# Patient Record
Sex: Female | Born: 1950
Health system: Southern US, Community
[De-identification: ages and names within clinical notes are randomized; demographics above are authoritative.]

## PROBLEM LIST (undated history)

## (undated) DIAGNOSIS — I10 Essential (primary) hypertension: Secondary | ICD-10-CM

## (undated) DIAGNOSIS — I639 Cerebral infarction, unspecified: Secondary | ICD-10-CM

## (undated) DIAGNOSIS — T7840XA Allergy, unspecified, initial encounter: Secondary | ICD-10-CM

## (undated) DIAGNOSIS — K219 Gastro-esophageal reflux disease without esophagitis: Secondary | ICD-10-CM

## (undated) DIAGNOSIS — B029 Zoster without complications: Secondary | ICD-10-CM

## (undated) DIAGNOSIS — R002 Palpitations: Secondary | ICD-10-CM

## (undated) DIAGNOSIS — IMO0002 Reserved for concepts with insufficient information to code with codable children: Secondary | ICD-10-CM

## (undated) DIAGNOSIS — G459 Transient cerebral ischemic attack, unspecified: Secondary | ICD-10-CM

## (undated) DIAGNOSIS — E063 Autoimmune thyroiditis: Secondary | ICD-10-CM

## (undated) DIAGNOSIS — E785 Hyperlipidemia, unspecified: Secondary | ICD-10-CM

## (undated) HISTORY — DX: Cerebral infarction, unspecified: I63.9

## (undated) HISTORY — DX: Essential (primary) hypertension: I10

## (undated) HISTORY — DX: Allergy, unspecified, initial encounter: T78.40XA

## (undated) HISTORY — DX: Autoimmune thyroiditis: E06.3

## (undated) HISTORY — DX: Transient cerebral ischemic attack, unspecified: G45.9

## (undated) HISTORY — DX: Hyperlipidemia, unspecified: E78.5

## (undated) HISTORY — DX: Gastro-esophageal reflux disease without esophagitis: K21.9

## (undated) HISTORY — PX: TUBAL LIGATION: SHX77

## (undated) HISTORY — DX: Zoster without complications: B02.9

## (undated) HISTORY — DX: Reserved for concepts with insufficient information to code with codable children: IMO0002

## (undated) HISTORY — DX: Palpitations: R00.2

## (undated) HISTORY — PX: CHOLECYSTECTOMY: SHX55

---

## 1991-12-19 ENCOUNTER — Encounter: Payer: Self-pay | Admitting: Cardiology

## 1992-01-01 ENCOUNTER — Encounter: Payer: Self-pay | Admitting: Cardiology

## 1997-05-06 ENCOUNTER — Other Ambulatory Visit: Admission: RE | Admit: 1997-05-06 | Discharge: 1997-05-06 | Payer: Self-pay | Admitting: Family Medicine

## 1997-05-07 ENCOUNTER — Other Ambulatory Visit: Admission: RE | Admit: 1997-05-07 | Discharge: 1997-05-07 | Payer: Self-pay | Admitting: Family Medicine

## 1998-02-05 ENCOUNTER — Ambulatory Visit (HOSPITAL_COMMUNITY): Admission: RE | Admit: 1998-02-05 | Discharge: 1998-02-05 | Payer: Self-pay | Admitting: Gastroenterology

## 1998-05-13 ENCOUNTER — Other Ambulatory Visit: Admission: RE | Admit: 1998-05-13 | Discharge: 1998-05-13 | Payer: Self-pay | Admitting: Family Medicine

## 1999-06-04 ENCOUNTER — Other Ambulatory Visit: Admission: RE | Admit: 1999-06-04 | Discharge: 1999-06-04 | Payer: Self-pay | Admitting: Family Medicine

## 1999-06-10 ENCOUNTER — Encounter: Payer: Self-pay | Admitting: Family Medicine

## 1999-06-10 ENCOUNTER — Encounter: Admission: RE | Admit: 1999-06-10 | Discharge: 1999-06-10 | Payer: Self-pay | Admitting: Family Medicine

## 1999-06-15 ENCOUNTER — Encounter: Payer: Self-pay | Admitting: Family Medicine

## 1999-06-15 ENCOUNTER — Encounter: Admission: RE | Admit: 1999-06-15 | Discharge: 1999-06-15 | Payer: Self-pay | Admitting: Family Medicine

## 2000-07-01 ENCOUNTER — Other Ambulatory Visit: Admission: RE | Admit: 2000-07-01 | Discharge: 2000-07-01 | Payer: Self-pay | Admitting: Family Medicine

## 2001-06-16 ENCOUNTER — Encounter: Payer: Self-pay | Admitting: Family Medicine

## 2001-06-16 ENCOUNTER — Encounter: Admission: RE | Admit: 2001-06-16 | Discharge: 2001-06-16 | Payer: Self-pay | Admitting: Family Medicine

## 2001-08-28 ENCOUNTER — Other Ambulatory Visit: Admission: RE | Admit: 2001-08-28 | Discharge: 2001-08-28 | Payer: Self-pay | Admitting: Family Medicine

## 2002-08-24 ENCOUNTER — Other Ambulatory Visit: Admission: RE | Admit: 2002-08-24 | Discharge: 2002-08-24 | Payer: Self-pay | Admitting: Family Medicine

## 2002-08-28 ENCOUNTER — Encounter: Payer: Self-pay | Admitting: Family Medicine

## 2002-08-28 ENCOUNTER — Encounter: Admission: RE | Admit: 2002-08-28 | Discharge: 2002-08-28 | Payer: Self-pay | Admitting: Family Medicine

## 2003-08-28 ENCOUNTER — Other Ambulatory Visit: Admission: RE | Admit: 2003-08-28 | Discharge: 2003-08-28 | Payer: Self-pay | Admitting: Family Medicine

## 2003-09-05 ENCOUNTER — Encounter: Admission: RE | Admit: 2003-09-05 | Discharge: 2003-09-05 | Payer: Self-pay | Admitting: Family Medicine

## 2003-09-13 ENCOUNTER — Ambulatory Visit (HOSPITAL_COMMUNITY): Admission: RE | Admit: 2003-09-13 | Discharge: 2003-09-13 | Payer: Self-pay | Admitting: Family Medicine

## 2003-09-13 ENCOUNTER — Encounter (INDEPENDENT_AMBULATORY_CARE_PROVIDER_SITE_OTHER): Payer: Self-pay | Admitting: Cardiology

## 2004-09-11 ENCOUNTER — Other Ambulatory Visit: Admission: RE | Admit: 2004-09-11 | Discharge: 2004-09-11 | Payer: Self-pay | Admitting: Family Medicine

## 2004-09-12 ENCOUNTER — Encounter: Admission: RE | Admit: 2004-09-12 | Discharge: 2004-09-12 | Payer: Self-pay | Admitting: Family Medicine

## 2005-02-18 ENCOUNTER — Encounter: Admission: RE | Admit: 2005-02-18 | Discharge: 2005-02-18 | Payer: Self-pay | Admitting: Family Medicine

## 2005-10-08 ENCOUNTER — Other Ambulatory Visit: Admission: RE | Admit: 2005-10-08 | Discharge: 2005-10-08 | Payer: Self-pay | Admitting: Family Medicine

## 2006-05-20 ENCOUNTER — Encounter: Admission: RE | Admit: 2006-05-20 | Discharge: 2006-05-20 | Payer: Self-pay | Admitting: Family Medicine

## 2006-11-11 ENCOUNTER — Other Ambulatory Visit: Admission: RE | Admit: 2006-11-11 | Discharge: 2006-11-11 | Payer: Self-pay | Admitting: Family Medicine

## 2007-06-02 ENCOUNTER — Encounter: Admission: RE | Admit: 2007-06-02 | Discharge: 2007-06-02 | Payer: Self-pay | Admitting: Family Medicine

## 2007-11-17 ENCOUNTER — Other Ambulatory Visit: Admission: RE | Admit: 2007-11-17 | Discharge: 2007-11-17 | Payer: Self-pay | Admitting: Family Medicine

## 2008-06-10 ENCOUNTER — Encounter: Admission: RE | Admit: 2008-06-10 | Discharge: 2008-06-10 | Payer: Self-pay | Admitting: Gastroenterology

## 2008-07-22 ENCOUNTER — Encounter: Admission: RE | Admit: 2008-07-22 | Discharge: 2008-07-22 | Payer: Self-pay | Admitting: General Surgery

## 2008-07-24 ENCOUNTER — Ambulatory Visit (HOSPITAL_BASED_OUTPATIENT_CLINIC_OR_DEPARTMENT_OTHER): Admission: RE | Admit: 2008-07-24 | Discharge: 2008-07-25 | Payer: Self-pay | Admitting: General Surgery

## 2008-07-24 ENCOUNTER — Encounter (INDEPENDENT_AMBULATORY_CARE_PROVIDER_SITE_OTHER): Payer: Self-pay | Admitting: General Surgery

## 2009-05-20 ENCOUNTER — Encounter (INDEPENDENT_AMBULATORY_CARE_PROVIDER_SITE_OTHER): Payer: Self-pay | Admitting: *Deleted

## 2009-09-17 ENCOUNTER — Ambulatory Visit: Payer: Self-pay | Admitting: Cardiology

## 2009-09-17 DIAGNOSIS — R0602 Shortness of breath: Secondary | ICD-10-CM | POA: Insufficient documentation

## 2009-09-17 DIAGNOSIS — E669 Obesity, unspecified: Secondary | ICD-10-CM

## 2009-09-17 DIAGNOSIS — I4949 Other premature depolarization: Secondary | ICD-10-CM

## 2009-10-16 ENCOUNTER — Encounter: Admission: RE | Admit: 2009-10-16 | Discharge: 2009-10-16 | Payer: Self-pay | Admitting: Family Medicine

## 2009-10-17 ENCOUNTER — Ambulatory Visit: Payer: Self-pay | Admitting: Cardiology

## 2009-10-17 ENCOUNTER — Ambulatory Visit: Payer: Self-pay

## 2009-11-03 ENCOUNTER — Ambulatory Visit: Payer: Self-pay | Admitting: Internal Medicine

## 2009-11-03 ENCOUNTER — Ambulatory Visit (HOSPITAL_COMMUNITY): Admission: RE | Admit: 2009-11-03 | Discharge: 2009-11-03 | Payer: Self-pay | Admitting: Cardiology

## 2009-11-03 ENCOUNTER — Encounter: Payer: Self-pay | Admitting: Cardiology

## 2009-11-03 ENCOUNTER — Ambulatory Visit: Payer: Self-pay

## 2009-11-06 ENCOUNTER — Ambulatory Visit: Payer: Self-pay | Admitting: Cardiology

## 2010-03-10 NOTE — Assessment & Plan Note (Signed)
Summary: Katie Allen   Visit Type:  Initial Consult Primary Provider:  Belva Agee, NP  CC:  Fatigue.  History of Present Illness: The patient presents for evaluation of fatigue. She also is concerned because her twin brother was recently diagnosed with a dilated cardiomyopathy after a syncopal episode. This was in another state. Her cardiac history has included PVCs documented on monitors in the past. She's had some mild bowing of the mitral valve without regurgitation in the past. However, she is otherwise had no significant cardiac abnormalities. She doesn't exercise routinely. However, doing activities such as vacuuming she has noticed that she is more fatigued over the past few months. She might get some leg tiredness as well. She does get some mild dyspnea with moderate activity but does not report resting shortness of breath, PND or orthopnea. She does not have palpitations, presyncope or syncope. She does not have chest pressure, neck or arm discomfort.  Current Medications (verified): 1)  Lisinopril-Hydrochlorothiazide 20-12.5 Mg Tabs (Lisinopril-Hydrochlorothiazide) .Marland Kitchen.. 1 By Mouth Daily 2)  Lipitor 10 Mg Tabs (Atorvastatin Calcium) .Marland Kitchen.. 1 By Mouth Daily 3)  Synthroid 50 Mcg Tabs (Levothyroxine Sodium) .Marland Kitchen.. 1 By Mouth Daily Alternating 1/2 By Mouth Daily 4)  Prilosec 20 Mg Cpdr (Omeprazole) .Marland Kitchen.. 1 By Mouth Daily 5)  Aspirin 81 Mg  Tabs (Aspirin) .Marland Kitchen.. 1 By Mouth Daily 6)  Caltrate 600+d 600-400 Mg-Unit Tabs (Calcium Carbonate-Vitamin D) .Marland Kitchen.. 1 Po Daily 7)  Vitamin C 500 Mg  Tabs (Ascorbic Acid) .Marland Kitchen.. 1 By Mouth Daily 8)  Vitamin B Complex-C   Caps (B Complex-C) .Marland Kitchen.. 1 By Mouth Daily 9)  Flax   Oil (Flaxseed (Linseed)) .Marland Kitchen.. 1 By Mouth Daily 10)  Vitamin D 400 Unit Tabs (Cholecalciferol) .... 2 By Mouth Daily  Allergies (verified): 1)  ! Penicillin  Past History:  Past Medical History: HTN x years Hyperlipidemia x years Hashimotos Thyroiditis Shingles  Past Surgical  History: Cholecystectomy  Family History: Father MI age 52, CABG in 106s Mother Colon CA Brother with cardiomyopathy age 31 Son with vasovagal syncope  Social History: Married , 48 year old son Never smoked Social EtOH  Review of Systems       Positive for joint pains, Jae Dire and dizziness with orthostatic symptoms, reflux. Otherwise as stated in the history of present illness and negative for all other systems.  Vital Signs:  Patient profile:   60 year old female Height:      71 inches Weight:      224 pounds BMI:     31.35 Pulse rate:   93 / minute Resp:     16 per minute BP sitting:   148 / 90  (right arm)  Vitals Entered By: Marrion Coy, CNA (September 17, 2009 3:02 PM)  Physical Exam  General:  Well developed, well nourished, in no acute distress. Head:  normocephalic and atraumatic Eyes:  PERRLA/EOM intact; conjunctiva and lids normal. Mouth:  Teeth, gums and palate normal. Oral mucosa normal. Neck:  Neck supple, no JVD. No masses, thyromegaly or abnormal cervical nodes. Chest Wall:  no deformities or breast masses noted Lungs:  Clear bilaterally to auscultation and percussion. Abdomen:  Bowel sounds positive; abdomen soft and non-tender without masses, organomegaly, or hernias noted. No hepatosplenomegaly. Msk:  Back normal, normal gait. Muscle strength and tone normal. Extremities:  No clubbing or cyanosis. Neurologic:  Alert and oriented x 3. Skin:  Intact without lesions or rashes. Cervical Nodes:  no significant adenopathy Axillary Nodes:  no significant adenopathy  Inguinal Nodes:  no significant adenopathy Psych:  Normal affect.   Detailed Cardiovascular Exam  Neck    Carotids: Carotids full and equal bilaterally without bruits.      Neck Veins: Normal, no JVD.    Heart    Inspection: no deformities or lifts noted.      Palpation: normal PMI with no thrills palpable.      Auscultation: regular rate and rhythm, S1, S2 without murmurs, rubs, gallops, or  clicks.    Vascular    Abdominal Aorta: no palpable masses, pulsations, or audible bruits.      Femoral Pulses: normal femoral pulses bilaterally.      Pedal Pulses: normal pedal pulses bilaterally.      Radial Pulses: normal radial pulses bilaterally.      Peripheral Circulation: no clubbing, cyanosis, or edema noted with normal capillary refill.     EKG  Procedure date:  09/17/2009  Findings:      sinus rhythm, rate 93, axis within normal limits, intervals within normal limits, sinus arrhythmia, no acute ST-T wave changes, premature ventricular contractions  Impression & Recommendations:  Problem # 1:  DYSPNEA (ICD-786.05) The patient has significant risk factors with a family history of coronary disease. Still I think the pretest probability of obstructive coronary disease is only low at best.  Exercise treadmill testing is indicated. This will allow me to risk stratify, rule out obstructive coronary disease and most importantly provide an exercise prescription. Orders: EKG w/ Interpretation (93000) Treadmill (Treadmill)  Problem # 2:  PREMATURE VENTRICULAR CONTRACTIONS (ICD-427.69) These are not particularly symptomatic. No change in therapy is indicated.  Problem # 3:  OBESITY, UNSPECIFIED (ICD-278.00) We discussed weight loss and I will give more specific suggestions at the time of the treadmill.  Problem # 4:  Family History of Cardiomyopathy I see no physical evidence of cardiomyopathy in this patient. She's had previous echocardiograms though a few years ago. I do not think further testing other than above is indicated.  Patient Instructions: 1)  Your physician recommends that you schedule a follow-up appointment at the time of the treadmill 2)  Your physician recommends that you continue on your current medications as directed. Please refer to the Current Medication list given to you today. 3)  Your physician has requested that you have an exercise tolerance test.  For  further information please visit [email or web address]  Please also follow instruction sheet, as given.

## 2010-03-10 NOTE — Letter (Signed)
Summary: [facility]  [facility]   Imported By: Roderic Ovens 09/26/2009 12:44:51  _____________________________________________________________________  External Attachment:    Type:   Image     Comment:   External Document

## 2010-03-10 NOTE — Letter (Signed)
Summary: [facility]   [facility]   Imported By: Roderic Ovens 09/26/2009 12:43:23  _____________________________________________________________________  External Attachment:    Type:   Image     Comment:   External Document

## 2010-03-10 NOTE — Assessment & Plan Note (Signed)
Summary: [facility]   Visit Type:  Follow-up Primary Provider:  Belva Agee, NP  CC:  Abnormal ETT.  History of Present Illness: The patient returns for followup of an abnormal stress test. She had a family history of sudden cardiac death in a brother who now has a defibrillator. I put her on a treadmill and other was no evidence of ischemia I was surprised to see how dyspneic she was in stage I and how rapidly she reached her target heart rate. She also had an accelerated blood pressure response. She did have an echocardiogram couple of days ago which demonstrates normal left and right ventricular function with no valvular abnormalities or significant findings. She does not have any chest pressure consistent with angina, neck or arm discomfort. She doesn't have any resting shortness of breath and denies any PND or orthopnea. She admittedly is sedentary and doesn't exercise at all. She is overweight and feels that she is deconditioned.  Current Medications (verified): 1)  Lisinopril-Hydrochlorothiazide 20-12.5 Mg Tabs (Lisinopril-Hydrochlorothiazide) .Marland Kitchen.. 1 By Mouth Daily 2)  Lipitor 10 Mg Tabs (Atorvastatin Calcium) .Marland Kitchen.. 1 By Mouth Daily 3)  Synthroid 50 Mcg Tabs (Levothyroxine Sodium) .Marland Kitchen.. 1 By Mouth Daily Alternating 1/2 By Mouth Daily 4)  Prilosec 20 Mg Cpdr (Omeprazole) .Marland Kitchen.. 1 By Mouth Daily 5)  Aspirin 81 Mg  Tabs (Aspirin) .Marland Kitchen.. 1 By Mouth Daily 6)  Caltrate 600+d 600-400 Mg-Unit Tabs (Calcium Carbonate-Vitamin D) .Marland Kitchen.. 1 Po Daily 7)  Vitamin C 500 Mg  Tabs (Ascorbic Acid) .Marland Kitchen.. 1 By Mouth Daily 8)  Vitamin B Complex-C   Caps (B Complex-C) .Marland Kitchen.. 1 By Mouth Daily 9)  Flax   Oil (Flaxseed (Linseed)) .Marland Kitchen.. 1 By Mouth Daily 10)  Vitamin D 400 Unit Tabs (Cholecalciferol) .... 2 By Mouth Daily 11)  Prempro 0.3-1.5 Mg Tabs (Conj Estrog-Medroxyprogest Ace) .Marland Kitchen.. 1 By Mouth Daily 12)  Multivitamins   Tabs (Multiple Vitamin) .Marland Kitchen.. 1 By Mouth Daily  Allergies (verified): 1)  !  Penicillin  Past History:  Past Medical History: Reviewed history from 09/17/2009 and no changes required. HTN x years Hyperlipidemia x years Hashimotos Thyroiditis Shingles  Past Surgical History: Reviewed history from 09/17/2009 and no changes required. Cholecystectomy  Review of Systems       As stated in the HPI and negative for all other systems.   Vital Signs:  Patient profile:   60 year old female Height:      71 inches Weight:      228 pounds BMI:     31.91 Pulse rate:   84 / minute Resp:     16 per minute BP sitting:   131 / 92  (right arm)  Vitals Entered By: Marrion Coy, CNA (November 06, 2009 11:46 AM)  Physical Exam  General:  Well developed, well nourished, in no acute distress. Head:  normocephalic and atraumatic Neck:  Neck supple, no JVD. No masses, thyromegaly or abnormal cervical nodes. Chest Wall:  no deformities Lungs:  Clear bilaterally to auscultation and percussion. Abdomen:  Bowel sounds positive; abdomen soft and non-tender without masses, organomegaly, or hernias noted. No hepatosplenomegaly. Msk:  Back normal, normal gait. Muscle strength and tone normal. Extremities:  No clubbing or cyanosis. Neurologic:  Alert and oriented x 3. Skin:  Intact without lesions or rashes. Cervical Nodes:  no significant adenopathy Inguinal Nodes:  no significant adenopathy Psych:  Normal affect.   Detailed Cardiovascular Exam  Neck    Carotids: Carotids full and equal bilaterally without bruits.  Neck Veins: Normal, no JVD.    Heart    Inspection: no deformities or lifts noted.      Palpation: normal PMI with no thrills palpable.      Auscultation: regular rate and rhythm, S1, S2 without murmurs, rubs, gallops, or clicks.    Vascular    Abdominal Aorta: no palpable masses, pulsations, or audible bruits.      Femoral Pulses: normal femoral pulses bilaterally.      Pedal Pulses: normal pedal pulses bilaterally.      Radial Pulses: normal  radial pulses bilaterally.      Peripheral Circulation: no clubbing, cyanosis, or edema noted with normal capillary refill.     Impression & Recommendations:  Problem # 1:  DYSPNEA (ICD-786.05) The patient's echo was normal. She has no significant symptoms. At this point I don't think further cardiovascular testing is suggested but I would like to reevaluate her if she has any future symptoms. Otherwise if there are no symptoms I will repeat a exercise tolerance test in one year. In the meantime we have discussed specific exercise regimen and plans to try to improve her conditioning. If she gets worse rather than better during this time she would need to come back and see me.  Problem # 2:  PREMATURE VENTRICULAR CONTRACTIONS (ICD-427.69)  Her updated medication list for this problem includes:    Lisinopril-hydrochlorothiazide 20-12.5 Mg Tabs (Lisinopril-hydrochlorothiazide) .Marland Kitchen... 1 by mouth daily    Aspirin 81 Mg Tabs (Aspirin) .Marland Kitchen... 1 by mouth daily  Problem # 3:  OBESITY, UNSPECIFIED (ICD-278.00) We discussed at length diet and exercise.  Patient Instructions: 1)  Your physician recommends that you schedule a follow-up appointment in 1 year for a treadmill 2)  Your physician recommends that you continue on your current medications as directed. Please refer to the Current Medication list given to you today.

## 2010-03-10 NOTE — Assessment & Plan Note (Signed)
Summary: per check f/u echo/saf   Current Medications (verified): 1)  Lisinopril-Hydrochlorothiazide 20-12.5 Mg Tabs (Lisinopril-Hydrochlorothiazide) .Marland Kitchen.. 1 By Mouth Daily 2)  Lipitor 10 Mg Tabs (Atorvastatin Calcium) .Marland Kitchen.. 1 By Mouth Daily 3)  Synthroid 50 Mcg Tabs (Levothyroxine Sodium) .Marland Kitchen.. 1 By Mouth Daily Alternating 1/2 By Mouth Daily 4)  Prilosec 20 Mg Cpdr (Omeprazole) .Marland Kitchen.. 1 By Mouth Daily 5)  Aspirin 81 Mg  Tabs (Aspirin) .Marland Kitchen.. 1 By Mouth Daily 6)  Caltrate 600+d 600-400 Mg-Unit Tabs (Calcium Carbonate-Vitamin D) .Marland Kitchen.. 1 Po Daily 7)  Vitamin C 500 Mg  Tabs (Ascorbic Acid) .Marland Kitchen.. 1 By Mouth Daily 8)  Vitamin B Complex-C   Caps (B Complex-C) .Marland Kitchen.. 1 By Mouth Daily 9)  Flax   Oil (Flaxseed (Linseed)) .Marland Kitchen.. 1 By Mouth Daily 10)  Vitamin D 400 Unit Tabs (Cholecalciferol) .... 2 By Mouth Daily 11)  Prempro 0.3-1.5 Mg Tabs (Conj Estrog-Medroxyprogest Ace) .Marland Kitchen.. 1 By Mouth Daily 12)  Multivitamins   Tabs (Multiple Vitamin) .Marland Kitchen.. 1 By Mouth Daily  Allergies (verified): 1)  ! Penicillin  Past History:  Past Medical History: Last updated: 09/17/2009 HTN x years Hyperlipidemia x years Hashimotos Thyroiditis Shingles  Vital Signs:  Patient profile:   60 year old female Height:      71 inches Weight:      228 pounds BMI:     31.91 Pulse rate:   84 / minute Resp:     16 per minute BP sitting:   131 / 92  (right arm)  Vitals Entered By: Marrion Coy, CNA (November 06, 2009 10:50 AM)

## 2010-03-10 NOTE — Letter (Signed)
Summary: Appointment - Missed  [facility]     [place], [place]    Phone:   Fax:      May 20, 2009 MRN: [id number]   VALORIA TAMBURRI [address] [place]  [address]   Dear Ms. Sanjurjo,  Our records indicate you missed your appointment on 05-14-2009   with  Dr. Antoine Poche  It is very important that we reach you to reschedule this appointment. We look forward to participating in your health care needs. Please contact us at the number listed above at your earliest convenience to reschedule this appointment.     Sincerely,      Lorne Skeens  [facility] Scheduling Team

## 2010-05-06 ENCOUNTER — Encounter: Payer: Self-pay | Admitting: Nurse Practitioner

## 2010-05-06 DIAGNOSIS — E063 Autoimmune thyroiditis: Secondary | ICD-10-CM | POA: Insufficient documentation

## 2010-05-06 DIAGNOSIS — R232 Flushing: Secondary | ICD-10-CM

## 2010-05-06 DIAGNOSIS — I10 Essential (primary) hypertension: Secondary | ICD-10-CM | POA: Insufficient documentation

## 2010-05-06 DIAGNOSIS — E039 Hypothyroidism, unspecified: Secondary | ICD-10-CM | POA: Insufficient documentation

## 2010-05-06 DIAGNOSIS — E785 Hyperlipidemia, unspecified: Secondary | ICD-10-CM | POA: Insufficient documentation

## 2010-05-06 DIAGNOSIS — E1169 Type 2 diabetes mellitus with other specified complication: Secondary | ICD-10-CM | POA: Insufficient documentation

## 2010-05-06 DIAGNOSIS — H8110 Benign paroxysmal vertigo, unspecified ear: Secondary | ICD-10-CM

## 2010-05-18 LAB — COMPREHENSIVE METABOLIC PANEL
ALT: 26 U/L (ref 0–35)
AST: 22 U/L (ref 0–37)
Albumin: 4 g/dL (ref 3.5–5.2)
CO2: 27 mEq/L (ref 19–32)
Calcium: 9.5 mg/dL (ref 8.4–10.5)
Creatinine, Ser: 0.68 mg/dL (ref 0.4–1.2)
GFR calc Af Amer: >60 mL/min (ref >60)
GFR calc non Af Amer: >60 mL/min (ref >60)
Sodium: 140 mEq/L (ref 135–145)
Total Protein: 6.6 g/dL (ref 6.0–8.3)

## 2010-05-18 LAB — CBC
MCHC: 33.8 g/dL (ref 30.0–36.0)
Platelets: 250 10*3/uL (ref 150–400)
RBC: 4.62 MIL/uL (ref 3.87–5.11)

## 2010-05-18 LAB — DIFFERENTIAL
Eosinophils Absolute: 0.2 10*3/uL (ref 0.0–0.7)
Eosinophils Relative: 2 % (ref 0–5)
Lymphocytes Relative: 40 % (ref 12–46)
Lymphs Abs: 3 10*3/uL (ref 0.7–4.0)
Monocytes Absolute: 0.4 10*3/uL (ref 0.1–1.0)
Monocytes Relative: 5 % (ref 3–12)

## 2010-06-23 NOTE — Op Note (Signed)
NAME:  Katie Allen             ACCOUNT NO.:  [id number]   MEDICAL RECORD NO.:  [id number]          PATIENT TYPE:  AMB   LOCATION:  [facility]                         FACILITY:  [facility]   PHYSICIAN:  Anselm Pancoast. Weatherly, M.D.DATE OF BIRTH:  23-Jul-1950   DATE OF PROCEDURE:  07/24/2008  DATE OF DISCHARGE:                               OPERATIVE REPORT   PREOPERATIVE DIAGNOSIS:  Chronic cholecystitis with stones.   POSTOPERATIVE DIAGNOSIS:  Chronic cholecystitis with stones.   OPERATION:  Laparoscopic cholecystectomy with cholangiogram.   ANESTHESIA:  General anesthesia.   HISTORY:  Katie Allen is a patient about 60 years old female  referred to me by the courtesy of Dr. Matthias Hughs, who had seen her referred  by Vernon Prey, her regular physician after episodes of epigastric pain.  She has had episodes of pain, previously had an ultrasound that did not  show gallstones and then recently with recurrent episodes, she had a  repeat ultrasound that showed multiple small stones within her  gallbladder.  She had a small bile duct on the ultrasound but when I  talked with her and I saw her about 3 or 4 weeks ago, she was having  episodes of pain and to me sounded as if she possibly could be passing  little stones.  However, her niece was getting married which she was  involved with that she definitely wanted to wait before until after the  wedding for the cholecystectomy.  She has been on a low-fat diet and  comes in today, repeat liver function studies are unremarkable.  Chest x-  ray and EKG was unremarkable.  She was given 400 mg of Cipro  intravenously and taken back to the operative suite.  The patient time-  out was performed after induction of general anesthesia.  They did have  to use the glide slide for placement in the endotracheal tube and Dr.  __________ placed this.  The abdomen was prepped with Betadine surgical  scrub and solution and draped in a sterile manner.  A small incision  was  made below the umbilicus.  The fascia was identified.  Small opening  made.  The underlying peritoneum was opened with a hemostat.  Pursestring suture of 0 Vicryl was placed and the Hasson cannula  introduced.  The gallbladder was distended but it did not have adhesions  around it.  The upper 10 mL trocar was placed in the subxiphoid area  after anesthetizing the fascia and the two lateral 5 mm trocars were  placed.  The gallbladder was retracted upward and outward.  There were  no adhesions and the peritoneum was encompassed I could see the little  cystic artery that was separated from the cystic duct, triply clipped  proximally, singly distally and divided it and then I encompassed the  cystic duct and put a clip the junction of the cystic duct and  gallbladder.  A small opening was made proximally, a Cook catheter was  introduced and upon opening the little cystic duct, there were 6 or 7  little about 1 mm stones expressed out of the cystic duct.  The  cholangiogram was inserted.  X-ray was obtained.  The common bile duct  is mildly dilated now compared to when she had the ultrasound but the  flow of the bile dye does go into the duodenum and I think that if there  is any little stones in the cystic duct, they should pass.  The catheter  was removed.  You could see the left and right radicals of the common  biliary system and I triply clipped the cystic duct, divided it.  Then  identified the posterior branch of the cystic artery.  This was clipped  and divided distal to the clips and then the gallbladder freed from its  bed.  Good hemostasis was obtained and we placed the gallbladder within  an EndoCatch bag.  The gallbladder and the bag was then removed from the  umbilical port.  I had thoroughly irrigated, aspirated and checked the  clips and they were all in good position, no bleeding.  I put an  additional figure-of-eight of 0 Vicryl in the fascia at the umbilicus  and then  anesthetized the fascia at the umbilicus.  All the ports had  been anesthetized prior to port placement.  Dr. Donell Beers, who was supposed  to assist, showed up really about the time the gallbladder was freed and  did not scrubbed in as things were going nicely.  We did put a single  figure-of-eight single fascia stitch in the subxiphoid port for  hemostasis and a little bit more Marcaine with adrenalin and then the  subcutaneous wounds were closed with 4-0 Vicryl.  Benzoin and Steri-  Strips on the skin. I think the patient is planning to spend the night.  All total about I think 20 mL of the Marcaine with adrenaline was used.  I gave her husband a sample of the little teeny stones that were in the  gallbladder like the ones in the cystic duct in case that she would have  an episode of pain.  In the next week or two I think that they will pass  but she may not be completely pain free for the first week or so.      Anselm Pancoast. Zachery Dakins, M.D.  Electronically Signed     WJW/MEDQ  D:  07/24/2008  T:  07/25/2008  Job:  [id number]   cc:   Ernestina Penna, M.D.  Fax: [phone]   Bernette Redbird, M.D.  Fax: [phone]

## 2010-09-15 ENCOUNTER — Encounter: Payer: Self-pay | Admitting: Cardiology

## 2010-10-14 ENCOUNTER — Other Ambulatory Visit: Payer: Self-pay | Admitting: Family Medicine

## 2010-10-14 DIAGNOSIS — Z1231 Encounter for screening mammogram for malignant neoplasm of breast: Secondary | ICD-10-CM

## 2010-10-28 ENCOUNTER — Ambulatory Visit
Admission: RE | Admit: 2010-10-28 | Discharge: 2010-10-28 | Disposition: A | Discharge status: Home / Self Care | Payer: BC Managed Care – PPO | Source: Ambulatory Visit | Attending: Family Medicine | Admitting: Family Medicine

## 2010-10-28 DIAGNOSIS — Z1231 Encounter for screening mammogram for malignant neoplasm of breast: Secondary | ICD-10-CM

## 2010-12-08 ENCOUNTER — Other Ambulatory Visit: Payer: Self-pay | Admitting: *Deleted

## 2010-12-08 ENCOUNTER — Telehealth: Payer: Self-pay | Admitting: *Deleted

## 2010-12-08 DIAGNOSIS — I493 Ventricular premature depolarization: Secondary | ICD-10-CM

## 2010-12-08 NOTE — Telephone Encounter (Signed)
Per Dr Antoine Poche pt needs a treadmill test.  Order placed and pt will be called with an appointment

## 2011-09-06 ENCOUNTER — Emergency Department (HOSPITAL_COMMUNITY): Payer: BC Managed Care – PPO

## 2011-09-06 ENCOUNTER — Inpatient Hospital Stay (HOSPITAL_COMMUNITY)
Admission: EM | Admit: 2011-09-06 | Discharge: 2011-09-07 | DRG: 832 | Disposition: A | Discharge status: Home / Self Care | Payer: BC Managed Care – PPO | Attending: Internal Medicine | Admitting: Internal Medicine

## 2011-09-06 ENCOUNTER — Encounter (HOSPITAL_COMMUNITY): Payer: Self-pay

## 2011-09-06 DIAGNOSIS — G459 Transient cerebral ischemic attack, unspecified: Principal | ICD-10-CM | POA: Diagnosis present

## 2011-09-06 DIAGNOSIS — Z880 Allergy status to penicillin: Secondary | ICD-10-CM

## 2011-09-06 DIAGNOSIS — Z9089 Acquired absence of other organs: Secondary | ICD-10-CM

## 2011-09-06 DIAGNOSIS — E039 Hypothyroidism, unspecified: Secondary | ICD-10-CM | POA: Diagnosis present

## 2011-09-06 DIAGNOSIS — I10 Essential (primary) hypertension: Secondary | ICD-10-CM | POA: Diagnosis present

## 2011-09-06 DIAGNOSIS — E785 Hyperlipidemia, unspecified: Secondary | ICD-10-CM | POA: Diagnosis present

## 2011-09-06 LAB — POCT I-STAT, CHEM 8
Calcium, Ion: 1.22 mmol/L (ref 1.13–1.30)
Glucose, Bld: 94 mg/dL (ref 70–99)
HCT: 41 % (ref 36.0–46.0)
Hemoglobin: 13.9 g/dL (ref 12.0–15.0)
Sodium: 142 mEq/L (ref 135–145)

## 2011-09-06 LAB — COMPREHENSIVE METABOLIC PANEL
CO2: 25 mEq/L (ref 19–32)
Calcium: 9.6 mg/dL (ref 8.4–10.5)
Creatinine, Ser: 0.63 mg/dL (ref 0.50–1.10)
GFR calc Af Amer: >90 mL/min (ref >90)
GFR calc non Af Amer: >90 mL/min (ref >90)
Glucose, Bld: 95 mg/dL (ref 70–99)

## 2011-09-06 LAB — CBC
HCT: 41.4 % (ref 36.0–46.0)
MCV: 87.3 fL (ref 78.0–100.0)
RBC: 4.74 MIL/uL (ref 3.87–5.11)
RDW: 13.6 % (ref 11.5–15.5)
WBC: 8.2 10*3/uL (ref 4.0–10.5)

## 2011-09-06 LAB — DIFFERENTIAL
Eosinophils Relative: 2 % (ref 0–5)
Lymphocytes Relative: 47 % — ABNORMAL HIGH (ref 12–46)
Lymphs Abs: 3.8 10*3/uL (ref 0.7–4.0)
Monocytes Absolute: 0.6 10*3/uL (ref 0.1–1.0)

## 2011-09-06 LAB — CK TOTAL AND CKMB (NOT AT ARMC)
CK, MB: 1.7 ng/mL (ref 0.3–4.0)
Relative Index: INVALID (ref 0.0–2.5)

## 2011-09-06 MED ORDER — LISINOPRIL 20 MG PO TABS
20.0000 mg | ORAL_TABLET | Freq: Every day | ORAL | Status: DC
  Administered 2011-09-07: 20 mg via ORAL
  Filled 2011-09-06 (×4): qty 1

## 2011-09-06 MED ORDER — LEVOTHYROXINE SODIUM 25 MCG PO TABS
25.0000 ug | ORAL_TABLET | ORAL | Status: DC
  Filled 2011-09-06: qty 1

## 2011-09-06 MED ORDER — ESTROGENS CONJUGATED 0.3 MG PO TABS
0.3000 mg | ORAL_TABLET | Freq: Every day | ORAL | Status: DC
  Administered 2011-09-07: 0.3 mg via ORAL
  Filled 2011-09-06: qty 1

## 2011-09-06 MED ORDER — B COMPLEX-C PO TABS
1.0000 | ORAL_TABLET | Freq: Every day | ORAL | Status: DC
  Administered 2011-09-06 – 2011-09-07 (×2): 1 via ORAL
  Filled 2011-09-06 (×3): qty 1

## 2011-09-06 MED ORDER — CONJ ESTROG-MEDROXYPROGEST ACE 0.3-1.5 MG PO TABS: 1.0000 | ORAL_TABLET | Freq: Every day | ORAL | Status: DC

## 2011-09-06 MED ORDER — LEVOTHYROXINE SODIUM 50 MCG PO TABS
50.0000 ug | ORAL_TABLET | ORAL | Status: DC
  Administered 2011-09-07: 50 ug via ORAL
  Filled 2011-09-06: qty 1

## 2011-09-06 MED ORDER — ONE-DAILY MULTI VITAMINS PO TABS: 1.0000 | ORAL_TABLET | Freq: Every day | ORAL | Status: DC

## 2011-09-06 MED ORDER — VITAMIN D3 25 MCG (1000 UNIT) PO TABS
2000.0000 [IU] | ORAL_TABLET | Freq: Every day | ORAL | Status: DC
  Administered 2011-09-06 – 2011-09-07 (×2): 2000 [IU] via ORAL
  Filled 2011-09-06 (×4): qty 2

## 2011-09-06 MED ORDER — CLOPIDOGREL BISULFATE 75 MG PO TABS
75.0000 mg | ORAL_TABLET | Freq: Every day | ORAL | Status: DC
  Administered 2011-09-06 – 2011-09-07 (×2): 75 mg via ORAL
  Filled 2011-09-06 (×3): qty 1

## 2011-09-06 MED ORDER — ENOXAPARIN SODIUM 40 MG/0.4ML ~~LOC~~ SOLN
40.0000 mg | SUBCUTANEOUS | Status: DC
  Administered 2011-09-06: 40 mg via SUBCUTANEOUS
  Filled 2011-09-06 (×3): qty 0.4

## 2011-09-06 MED ORDER — HYDROCHLOROTHIAZIDE 12.5 MG PO CAPS
12.5000 mg | ORAL_CAPSULE | Freq: Every day | ORAL | Status: DC
  Administered 2011-09-07: 12.5 mg via ORAL
  Filled 2011-09-06 (×3): qty 1

## 2011-09-06 MED ORDER — MEDROXYPROGESTERONE ACETATE 2.5 MG PO TABS
1.2500 mg | ORAL_TABLET | Freq: Every day | ORAL | Status: DC
  Administered 2011-09-07: 09:00:00 via ORAL
  Filled 2011-09-06: qty 0.5

## 2011-09-06 MED ORDER — SENNOSIDES-DOCUSATE SODIUM 8.6-50 MG PO TABS: 1.0000 | ORAL_TABLET | Freq: Every evening | ORAL | Status: DC | PRN

## 2011-09-06 MED ORDER — VITAMIN C 500 MG PO TABS
500.0000 mg | ORAL_TABLET | Freq: Every day | ORAL | Status: DC
  Administered 2011-09-07: 500 mg via ORAL
  Filled 2011-09-06 (×2): qty 1

## 2011-09-06 MED ORDER — LEVOTHYROXINE SODIUM 25 MCG PO TABS: 25.0000 ug | ORAL_TABLET | Freq: Every day | ORAL | Status: DC

## 2011-09-06 MED ORDER — LISINOPRIL-HYDROCHLOROTHIAZIDE 20-12.5 MG PO TABS: 1.0000 | ORAL_TABLET | Freq: Every day | ORAL | Status: DC

## 2011-09-06 MED ORDER — ADULT MULTIVITAMIN W/MINERALS CH
1.0000 | ORAL_TABLET | Freq: Every day | ORAL | Status: DC
  Administered 2011-09-07: 1 via ORAL
  Filled 2011-09-06: qty 1

## 2011-09-06 MED ORDER — SIMVASTATIN 10 MG PO TABS
10.0000 mg | ORAL_TABLET | Freq: Every day | ORAL | Status: DC
  Filled 2011-09-06: qty 1

## 2011-09-06 MED ORDER — B COMPLEX PO TABS: 1.0000 | ORAL_TABLET | Freq: Every day | ORAL | Status: DC

## 2011-09-06 MED ORDER — CHOLECALCIFEROL 50 MCG (2000 UT) PO CAPS: 2000.0000 [IU] | ORAL_CAPSULE | Freq: Every day | ORAL | Status: DC

## 2011-09-06 NOTE — Consult Note (Signed)
Referring Physician: Dr. Anitra Lauth    Chief Complaint: Right facial droop and left-sided numbness.  HPI: Katie Allen is an 61 y.o. female with a history of hypertension and hyperlipidemia who was brought to the emergency room and code stroke status with a history of weakness involving right lower face. Patient was last known normal at about 7:30 this morning. She lived in [place] about 10 AM and so drooping of her right lower face, which has since resolved. She had no speech changes nor any right upper extremity symptoms. Sensory changes involving her left side have been present for at least one month and were unchanged today. Still previous history of stroke nor TIA. She's been on aspirin 81 mg per day. CT scan of her head showed no acute intracranial abnormality. NIH stroke scale was 1 for numbness on the left side.  LSN: 7:30 AM today tPA Given: No: Rapid resolution of deficit MRankin: 0  Past Medical History  Diagnosis Date  . Hypertension     x years  . Hyperlipidemia     x years  . Hashimoto's thyroiditis   . Shingles     Family History  Problem Relation Age of Onset  . Colon cancer Mother   . Heart attack Father 79    CABG in his 84s  . Cardiomyopathy Brother 63     Medications:  Prior to Admission:  Aspirin 81 mg per day Vitamin C 500 mg per day Lipitor 10 mg per day B. complex vitamins one tablet per day Calcium with vitamin D one per day Vitamin D3 2000 units per day Prempro one tablet per day Omega-3 fatty acid 1000 mg per day Flaxseed-linseed oil one per day Synthroid 50 mcg per day Lisinopril-hydrochlorothiazide 20-12.5 one per day Multivitamin 1 per day Prilosec 20 mg per day   Physical Examination: Blood pressure 140/75, pulse 86, temperature 97.7 F (36.5 C), temperature source Oral, resp. rate 20, SpO2 98.00%.  Neurologic Examination: Mental Status: Alert, oriented, thought content appropriate.  Speech fluent without evidence of aphasia. Able to  follow commands without difficulty. Cranial Nerves: II-Visual fields were normal. III/IV/VI-Pupils were equal and reacted. Extraocular movements were full and conjugate.    V/VII-no facial numbness and no facial weakness. VIII-normal. X-normal speech and symmetrical palatal movement. XII-midline tongue extension Motor: 5/5 bilaterally with normal tone and bulk Sensory: Reduced perception of tactile sensation over entire left side. Deep Tendon Reflexes: 2+ and symmetric. Plantars: Mute bilaterally Cerebellar: Normal finger-to-nose and heel-to-shin testing bilaterally. Carotid auscultation: Normal   Ct Head Wo Contrast  09/06/2011  *RADIOLOGY REPORT*  Clinical Data: Code stroke, right facial droop  CT HEAD WITHOUT CONTRAST  Technique:  Contiguous axial images were obtained from the base of the skull through the vertex without contrast.  Comparison: MRI brain dated 09/12/2004  Findings: No evidence of parenchymal hemorrhage or extra-axial fluid collection. No mass lesion, mass effect, or midline shift.  No CT evidence of acute infarction.  Subcortical white matter and periventricular small vessel ischemic changes.  Mild global cortical atrophy.  No ventriculomegaly.  The visualized paranasal sinuses are essentially clear. The mastoid air cells are unopacified.  No evidence of calvarial fracture.  IMPRESSION: No evidence of acute intracranial abnormality.  Atrophy with small vessel ischemic changes.  Original Report Authenticated By: Charline Bills, M.D.    Assessment: 62 y.o. female  presenting with possible left subcortical small vessel TIA transient right facial weakness. Etiology for sensory changes on the left side is unclear.  Stroke Risk Factors -  family history, hyperlipidemia and hypertension  Plan: 1. HgbA1c, fasting lipid panel 2. MRI, MRA  of the brain without contrast 3. Echocardiogram 4. Carotid dopplers 5. Prophylactic therapy-Antiplatelet med: Plavix 75 mg per day 6. Risk  factor modification 7. Telemetry monitoring  C.R. Roseanne Reno, MD [facility] [phone]   09/06/2011, 12:23 PM

## 2011-09-06 NOTE — H&P (Signed)
Hospital Admission Note Date: 09/06/2011  Patient name: Katie Allen Medical record number: [id number] Date of birth: [birth year] Age: 61 y.o. Gender: female PCP: No primary provider on file.  Medical Service: [facility] Attending physician:  Doneen Poisson    1st Contact: Bronson Curb  Pager: [phone] 2nd Contact: Lyn Hollingshead   Pager: [phone] After 5 pm or weekends: 1st Contact: Intern pager   Pager: [phone] 2nd Contact:  Resident pager  Pager: [phone]  Chief Complaint: R sided weakness  History of Present Illness: Katie Allen is a 61 yo F w pmh of HTN and hyperlipidemia presenting to the ED w history of R-sided lower facial weakness starting this morning. Pt noticed R sided facial droop this morning at 10:00. She did not have any headache, aphasia, confusion, or acute lower extremity weakness. She was brought to the ED and code stroke was called. By the time she arrived at the ED, her symptoms had resolved. TPa was not given. NIHSS was 1 for numbness on the left side, which pt reports has been present over "the last few weeks." She perceives decreased sensation over L arm and leg with no associated weakness. She has no previous history of weakness, numbness, TIA or stroke. She takes 81mg  of ASA. Her stroke risk factors include family history, hypertension, and hyperlipidemia.   Meds: Current Outpatient Rx  Name Route Sig Dispense Refill  . VITAMIN C 500 MG PO TABS Oral Take 500 mg by mouth daily.      . ASPIRIN 81 MG PO TBEC Oral Take 81 mg by mouth daily.      . B COMPLEX PO TABS Oral Take 1 tablet by mouth daily.      . CHOLECALCIFEROL 2000 UNITS PO CAPS Oral Take by mouth.      Marland Kitchen CONJ ESTROG-MEDROXYPROGEST ACE 0.3-1.5 MG PO TABS Oral Take 1 tablet by mouth daily.      Marland Kitchen LEVOTHYROXINE SODIUM 50 MCG PO TABS Oral Take 25-50 mcg by mouth daily. Alternates and 50 mcg daily    . LISINOPRIL-HYDROCHLOROTHIAZIDE 20-12.5 MG PO TABS Oral Take 1 tablet by mouth daily.      Marland Kitchen  ONE-DAILY MULTI VITAMINS PO TABS Oral Take 1 tablet by mouth daily.      Marland Kitchen PRAVASTATIN SODIUM 20 MG PO TABS Oral Take 20 mg by mouth daily.      Allergies: Allergies as of 09/06/2011 - Review Complete 09/06/2011  Allergen Reaction Noted  . Penicillins Swelling    Past Medical History  Diagnosis Date  . Hypertension     x years  . Hyperlipidemia     x years  . Hashimoto's thyroiditis   . Shingles    Past Surgical History  Procedure Date  . Cholecystectomy    Family History  Problem Relation Age of Onset  . Colon cancer Mother   . Heart attack Father 67    CABG in his 22s  . Cardiomyopathy Brother 67   History   Social History  . Marital Status: Married    Spouse Name: N/A    Number of Children: 1  . Years of Education: N/A   Occupational History  . Not on file.   Social History Main Topics  . Smoking status: Never Smoker   . Smokeless tobacco: Not on file  . Alcohol Use: No     Social  . Drug Use: No  . Sexually Active:    Other Topics Concern  . Not on file   Social History Narrative  Has a 26 year old son.    Review of Systems: 10pt ROS negative except as per HPI  Physical Exam: Blood pressure 130/81, pulse 77, temperature 98 F (36.7 C), temperature source Oral, resp. rate 21, SpO2 98.00%. General: resting in bed, smiling, NAD HEENT: PERRL, EOMI, no scleral icterus Cardiac: RRR, no rubs, murmurs or gallops Pulm: clear to auscultation bilaterally, no wheezes, rales, or rhonchi Abd: soft, nontender, nondistended, BS present Ext: warm and well perfused, no pedal edema Neurological: A&O x3, Strength is normal and symmetric bilaterally, cranial nerve II-XII are  intact, no focal motor deficit. On our exam, sensory intact to light touch bilaterally. Pain/temp testing not performed. Psychiatric: Normal mood and affect. speech and behavior is normal. Judgment and thought content normal. Cognition and memory are normal.    Lab results: Basic Metabolic  Panel:  Basename 09/06/11 1210 09/06/11 1201  NA 142 142  K 3.4* 3.3*  CL 106 106  CO2 -- 25  GLUCOSE 94 95  BUN 16 16  CREATININE 0.80 0.63  CALCIUM -- 9.6  MG -- --  PHOS -- --   Liver Function Tests:  Basename 09/06/11 1201  AST 16  ALT 22  ALKPHOS 76  BILITOT 0.4  PROT 6.9  ALBUMIN 3.9   CBC:  Basename 09/06/11 1210 09/06/11 1201  WBC -- 8.2  NEUTROABS -- 3.7  HGB 13.9 14.0  HCT 41.0 41.4  MCV -- 87.3  PLT -- 252   Cardiac Enzymes:  Basename 09/06/11 1205  CKTOTAL 69  CKMB 1.7  CKMBINDEX --  TROPONINI <0.30   Coagulation:  Basename 09/06/11 1201  LABPROT 14.4  INR 1.10   Imaging results:  Ct Head Wo Contrast  09/06/2011  *RADIOLOGY REPORT*  Clinical Data: Code stroke, right facial droop  CT HEAD WITHOUT CONTRAST  Technique:  Contiguous axial images were obtained from the base of the skull through the vertex without contrast.  Comparison: MRI brain dated 09/12/2004  Findings: No evidence of parenchymal hemorrhage or extra-axial fluid collection. No mass lesion, mass effect, or midline shift.  No CT evidence of acute infarction.  Subcortical white matter and periventricular small vessel ischemic changes.  Mild global cortical atrophy.  No ventriculomegaly.  The visualized paranasal sinuses are essentially clear. The mastoid air cells are unopacified.  No evidence of calvarial fracture.  IMPRESSION: No evidence of acute intracranial abnormality.  Atrophy with small vessel ischemic changes.  Original Report Authenticated By: Charline Bills, M.D.   Mr [facility] Wo Contrast  09/06/2011  *RADIOLOGY REPORT*  Clinical Data:  Right-sided facial droop.  Hypertension.  Acute but ill-defined vascular disease.  MRI HEAD WITHOUT CONTRAST MRA HEAD WITHOUT CONTRAST  Technique:  Multiplanar, multiecho pulse sequences of the brain and surrounding structures were obtained without intravenous contrast. Angiographic images of the head were obtained using MRA technique without  contrast.  Comparison:  09/06/2011  MRI HEAD  Findings:  Diffusion imaging does not show any acute or subacute infarction.  The brainstem and cerebellum are normal.  The cerebral hemispheres show scattered foci of T2 and FLAIR signal within the subcortical and deep white matter consistent with chronic small vessel disease.  No cortical or large vessel territory infarction. No mass lesion, hemorrhage, hydrocephalus or extra-axial collection.  No pituitary mass.  No inflammatory sinus disease.  No skull or skull base lesion.  IMPRESSION: No acute finding.  Chronic small vessel change of the hemispheric white matter.  MRA HEAD  Findings: Both internal carotid arteries are widely patent into the  brain.  No siphon stenosis.  The anterior and middle cerebral vessels are patent without proximal stenosis, aneurysm or vascular malformation.  Both vertebral arteries are patent to the basilar. No basilar stenosis.  Posterior circulation branch capsules are normal.  IMPRESSION: Normal intracranial MR angiography of the large and medium-sized vessels.  Original Report Authenticated By: Thomasenia Sales, M.D.   Mr Brain Wo Contrast  09/06/2011  *RADIOLOGY REPORT*  Clinical Data:  Right-sided facial droop.  Hypertension.  Acute but ill-defined vascular disease.  MRI HEAD WITHOUT CONTRAST MRA HEAD WITHOUT CONTRAST  Technique:  Multiplanar, multiecho pulse sequences of the brain and surrounding structures were obtained without intravenous contrast. Angiographic images of the head were obtained using MRA technique without contrast.  Comparison:  09/06/2011  MRI HEAD  Findings:  Diffusion imaging does not show any acute or subacute infarction.  The brainstem and cerebellum are normal.  The cerebral hemispheres show scattered foci of T2 and FLAIR signal within the subcortical and deep white matter consistent with chronic small vessel disease.  No cortical or large vessel territory infarction. No mass lesion, hemorrhage, hydrocephalus  or extra-axial collection.  No pituitary mass.  No inflammatory sinus disease.  No skull or skull base lesion.  IMPRESSION: No acute finding.  Chronic small vessel change of the hemispheric white matter.  MRA HEAD  Findings: Both internal carotid arteries are widely patent into the brain.  No siphon stenosis.  The anterior and middle cerebral vessels are patent without proximal stenosis, aneurysm or vascular malformation.  Both vertebral arteries are patent to the basilar. No basilar stenosis.  Posterior circulation branch capsules are normal.  IMPRESSION: Normal intracranial MR angiography of the large and medium-sized vessels.  Original Report Authenticated By: Thomasenia Sales, M.D.    Other results: ECG Interpretation  Date: 09/06/2011  Rate: 85  Rhythm: normal sinus rhythm,  normal EKG, normal sinus rhythm, unchanged from previous tracings  Axis: normal  Intervals:none  ST&T Change: none  Narrative Interpretation: normal sinus rhythm    Assessment & Plan by Problem: 1. TIA Pt is a 61 yo F w transient R sided facial droop and subacute decreased sensation of L arm and leg. Her facial droop had resolved by the time of presentation to the ED. She was not given tPA. CT and MRI of head negative for acute stroke.  She was evaluated by Dr. Roseanne Reno who recommended admission for further workup in light of strong family history and risk factors. The etiology of persistent L sided numbness is not clear at this point. Her ABCD2 score is 5 (moderate risk: 90d stroke risk 9.8%).  - Echo and carotid dopplers pending - Plavix 75mg  qd - HbA1c, fasting lipid panel pending -Telemetry overnight.  - Neurology is following, appreciate recs  2. HTN No acute stroke. Will restart BP meds. -Lisinopril, HCTZ  3. Hypothyroidism Pt w h/o hypothyroidism.  - Continue Synthroid  4. Hyperlipidemia Fasting lipid panel pending.  - Continue home simvastatin  Signed: Bronson Curb 09/06/2011, 4:03 PM

## 2011-09-06 NOTE — ED Provider Notes (Signed)
History     CSN: [id number]  Arrival date & time 09/06/11  1147   First MD Initiated Contact with Patient 09/06/11 1157      No chief complaint on file.   (Consider location/radiation/quality/duration/timing/severity/associated sxs/prior treatment) Patient is a 61 y.o. female presenting with weakness. The history is provided by the patient.  Weakness The primary symptoms include headaches and focal weakness. Progression since onset: Facial droop has resolved on arrival here. Numbness and weakness in her left side is unchanged. The neurological symptoms are focal.  The headache is associated with weakness. The headache is not associated with neck stiffness or loss of balance.  Additional symptoms include weakness. Additional symptoms do not include neck stiffness, pain, lower back pain, leg pain, loss of balance, taste disturbance, hyperacusis, tinnitus or anxiety. Associated symptoms comments: States she's having mild difficulty walking due to 2 mild drop of her left foot. Medical issues also include hypertension. Medical issues do not include cerebral vascular accident. Associated medical issues comments: Strong family history of stroke.    Past Medical History  Diagnosis Date  . Hypertension     x years  . Hyperlipidemia     x years  . Hashimoto's thyroiditis   . Shingles     Past Surgical History  Procedure Date  . Cholecystectomy     Family History  Problem Relation Age of Onset  . Colon cancer Mother   . Heart attack Father 26    CABG in his 81s  . Cardiomyopathy Brother 41    History  Substance Use Topics  . Smoking status: Never Smoker   . Smokeless tobacco: Not on file  . Alcohol Use: Yes     Social    OB History    Grav Para Term Preterm Abortions TAB SAB Ect Mult Living                  Review of Systems  HENT: Negative for neck stiffness and tinnitus.   Neurological: Positive for focal weakness, weakness and headaches. Negative for loss of  balance.  All other systems reviewed and are negative.    Allergies  Penicillins  Home Medications   Current Outpatient Rx  Name Route Sig Dispense Refill  . VITAMIN C 500 MG PO TABS Oral Take 500 mg by mouth daily.      . ASPIRIN 81 MG PO TBEC Oral Take 81 mg by mouth daily.      . ATORVASTATIN CALCIUM 10 MG PO TABS Oral Take 10 mg by mouth daily.      . B COMPLEX PO TABS Oral Take 1 tablet by mouth daily.      Marland Kitchen CALTRATE 600 PLUS-VIT D PO Oral Take by mouth.      . CHOLECALCIFEROL 2000 UNITS PO CAPS Oral Take by mouth.      Marland Kitchen CONJ ESTROG-MEDROXYPROGEST ACE 0.3-1.5 MG PO TABS Oral Take 1 tablet by mouth daily.      . OMEGA-3 FATTY ACIDS 1000 MG PO CAPS Oral Take 1 g by mouth daily. Takes 2 daily     . FLAXSEED OIL PO Oral Take 1 tablet by mouth daily.      Marland Kitchen LEVOTHYROXINE SODIUM 50 MCG PO TABS Oral Take 50 mcg by mouth daily.      Marland Kitchen LISINOPRIL-HYDROCHLOROTHIAZIDE 20-12.5 MG PO TABS Oral Take 1 tablet by mouth daily.      Marland Kitchen ONE-DAILY MULTI VITAMINS PO TABS Oral Take 1 tablet by mouth daily.      Marland Kitchen  OMEPRAZOLE 20 MG PO CPDR Oral Take 20 mg by mouth daily.        There were no vitals taken for this visit.  Physical Exam  Nursing note and vitals reviewed. Constitutional: She is oriented to person, place, and time. She appears well-developed and well-nourished. No distress.  HENT:  Head: Normocephalic and atraumatic.  Mouth/Throat: Oropharynx is clear and moist.  Eyes: Conjunctivae and EOM are normal. Pupils are equal, round, and reactive to light.  Neck: Normal range of motion. Neck supple.  Cardiovascular: Normal rate, regular rhythm and intact distal pulses.   No murmur heard. Pulmonary/Chest: Effort normal and breath sounds normal. No respiratory distress. She has no wheezes. She has no rales.  Abdominal: Soft. She exhibits no distension. There is no tenderness. There is no rebound and no guarding.  Musculoskeletal: Normal range of motion. She exhibits no edema and no  tenderness.  Neurological: She is alert and oriented to person, place, and time. No cranial nerve deficit or sensory deficit. Coordination normal.       Mild subtle decreased strength in the left upper extremity hand grip. Otherwise strength appears intact bilaterally  Skin: Skin is warm and dry. No rash noted. No erythema.  Psychiatric: She has a normal mood and affect. Her behavior is normal.    ED Course  Procedures (including critical care time)  Labs Reviewed  DIFFERENTIAL - Abnormal; Notable for the following:    Lymphocytes Relative 47 (*)     All other components within normal limits  COMPREHENSIVE METABOLIC PANEL - Abnormal; Notable for the following:    Potassium 3.3 (*)     All other components within normal limits  POCT I-STAT, CHEM 8 - Abnormal; Notable for the following:    Potassium 3.4 (*)     All other components within normal limits  PROTIME-INR  APTT  CBC  CK TOTAL AND CKMB  TROPONIN I  POCT I-STAT TROPONIN I   Ct Head Wo Contrast  09/06/2011  *RADIOLOGY REPORT*  Clinical Data: Code stroke, right facial droop  CT HEAD WITHOUT CONTRAST  Technique:  Contiguous axial images were obtained from the base of the skull through the vertex without contrast.  Comparison: MRI brain dated 09/12/2004  Findings: No evidence of parenchymal hemorrhage or extra-axial fluid collection. No mass lesion, mass effect, or midline shift.  No CT evidence of acute infarction.  Subcortical white matter and periventricular small vessel ischemic changes.  Mild global cortical atrophy.  No ventriculomegaly.  The visualized paranasal sinuses are essentially clear. The mastoid air cells are unopacified.  No evidence of calvarial fracture.  IMPRESSION: No evidence of acute intracranial abnormality.  Atrophy with small vessel ischemic changes.  Original Report Authenticated By: Charline Bills, M.D.     Date: 09/06/2011  Rate: 85  Rhythm: normal sinus rhythm  QRS Axis: normal  Intervals: normal   ST/T Wave abnormalities: normal  Conduction Disutrbances: none  Narrative Interpretation: unremarkable     1. TIA (transient ischemic attack)   2. Hypertension       MDM   Patient initially presented as a code stroke due to right-sided facial droop and left-sided weakness. However on arrival here her right-sided facial droop had resolved and her left-sided weakness in walking difficulty has been ongoing for the last 2 weeks. Neurology at bedside but code stroke was canceled due to length the symptoms. Head CT was unremarkable. Patient does have multiple risk factors and per neurology's request will be admitted for further TIA and  stroke workup.  1:23 PM Pt with relatively normal labs.  Will admit for TIA workup.     Gwyneth Sprout, MD 09/06/11 1324

## 2011-09-06 NOTE — ED Notes (Signed)
MD and RRN at bedside for assessment

## 2011-09-06 NOTE — ED Notes (Signed)
Pt has had abnormal gait per husband for few weeks and numbness on left side of face for few weeks.  Per pt, pt saw self normal at 0700 today and noticed rt facial droop today at 1000.

## 2011-09-06 NOTE — Code Documentation (Signed)
61 year old female presented to ED via EMS with stroke symptoms.  Patient reports she woke at 0530 this AM and all was normal - except some left side numbness face to toes that has been present for about a month.  She put her make up on at 0700 and all was the same.  Went to work and at 1000 went to restroom where she saw self in [employer] and noticed significant right facial droop.Denies any different feeling right side on face and only noticed it when looked in mirror - last time she saw face was at 0700 with putting on makeup.  No one if office reported facial droop before patient noticed it.  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  EMS was called and Code Stroke initiated.  Patient arrived to ED at 1147 - stroke team arrival 1123 - EDP exam at 50 - to CT scan at 35.  LSW 0700.  NIHSS 1 - numbness to left side which patient reports is NOT new -has been there for about one month.  Patient also reports that her husband has been commenting on her unsteady gait in early mornings for the past 2 weeks.  Code stroke cancelled at 1210 - not tpa candidate.  Dr. Roseanne Reno present. Spoke with Dr. Pearlean Brownie re: possible research candidate.

## 2011-09-06 NOTE — ED Notes (Signed)
Patient transported to MRI 

## 2011-09-06 NOTE — ED Notes (Signed)
Admitting MD at bedside.

## 2011-09-07 DIAGNOSIS — I517 Cardiomegaly: Secondary | ICD-10-CM

## 2011-09-07 LAB — HEMOGLOBIN A1C
Hgb A1c MFr Bld: 6 % — ABNORMAL HIGH (ref <5.7)
Mean Plasma Glucose: 126 mg/dL — ABNORMAL HIGH (ref <117)

## 2011-09-07 LAB — LIPID PANEL
Cholesterol: 165 mg/dL (ref 0–200)
LDL Cholesterol: 91 mg/dL (ref 0–99)
Triglycerides: 181 mg/dL — ABNORMAL HIGH (ref <150)

## 2011-09-07 MED ORDER — CLOPIDOGREL BISULFATE 75 MG PO TABS: 75.0000 mg | ORAL_TABLET | Freq: Every day | ORAL | Status: DC

## 2011-09-07 MED ORDER — ASPIRIN 81 MG PO CHEW
CHEWABLE_TABLET | ORAL | Status: AC
  Filled 2011-09-07: qty 1

## 2011-09-07 MED ORDER — ASPIRIN EC 81 MG PO TBEC
81.0000 mg | DELAYED_RELEASE_TABLET | Freq: Every day | ORAL | Status: DC
  Administered 2011-09-07: 81 mg via ORAL
  Filled 2011-09-07: qty 1

## 2011-09-07 NOTE — Care Management Note (Signed)
    Page 1 of 1   09/07/2011     3:10:52 PM   CARE MANAGEMENT NOTE 09/07/2011  Patient:  [facility]   Account Number:  [id number]  Date Initiated:  09/07/2011  Documentation initiated by:  [facility]  Subjective/Objective Assessment:   Admitted with TIA     Action/Plan:   PT eval- no d/c needs identified   Anticipated DC Date:  09/07/2011   Anticipated DC Plan:  HOME/SELF CARE      DC Planning Services  CM consult      Choice offered to / List presented to:             Status of service:  Completed, signed off Medicare Important Message given?   (If response is "NO", the following Medicare IM given date fields will be blank) Date Medicare IM given:   Date Additional Medicare IM given:    Discharge Disposition:  HOME/SELF CARE  Per UR Regulation:  Reviewed for med. necessity/level of care/duration of stay  If discussed at Long Length of Stay Meetings, dates discussed:    Comments:

## 2011-09-07 NOTE — Discharge Summary (Signed)
Internal Medicine Teaching [facility] Discharge Note  Name: Katie Allen MRN: [id number] DOB: 1950/04/11 61 y.o.  Date of Admission: 09/06/2011 11:51 AM Date of Discharge: 09/07/2011 Attending Physician: Doneen Poisson  Discharge Diagnosis: Principal Problem:  *TIA (transient ischemic attack) Active Problems:  Hypertension  Hypothyroid Hyperlipidemia  Discharge Medications: Medication List  As of 09/07/2011  2:50 PM   STOP taking these medications         aspirin 81 MG EC tablet      estrogen (conjugated)-medroxyprogesterone 0.3-1.5 MG per tablet         TAKE these medications         b complex vitamins tablet   Take 1 tablet by mouth daily.      clopidogrel 75 MG tablet   Commonly known as: PLAVIX   Take 1 tablet (75 mg total) by mouth daily with breakfast.      levothyroxine 50 MCG tablet   Commonly known as: SYNTHROID, LEVOTHROID   Take 25-50 mcg by mouth daily. Alternates and 50 mcg daily      lisinopril-hydrochlorothiazide 20-12.5 MG per tablet   Commonly known as: PRINZIDE,ZESTORETIC   Take 1 tablet by mouth daily.      multivitamin tablet   Take 1 tablet by mouth daily.      PA VITAMIN D-3 2000 UNITS Caps   Generic drug: Cholecalciferol   Take by mouth.      pravastatin 20 MG tablet   Commonly known as: PRAVACHOL   Take 20 mg by mouth daily.      vitamin C 500 MG tablet   Commonly known as: ASCORBIC ACID   Take 500 mg by mouth daily.            Disposition and follow-up:   Katie Allen was discharged from [facility] in Good condition.  At the hospital follow up visit please address  1) L upper and lower extremity numbness Patient reports a two-week history of left upper and lower sternum and a numbness. CT and MRI of head did not demonstrate any lesions that would account for these changes. Please followup on numbness. 2) TIA on hormone replacement therapy Patient with TIA while taking Prempro therapy.  Recommended discontinuing her replacement therapy on discharge due to increased stroke and TIA risk.   3) Hyperlipidemia Pt had low HDL of 38. LDL was 91. Is on pravastatin at home. Recommend assess need for additional agent to augment HDL.  Follow-up Appointments: Follow-up Information    Follow up with Gates Rigg, MD. Schedule an appointment as soon as possible for a visit in 2 months.   Contact information:   [address], [address] [facility] [place] [address] [phone]         Discharge Orders    Future Orders Please Complete By Expires   Diet - low sodium heart healthy      Increase activity slowly      Discharge instructions      Comments:   1. Please call [facility] to make a follow-up appointment with Dr. Pearlean Brownie [phone]). 2. Please STOP TAKING Aspirin. 3. We are concerned that your hormone replacement may put you at higher risk for a stroke. We recommend stopping this medication and following up with your primary care doctor about whether you should restart it. 4. Please take Plavix 75mg  daily.  5. Please seek immediate medical attention for any sudden weakness, worsening numbness, confusion, or inability to speak.  Call MD for:  temperature >100.4      Call MD for:  difficulty breathing, headache or visual disturbances      Call MD for:  persistant dizziness or light-headedness         Consultations: Treatment Team:  Md Stroke, MD  Procedures Performed:  Ct Head Wo Contrast  09/06/2011  *RADIOLOGY REPORT*  Clinical Data: Code stroke, right facial droop  CT HEAD WITHOUT CONTRAST  Technique:  Contiguous axial images were obtained from the base of the skull through the vertex without contrast.  Comparison: MRI brain dated 09/12/2004  Findings: No evidence of parenchymal hemorrhage or extra-axial fluid collection. No mass lesion, mass effect, or midline shift.  No CT evidence of acute infarction.   Subcortical white matter and periventricular small vessel ischemic changes.  Mild global cortical atrophy.  No ventriculomegaly.  The visualized paranasal sinuses are essentially clear. The mastoid air cells are unopacified.  No evidence of calvarial fracture.  IMPRESSION: No evidence of acute intracranial abnormality.  Atrophy with small vessel ischemic changes.  Original Report Authenticated By: Charline Bills, M.D.   Mr [facility] Wo Contrast  09/06/2011  *RADIOLOGY REPORT*  Clinical Data:  Right-sided facial droop.  Hypertension.  Acute but ill-defined vascular disease.  MRI HEAD WITHOUT CONTRAST MRA HEAD WITHOUT CONTRAST  Technique:  Multiplanar, multiecho pulse sequences of the brain and surrounding structures were obtained without intravenous contrast. Angiographic images of the head were obtained using MRA technique without contrast.  Comparison:  09/06/2011  MRI HEAD  Findings:  Diffusion imaging does not show any acute or subacute infarction.  The brainstem and cerebellum are normal.  The cerebral hemispheres show scattered foci of T2 and FLAIR signal within the subcortical and deep white matter consistent with chronic small vessel disease.  No cortical or large vessel territory infarction. No mass lesion, hemorrhage, hydrocephalus or extra-axial collection.  No pituitary mass.  No inflammatory sinus disease.  No skull or skull base lesion.  IMPRESSION: No acute finding.  Chronic small vessel change of the hemispheric white matter.  MRA HEAD  Findings: Both internal carotid arteries are widely patent into the brain.  No siphon stenosis.  The anterior and middle cerebral vessels are patent without proximal stenosis, aneurysm or vascular malformation.  Both vertebral arteries are patent to the basilar. No basilar stenosis.  Posterior circulation branch capsules are normal.  IMPRESSION: Normal intracranial MR angiography of the large and medium-sized vessels.  Original Report Authenticated By: Thomasenia Sales, M.D.   Mr Brain Wo Contrast  09/06/2011  *RADIOLOGY REPORT*  Clinical Data:  Right-sided facial droop.  Hypertension.  Acute but ill-defined vascular disease.  MRI HEAD WITHOUT CONTRAST MRA HEAD WITHOUT CONTRAST  Technique:  Multiplanar, multiecho pulse sequences of the brain and surrounding structures were obtained without intravenous contrast. Angiographic images of the head were obtained using MRA technique without contrast.  Comparison:  09/06/2011  MRI HEAD  Findings:  Diffusion imaging does not show any acute or subacute infarction.  The brainstem and cerebellum are normal.  The cerebral hemispheres show scattered foci of T2 and FLAIR signal within the subcortical and deep white matter consistent with chronic small vessel disease.  No cortical or large vessel territory infarction. No mass lesion, hemorrhage, hydrocephalus or extra-axial collection.  No pituitary mass.  No inflammatory sinus disease.  No skull or skull base lesion.  IMPRESSION: No acute finding.  Chronic small vessel change of the hemispheric white matter.  MRA HEAD  Findings: Both internal  carotid arteries are widely patent into the brain.  No siphon stenosis.  The anterior and middle cerebral vessels are patent without proximal stenosis, aneurysm or vascular malformation.  Both vertebral arteries are patent to the basilar. No basilar stenosis.  Posterior circulation branch capsules are normal.  IMPRESSION: Normal intracranial MR angiography of the large and medium-sized vessels.  Original Report Authenticated By: Thomasenia Sales, M.D.   2D Echo Normal LV size and systolic function, EF 55-60%. Normal RV size and systolic function. No significant valvular abnormalities.  Carotid Dopplers Bilateral: No ICA stenosis. Vertebral artery flow is antegrade.  Admission HPI:  Ms. Daughety is a 61 yo F w pmh of HTN and hyperlipidemia presenting to the ED w history of R-sided lower facial weakness starting this morning.  Pt noticed R  sided facial droop this morning at 10:00. She did not have any headache, aphasia, confusion, or acute lower extremity weakness. She was brought to the ED and code stroke was called. By the time she arrived at the ED, her symptoms had resolved. TPa was not given. NIHSS was 1 for numbness on the left side, which pt reports has been present over "the last few weeks." She perceives decreased sensation over L arm and leg with no associated weakness.  She has no previous history of weakness, numbness, TIA or stroke. She takes 81mg  of ASA. Her stroke risk factors include family history, hypertension, and hyperlipidemia.    Hospital Course by problem list: 1. TIA  The patient presented to [facility] ED with complaints of transient right-sided facial droop. Upon arrival to ED, code stroke was called and patient was evaluated by the stroke team. TPA was not given due to time a day. CT and MRI of head negative for acute stroke. She was evaluated by Dr. Roseanne Reno recommended admission for further workup in light of strong family history and risk factors. Carotid Dopplers did not reveal any stenoses and 2-D echo was negative for thrombus. Her ABCD2 score was 5, conferring moderate risk for stroke in the next 90 days (9.8%). She did not have any further right-sided facial weakness or any focal neurologic deficits but developed over the course of her admission. Neurology recommended discharging on Plavix monotherapy for secondary prevention, and discontinuing aspirin therapy. We recommended that she stop taking her hormone replacement therapy, as this may confer increased risk of stroke. Risk stratification was notable for low HDL of 38. LDL was 91. HbA1c was 6.0. Of note, etiology persistent left-sided numbness is unclear the time of discharge. There are no lesions on MRI to account for symptoms. The symptoms also seem to be subacute in nature. She will followup with [facility] in 2 months. At the time of discharge,  patient had no right-sided facial weakness, and reported that she was back to baseline.  2. HTN  Patient with no evidence of acute stroke. We provided home blood pressure medications of lisinopril and HCTZ. No hypertensive urgency during admission.  3. Hypothyroidism  Pt w h/o hypothyroidism. We continued her home dose of Synthroid during this admission.  4. Hyperlipidemia  Fasting lipid panel notable for low HDL of 38. Patient is on pravastatin at home. We continued statin therapy during her admission. Recommend PCP evaluate for addition of agent to increase HDL.  Discharge Vitals:  BP 118/75  Pulse 68  Temp 98.2 F (36.8 C) (Oral)  Resp 18  Ht 5\' 11"  (1.803 m)  Wt 222 lb (100.699 kg)  BMI 30.96 kg/m2  SpO2 99%  Discharge Day PE General: resting in bed eating lunch, smiling, NAD   Cardiac: RRR, no rubs, murmurs or gallops  Pulm: clear to auscultation bilaterally, no wheezes, rales, or rhonchi  Abd: soft, nontender, nondistended, BS present  Ext: warm and well perfused, no pedal edema  Neurological: A&O x3, Strength is normal and symmetric bilaterally, cranial nerve II-XII are intact, no focal motor deficit.  Psychiatric: Normal mood and affect. speech and behavior is normal. Judgment and thought content normal. Cognition and memory are normal.    Discharge Labs:  Results for orders placed during the hospital encounter of 09/06/11 (from the past 24 hour(s))  HEMOGLOBIN A1C     Status: Abnormal   Collection Time   09/07/11  5:30 AM      Component Value Range   Hemoglobin A1C 6.0 (*) <5.7 %   Mean Plasma Glucose 126 (*) <117 mg/dL  LIPID PANEL     Status: Abnormal   Collection Time   09/07/11  5:30 AM      Component Value Range   Cholesterol 165  0 - 200 mg/dL   Triglycerides 454 (*) <150 mg/dL   HDL 38 (*) >09 mg/dL   Total CHOL/HDL Ratio 4.3     VLDL 36  0 - 40 mg/dL   LDL Cholesterol 91  0 - 99 mg/dL    Signed: Bronson Curb 09/07/2011, 2:50 PM   Time Spent on  Discharge: 40 min

## 2011-09-07 NOTE — H&P (Signed)
Internal Medicine Attending Admission Note Date: 09/07/2011  Patient name: Katie Allen Medical record number: [id number] Date of birth: [birth year] Age: 61 y.o. Gender: female  I saw and evaluated the patient. I reviewed the resident's note and I agree with the resident's findings and plan as documented in the resident's note.  Chief Complaint(s): Right facial droop.  History - key components related to admission:  [name] is a 61 year old woman with a history of hypertension, hyperlipidemia, and Hashimoto's thyroiditis who presents with a several hour history of right-sided facial droop. She was in her usual state of health on the morning of admission. When she placed her makeup on her face first thing in the morning there were no abnormalities. 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 She had no associated numbness or weakness with this symptom. She mentioned it to the physician in the doctor's office and a blood pressure was checked and found to be 160/110. She was advised to come to the emergency department. Soon after arrival to the emergency department the vast majority of her right facial droop had resolved. She was admitted to the internal medicine teaching service for further evaluation. She currently denies any numbness, weakness, recent trauma, fevers, shakes, chills, headaches, palpitations, or shortness of breath. She is without acute complaints.  Physical Exam - key components related to admission:  Filed Vitals:   09/06/11 2154 09/07/11 0217 09/07/11 0514 09/07/11 1000  BP: 121/72 116/78 125/83 118/75  Pulse: 79 63 73 68  Temp: 98 F (36.7 C) 97.7 F (36.5 C) 98 F (36.7 C) 98.2 F (36.8 C)  TempSrc: Oral Oral Oral Oral  Resp: 18 20 20 18   Height:   5\' 11"  (1.803 m)   Weight:   222 lb (100.699 kg)   SpO2: 97% 98% 97% 99%   General:  Well-developed well-nourished woman lying comfortably in bed in no acute distress. Lungs: Clear to auscultation bilaterally without wheezes, rhonchi, or rales. Heart: Regular rate and rhythm without murmurs, rubs, or gallops. Abdomen: Soft, nontender, active bowel sounds. Extremities: Without edema. Neuro: Alert and oriented x 3, cranial nerve II through XII intact with the exception of some mild weakness in cranial nerve VII on the right side marked by a very mild right-sided facial droop, strength 5/5 throughout in the upper and lower extremities. Sensation to light touch intact bilaterally and symmetric.  Lab results:  Basic Metabolic Panel:  Basename 09/06/11 1210 09/06/11 1201  NA 142 142  K 3.4* 3.3*  CL 106 106  CO2 -- 25  GLUCOSE 94 95  BUN 16 16  CREATININE 0.80 0.63  CALCIUM -- 9.6  MG -- --  PHOS -- --   Liver Function Tests:  Basename 09/06/11 1201  AST 16  ALT 22  ALKPHOS 76  BILITOT 0.4  PROT 6.9  ALBUMIN 3.9   CBC:  Basename 09/06/11 1210 09/06/11 1201  WBC -- 8.2  NEUTROABS -- 3.7  HGB 13.9 14.0  HCT 41.0 41.4  MCV -- 87.3  PLT -- 252   Cardiac Enzymes:  Basename 09/06/11 1205  CKTOTAL 69  CKMB 1.7  CKMBINDEX --  TROPONINI <0.30   Fasting Lipid Panel:  Basename 09/07/11 0530  CHOL 165  HDL 38*  LDLCALC 91  TRIG 409*  CHOLHDL 4.3  LDLDIRECT --   Coagulation:  Basename 09/06/11 1201  INR 1.10  Imaging results:  Ct Head Wo Contrast  09/06/2011  *RADIOLOGY REPORT*  Clinical Data: Code stroke, right facial droop  CT HEAD WITHOUT CONTRAST  Technique:  Contiguous axial images were obtained from the base of the skull through the vertex without contrast.  Comparison: MRI brain dated 09/12/2004  Findings: No evidence of parenchymal hemorrhage or extra-axial fluid collection. No mass lesion, mass effect, or midline shift.  No CT evidence of acute infarction.  Subcortical white matter and periventricular small vessel ischemic changes.  Mild  global cortical atrophy.  No ventriculomegaly.  The visualized paranasal sinuses are essentially clear. The mastoid air cells are unopacified.  No evidence of calvarial fracture.  IMPRESSION: No evidence of acute intracranial abnormality.  Atrophy with small vessel ischemic changes.  Original Report Authenticated By: Charline Bills, M.D.   Mr [facility] Wo Contrast  09/06/2011  *RADIOLOGY REPORT*  Clinical Data:  Right-sided facial droop.  Hypertension.  Acute but ill-defined vascular disease.  MRI HEAD WITHOUT CONTRAST MRA HEAD WITHOUT CONTRAST  Technique:  Multiplanar, multiecho pulse sequences of the brain and surrounding structures were obtained without intravenous contrast. Angiographic images of the head were obtained using MRA technique without contrast.  Comparison:  09/06/2011  MRI HEAD  Findings:  Diffusion imaging does not show any acute or subacute infarction.  The brainstem and cerebellum are normal.  The cerebral hemispheres show scattered foci of T2 and FLAIR signal within the subcortical and deep white matter consistent with chronic small vessel disease.  No cortical or large vessel territory infarction. No mass lesion, hemorrhage, hydrocephalus or extra-axial collection.  No pituitary mass.  No inflammatory sinus disease.  No skull or skull base lesion.  IMPRESSION: No acute finding.  Chronic small vessel change of the hemispheric white matter.  MRA HEAD  Findings: Both internal carotid arteries are widely patent into the brain.  No siphon stenosis.  The anterior and middle cerebral vessels are patent without proximal stenosis, aneurysm or vascular malformation.  Both vertebral arteries are patent to the basilar. No basilar stenosis.  Posterior circulation branch capsules are normal.  IMPRESSION: Normal intracranial MR angiography of the large and medium-sized vessels.  Original Report Authenticated By: Thomasenia Sales, M.D.   Mr Brain Wo Contrast  09/06/2011  *RADIOLOGY REPORT*  Clinical Data:   Right-sided facial droop.  Hypertension.  Acute but ill-defined vascular disease.  MRI HEAD WITHOUT CONTRAST MRA HEAD WITHOUT CONTRAST  Technique:  Multiplanar, multiecho pulse sequences of the brain and surrounding structures were obtained without intravenous contrast. Angiographic images of the head were obtained using MRA technique without contrast.  Comparison:  09/06/2011  MRI HEAD  Findings:  Diffusion imaging does not show any acute or subacute infarction.  The brainstem and cerebellum are normal.  The cerebral hemispheres show scattered foci of T2 and FLAIR signal within the subcortical and deep white matter consistent with chronic small vessel disease.  No cortical or large vessel territory infarction. No mass lesion, hemorrhage, hydrocephalus or extra-axial collection.  No pituitary mass.  No inflammatory sinus disease.  No skull or skull base lesion.  IMPRESSION: No acute finding.  Chronic small vessel change of the hemispheric white matter.  MRA HEAD  Findings: Both internal carotid arteries are widely patent into the brain.  No siphon stenosis.  The anterior and middle cerebral vessels are patent without proximal stenosis, aneurysm or vascular malformation.  Both vertebral arteries are patent to the basilar. No basilar stenosis.  Posterior circulation branch capsules are normal.  IMPRESSION: Normal  intracranial MR angiography of the large and medium-sized vessels.  Original Report Authenticated By: Thomasenia Sales, M.D.   Other results:  EKG: Normal sinus rhythm at 86 beats per minute, normal axis, normal intervals, no ST or T wave changes. No significant changes from the previous ECG on 09/17/2009. Room  Assessment & Plan by Problem:  Ms. Haye is a 61 year old woman with a history of hypertension and hyperlipidemia who presents with a several hour history of right facial droop. This was not associated with any other symptoms or signs. A head CT demonstrated no acute bleed and an MRI and  MRA was remarkable for chronic small vessel changes but no acute CVA. She therefore qualifies for a diagnosis of transient ischemic attack.  1) Transient ischemic attack: As per the neurology recommendations we will discontinue the aspirin and start Plavix 75 mg by mouth daily. We will continue to work on modifiable risk factors, specifically her hypertension which is currently well controlled and her hyperlipidemia which currently demonstrates an LDL less than 100.  2) Disposition: I agree with the housestaff's plan to discharge Ms. Ketchum home today with followup in her primary care provider's office as well as with the neurologist.

## 2011-09-07 NOTE — Progress Notes (Signed)
  Echocardiogram 2D Echocardiogram has been performed.  Katie Allen 09/07/2011, 11:09 AM

## 2011-09-07 NOTE — Progress Notes (Signed)
Stroke Team Progress Note  HISTORY Katie Allen is an 61 y.o. female with a history of hypertension and hyperlipidemia who was brought to the emergency room and code stroke status with a history of weakness involving right lower face. Patient was last known normal at about 7:30 this morning. She had drooping of her right lower face, which has since resolved. She had no speech changes nor any right upper extremity symptoms. Sensory changes involving her left side have been present for at least one month and were unchanged today. No previous history of stroke nor TIA. She's been on aspirin 81 mg per day. CT scan of her head showed no acute intracranial abnormality. NIH stroke scale was 1 for numbness on the left side.  Patient was not a TPA candidate secondary to mild symptoms. She was admitted for further evaluation and treatment.  SUBJECTIVE Patient in currently in vascular lab undergoing a 2D echo cardiogram. Overall she feels her condition is gradually improving. With improving sensation.  OBJECTIVE Most recent Vital Signs: Filed Vitals:   09/06/11 1800 09/06/11 2154 09/07/11 0217 09/07/11 0514  BP: 135/84 121/72 116/78 125/83  Pulse: 74 79 63 73  Temp: 98.1 F (36.7 C) 98 F (36.7 C) 97.7 F (36.5 C) 98 F (36.7 C)  TempSrc: Oral Oral Oral Oral  Resp: 18 18 20 20   Height:    5\' 11"  (1.803 m)  Weight:    100.699 kg (222 lb)  SpO2: 96% 97% 98% 97%   MEDICATIONS    . aspirin      . aspirin EC  81 mg Oral Daily  . B-complex with vitamin C  1 tablet Oral Daily  . cholecalciferol  2,000 Units Oral Daily  . clopidogrel  75 mg Oral Q breakfast  . enoxaparin  40 mg Subcutaneous Q24H  . medroxyPROGESTERone  1.25 mg Oral Daily   And  . estrogens (conjugated)  0.3 mg Oral Daily  . hydrochlorothiazide  12.5 mg Oral Daily  . levothyroxine  25 mcg Oral Q48H  . levothyroxine  50 mcg Oral Q48H  . lisinopril  20 mg Oral Daily  . multivitamin with minerals  1 tablet Oral Daily  .  simvastatin  10 mg Oral q1800  . vitamin C  500 mg Oral Daily  . DISCONTD: b complex vitamins  1 tablet Oral Daily  . DISCONTD: Cholecalciferol  2,000 Units Oral Daily  . DISCONTD: estrogen (conjugated)-medroxyprogesterone  1 tablet Oral Daily  . DISCONTD: levothyroxine  25-50 mcg Oral Daily  . DISCONTD: lisinopril-hydrochlorothiazide  1 tablet Oral Daily  . DISCONTD: lisinopril-hydrochlorothiazide  1 tablet Oral Daily  . DISCONTD: multivitamin  1 tablet Oral Daily   PRN:  senna-docusate  Diet:  Cardiac thin liquids Activity:  Up with assistance DVT Prophylaxis:  Lovenox 40 mg sq daily   CLINICALLY SIGNIFICANT STUDIES Basic Metabolic Panel:  Lab 09/06/11 1610 09/06/11 1201  NA 142 142  K 3.4* 3.3*  CL 106 106  CO2 -- 25  GLUCOSE 94 95  BUN 16 16  CREATININE 0.80 0.63  CALCIUM -- 9.6  MG -- --  PHOS -- --   Liver Function Tests:  Lab 09/06/11 1201  AST 16  ALT 22  ALKPHOS 76  BILITOT 0.4  PROT 6.9  ALBUMIN 3.9   CBC:  Lab 09/06/11 1210 09/06/11 1201  WBC -- 8.2  NEUTROABS -- 3.7  HGB 13.9 14.0  HCT 41.0 41.4  MCV -- 87.3  PLT -- 252   Coagulation:  Lab  09/06/11 1201  LABPROT 14.4  INR 1.10   Cardiac Enzymes:  Lab 09/06/11 1205  CKTOTAL 69  CKMB 1.7  CKMBINDEX --  TROPONINI <0.30   Lipid Panel    Component Value Date/Time   CHOL 165 09/07/2011 0530   TRIG 181* 09/07/2011 0530   HDL 38* 09/07/2011 0530   CHOLHDL 4.3 09/07/2011 0530   VLDL 36 09/07/2011 0530   LDLCALC 91 09/07/2011 0530   HgbA1C  No results found for this basename: HGBA1C   Urine Drug Screen:   No results found for this basename: labopia, cocainscrnur, labbenz, amphetmu, thcu, labbarb    Alcohol Level: No results found for this basename: ETH:2 in the last 168 hours  CT of the brain  09/06/2011  No evidence of acute intracranial abnormality.  Atrophy with small vessel ischemic changes.  MRI of the brain  09/06/2011  No acute finding.  Chronic small vessel change of the hemispheric  white matter.    MRA of the brain  09/06/2011  Normal intracranial MR angiography of the large and medium-sized vessels.   2D Echocardiogram    Carotid Doppler  No internal carotid artery stenosis bilaterally. Vertebrals with antegrade flow bilaterally.   CXR    EKG  normal EKG, normal sinus rhythm.   Therapy Recommendations PT -none, OT -  Physical Exam pleasant middle-aged lady not in distress.Awake alert. Afebrile. Head is nontraumatic. Neck is supple without bruit. Hearing is normal. Cardiac exam no murmur or gallop. Lungs are clear to auscultation. Distal pulses are well felt.   Neurological Exam : Awake  Alert oriented x 3. Normal speech and language.eye movements full without nystagmus.fundi not visualized. Vision acuity and fields appear normal.Face symmetric. Tongue midline. Normal strength, tone, reflexes and coordination. Normal sensation. Gait deferred.  ASSESSMENT [name] is a 61 y.o. female with a left brain TIA secondary to unknown etiology, workup underway. On aspirin 81 mg orally every day prior to admission. Now on aspirin 81 mg orally every day and clopidogrel 75 mg orally every day for secondary stroke prevention. Patient with no resultant stroke symptoms.  -obesity, Body mass index is 30.96 kg/(m^2). -strong family history of stroke  Hospital day # 1  TREATMENT/PLAN -Continue clopidogrel 75 mg orally every day for secondary stroke prevention. Discontinue aspirin. In general, dual antiplatelets are not recommended due to risk of cerebral hemorrhage. Occasionally, you will see them used short term in setting of carotid stenosis or intracranial atherosclerosis. -ok for discharge from neuro standpoint once workup completed -follow up with Dr. Pearlean Brownie in 2 mo.  Joaquin Music, ANP-BC, GNP-BC [facility] Pager: [phone] 09/07/2011 10:36 AM  Dr. Delia Heady, Stroke Center Medical Director, has personally reviewed chart, pertinent data,  examined the patient and developed the plan of care. Pager:  [phone]

## 2011-09-07 NOTE — Progress Notes (Signed)
*  PRELIMINARY RESULTS* Vascular Ultrasound Carotid Duplex (Doppler) has been completed.  Preliminary findings: Bilateral: No ICA stenosis. Vertebral artery flow is antegrade.   Tamorah Hada 09/07/2011, 10:24 AM

## 2011-09-07 NOTE — Progress Notes (Signed)
Subjective: Pt eating lunch in bed. Has just completed carotid doppler studies and echo, which were normal. Says she is ready to go home. Denies any new weakness or numbness. Persistent decreased sensation of L upper and lower extremity. Says she will f/u w [facility]. Denies CP, SOB, N/V/D, dizziness, confusion.  Objective: Vital signs in last 24 hours: Filed Vitals:   09/06/11 2154 09/07/11 0217 09/07/11 0514 09/07/11 1000  BP: 121/72 116/78 125/83 118/75  Pulse: 79 63 73 68  Temp: 98 F (36.7 C) 97.7 F (36.5 C) 98 F (36.7 C) 98.2 F (36.8 C)  TempSrc: Oral Oral Oral Oral  Resp: 18 20 20 18   Height:   5\' 11"  (1.803 m)   Weight:   222 lb (100.699 kg)   SpO2: 97% 98% 97% 99%   Weight change:  No intake or output data in the 24 hours ending 09/07/11 1505  Physical Exam General: resting in bed eating lunch, smiling, NAD  Cardiac: RRR, no rubs, murmurs or gallops  Pulm: clear to auscultation bilaterally, no wheezes, rales, or rhonchi  Abd: soft, nontender, nondistended, BS present  Ext: warm and well perfused, no pedal edema  Neurological: A&O x3, Strength is normal and symmetric bilaterally, cranial nerve II-XII are intact, no focal motor deficit.  Psychiatric: Normal mood and affect. speech and behavior is normal. Judgment and thought content normal. Cognition and memory are normal.   Lab Results: Basic Metabolic Panel:  Lab 09/06/11 1610 09/06/11 1201  NA 142 142  K 3.4* 3.3*  CL 106 106  CO2 -- 25  GLUCOSE 94 95  BUN 16 16  CREATININE 0.80 0.63  CALCIUM -- 9.6  MG -- --  PHOS -- --   Liver Function Tests:  Lab 09/06/11 1201  AST 16  ALT 22  ALKPHOS 76  BILITOT 0.4  PROT 6.9  ALBUMIN 3.9   CBC:  Lab 09/06/11 1210 09/06/11 1201  WBC -- 8.2  NEUTROABS -- 3.7  HGB 13.9 14.0  HCT 41.0 41.4  MCV -- 87.3  PLT -- 252   Cardiac Enzymes:  Lab 09/06/11 1205  CKTOTAL 69  CKMB 1.7  CKMBINDEX --  TROPONINI <0.30   Hemoglobin A1C:  Lab  09/07/11 0530  HGBA1C 6.0*   Fasting Lipid Panel:  Lab 09/07/11 0530  CHOL 165  HDL 38*  LDLCALC 91  TRIG 960*  CHOLHDL 4.3  LDLDIRECT --   Coagulation:  Lab 09/06/11 1201  LABPROT 14.4  INR 1.10   Studies/Results: Ct Head Wo Contrast  09/06/2011  *RADIOLOGY REPORT*  Clinical Data: Code stroke, right facial droop  CT HEAD WITHOUT CONTRAST  Technique:  Contiguous axial images were obtained from the base of the skull through the vertex without contrast.  Comparison: MRI brain dated 09/12/2004  Findings: No evidence of parenchymal hemorrhage or extra-axial fluid collection. No mass lesion, mass effect, or midline shift.  No CT evidence of acute infarction.  Subcortical white matter and periventricular small vessel ischemic changes.  Mild global cortical atrophy.  No ventriculomegaly.  The visualized paranasal sinuses are essentially clear. The mastoid air cells are unopacified.  No evidence of calvarial fracture.  IMPRESSION: No evidence of acute intracranial abnormality.  Atrophy with small vessel ischemic changes.  Original Report Authenticated By: Charline Bills, M.D.   Mr [facility] Wo Contrast  09/06/2011  *RADIOLOGY REPORT*  Clinical Data:  Right-sided facial droop.  Hypertension.  Acute but ill-defined vascular disease.  MRI HEAD WITHOUT CONTRAST MRA HEAD WITHOUT CONTRAST  Technique:  Multiplanar, multiecho pulse sequences of the brain and surrounding structures were obtained without intravenous contrast. Angiographic images of the head were obtained using MRA technique without contrast.  Comparison:  09/06/2011  MRI HEAD  Findings:  Diffusion imaging does not show any acute or subacute infarction.  The brainstem and cerebellum are normal.  The cerebral hemispheres show scattered foci of T2 and FLAIR signal within the subcortical and deep white matter consistent with chronic small vessel disease.  No cortical or large vessel territory infarction. No mass lesion, hemorrhage, hydrocephalus  or extra-axial collection.  No pituitary mass.  No inflammatory sinus disease.  No skull or skull base lesion.  IMPRESSION: No acute finding.  Chronic small vessel change of the hemispheric white matter.  MRA HEAD  Findings: Both internal carotid arteries are widely patent into the brain.  No siphon stenosis.  The anterior and middle cerebral vessels are patent without proximal stenosis, aneurysm or vascular malformation.  Both vertebral arteries are patent to the basilar. No basilar stenosis.  Posterior circulation branch capsules are normal.  IMPRESSION: Normal intracranial MR angiography of the large and medium-sized vessels.  Original Report Authenticated By: Thomasenia Sales, M.D.   Mr Brain Wo Contrast  09/06/2011  *RADIOLOGY REPORT*  Clinical Data:  Right-sided facial droop.  Hypertension.  Acute but ill-defined vascular disease.  MRI HEAD WITHOUT CONTRAST MRA HEAD WITHOUT CONTRAST  Technique:  Multiplanar, multiecho pulse sequences of the brain and surrounding structures were obtained without intravenous contrast. Angiographic images of the head were obtained using MRA technique without contrast.  Comparison:  09/06/2011  MRI HEAD  Findings:  Diffusion imaging does not show any acute or subacute infarction.  The brainstem and cerebellum are normal.  The cerebral hemispheres show scattered foci of T2 and FLAIR signal within the subcortical and deep white matter consistent with chronic small vessel disease.  No cortical or large vessel territory infarction. No mass lesion, hemorrhage, hydrocephalus or extra-axial collection.  No pituitary mass.  No inflammatory sinus disease.  No skull or skull base lesion.  IMPRESSION: No acute finding.  Chronic small vessel change of the hemispheric white matter.  MRA HEAD  Findings: Both internal carotid arteries are widely patent into the brain.  No siphon stenosis.  The anterior and middle cerebral vessels are patent without proximal stenosis, aneurysm or vascular  malformation.  Both vertebral arteries are patent to the basilar. No basilar stenosis.  Posterior circulation branch capsules are normal.  IMPRESSION: Normal intracranial MR angiography of the large and medium-sized vessels.  Original Report Authenticated By: Thomasenia Sales, M.D.   Medications: I have reviewed the patient's current medications. Scheduled Meds:   . aspirin      . B-complex with vitamin C  1 tablet Oral Daily  . cholecalciferol  2,000 Units Oral Daily  . clopidogrel  75 mg Oral Q breakfast  . enoxaparin  40 mg Subcutaneous Q24H  . medroxyPROGESTERone  1.25 mg Oral Daily   And  . estrogens (conjugated)  0.3 mg Oral Daily  . hydrochlorothiazide  12.5 mg Oral Daily  . levothyroxine  25 mcg Oral Q48H  . levothyroxine  50 mcg Oral Q48H  . lisinopril  20 mg Oral Daily  . multivitamin with minerals  1 tablet Oral Daily  . simvastatin  10 mg Oral q1800  . vitamin C  500 mg Oral Daily  . DISCONTD: aspirin EC  81 mg Oral Daily  . DISCONTD: b complex vitamins  1 tablet Oral Daily  .  DISCONTD: Cholecalciferol  2,000 Units Oral Daily  . DISCONTD: estrogen (conjugated)-medroxyprogesterone  1 tablet Oral Daily  . DISCONTD: levothyroxine  25-50 mcg Oral Daily  . DISCONTD: lisinopril-hydrochlorothiazide  1 tablet Oral Daily  . DISCONTD: lisinopril-hydrochlorothiazide  1 tablet Oral Daily  . DISCONTD: multivitamin  1 tablet Oral Daily   Continuous Infusions:  PRN Meds:.senna-docusate  Assessment/Plan: 1. TIA  Pt is a 61 yo F w transient R sided facial droop and subacute decreased sensation of L arm and leg. Her facial droop had resolved by the time of presentation to the ED. She was not given tPA. CT and MRI of head negative for acute stroke. She was evaluated by Dr. Roseanne Reno who recommended admission for further workup in light of strong family history and risk factors. The etiology of persistent L sided numbness is not clear at this point. Her ABCD2 score is 5 (moderate risk: 90d  stroke risk 9.8%).  Carotid dopplers showed no significant stenoses and echo was negative for thrombus. HbA1c was 6.0, FLP notable for low HDL (38).  - Plavix 75mg  qd monotherapy, per neurology recs. D/C ASA, risk of hemorrhage outweighs potential benefit of dual therapy - F/U w [facility] for L sided numbness  2. HTN  No acute stroke. Restarted BP meds on admission.  -Lisinopril, HCTZ   3. Hypothyroidism  Pt w h/o hypothyroidism.  - Continue Synthroid   4. Hyperlipidemia  FLP notable for low HDL - Continue home simvastatin   LOS: 1 day   Bronson Curb 09/07/2011, 3:05 PM

## 2011-09-07 NOTE — Progress Notes (Signed)
Pt d/c home with stroke/TIA discharge instructions, she stated the s/s of symptoms of a stroke/TIA and was able to recall information.  She did not have any questions and was given the stroke discharge papers. Patient was stable. Pt d/c to home

## 2011-09-07 NOTE — Evaluation (Signed)
Physical Therapy Evaluation Patient Details Name: Katie Allen MRN: [id number] DOB: 1950/06/30 Today's Date: 09/07/2011 Time: 5621-3086 PT Time Calculation (min): 14 min  PT Assessment / Plan / Recommendation Clinical Impression  Katie Allen is 61 y/o female admitted with right facial drooping which has since resolved. Presents to physical therapy today independent with all mobility and gait, no further balance deficits persist. Educated pt on controlled modifiable risk factors with emphasis on aerobic exercise program. Discussed strategies to integrate exercise into her daily routine as pt spends a lot of time seated at a computer daily. Pt verbalizes understanding. Pt aware of signs and symptoms of CVA and to seek medical attention immediately should she have any of these again. No further PT needs at this time, PT signing off.     PT Assessment  Patent does not need any further PT services    Follow Up Recommendations  No PT follow up    Barriers to Discharge        Equipment Recommendations  None recommended by PT    Recommendations for Other Services     Frequency      Precautions / Restrictions Precautions Precautions: None         Mobility  Bed Mobility Bed Mobility: Supine to Sit Supine to Sit: 7: Independent;HOB flat Transfers Transfers: Sit to Stand;Stand to Sit Sit to Stand: 7: Independent Stand to Sit: 7: Independent Ambulation/Gait Ambulation/Gait Assistance: 7: Independent Ambulation Distance (Feet): 400 Feet Assistive device: None Gait Pattern: Within Functional Limits Stairs: Yes Stairs Assistance: 7: Independent Stair Management Technique: No rails Number of Stairs: 12  Modified Rankin (Stroke Patients Only) Pre-Morbid Rankin Score: No symptoms Modified Rankin: No symptoms        Visit Information  Last PT Received On: 09/07/11 Assistance Needed: +1    Subjective Data  Subjective: I've been very stressed recently with finances and  both my husband and I being out of work all last year.  Patient Stated Goal: home   Prior Functioning  Home Living Lives With: Spouse Available Help at Discharge: Family;Available PRN/intermittently Type of Home: House Home Access: Stairs to enter [employer] of Steps: 2 Entrance Stairs-Rails: Right;Left;Can reach both Home Layout: Two level;Able to live on main level with bedroom/bathroom Bathroom Shower/Tub: Engineer, manufacturing systems: Standard Home Adaptive Equipment: None Prior Function Level of Independence: Independent Able to Take Stairs?: Yes Driving: Yes Vocation: Full time employment Comments: works 12 hour days Communication Communication: No difficulties    Cognition  Overall Cognitive Status: Appears within functional limits for tasks assessed/performed Arousal/Alertness: Awake/alert Orientation Level: Appears intact for tasks assessed Behavior During Session: [facility] for tasks performed    Extremity/Trunk Assessment Right Upper Extremity Assessment RUE ROM/Strength/Tone: Within functional levels RUE Sensation: WFL - Light Touch;WFL - Proprioception RUE Coordination: WFL - gross/fine motor Left Upper Extremity Assessment LUE ROM/Strength/Tone: Within functional levels LUE Sensation: WFL - Proprioception;Deficits LUE Sensation Deficits: pt reports a generalized numb feeling over the past 2-3 weeks but it has improved today LUE Coordination: WFL - gross/fine motor Right Lower Extremity Assessment RLE ROM/Strength/Tone: Within functional levels RLE Sensation: WFL - Light Touch;WFL - Proprioception RLE Coordination: WFL - gross/fine motor Left Lower Extremity Assessment LLE ROM/Strength/Tone: Deficits LLE ROM/Strength/Tone Deficits: reports numb feeling but improved since admission LLE Sensation: WFL - Proprioception LLE Coordination: WFL - gross/fine motor Trunk Assessment Trunk Assessment: Normal   Balance Standardized Balance  Assessment Standardized Balance Assessment: Dynamic Gait Index Dynamic Gait Index Level Surface: Normal Change in Gait Speed:  Normal Gait with Horizontal Head Turns: Normal Gait with Vertical Head Turns: Normal Gait and Pivot Turn: Normal Step Over Obstacle: Normal Step Around Obstacles: Normal Steps: Normal Total Score: 24  High Level Balance High Level Balance Activites: Backward walking;Direction changes;Sudden stops;Head turns  End of Session PT - End of Session Equipment Utilized During Treatment: Gait belt Activity Tolerance: Patient tolerated treatment well Patient left: in bed;with call bell/phone within reach Nurse Communication: Mobility status  GP     [facility] HELEN 09/07/2011, 8:57 AM

## 2011-11-03 ENCOUNTER — Other Ambulatory Visit: Payer: Self-pay | Admitting: Family Medicine

## 2011-11-03 DIAGNOSIS — Z1231 Encounter for screening mammogram for malignant neoplasm of breast: Secondary | ICD-10-CM

## 2011-11-23 ENCOUNTER — Ambulatory Visit
Admission: RE | Admit: 2011-11-23 | Discharge: 2011-11-23 | Disposition: A | Discharge status: Home / Self Care | Payer: BC Managed Care – PPO | Source: Ambulatory Visit | Attending: Family Medicine | Admitting: Family Medicine

## 2011-11-23 DIAGNOSIS — Z1231 Encounter for screening mammogram for malignant neoplasm of breast: Secondary | ICD-10-CM

## 2012-03-01 ENCOUNTER — Other Ambulatory Visit (HOSPITAL_COMMUNITY): Payer: Self-pay | Admitting: Internal Medicine

## 2012-03-04 ENCOUNTER — Other Ambulatory Visit (HOSPITAL_COMMUNITY): Payer: Self-pay | Admitting: Internal Medicine

## 2012-05-12 ENCOUNTER — Other Ambulatory Visit (INDEPENDENT_AMBULATORY_CARE_PROVIDER_SITE_OTHER): Payer: BC Managed Care – PPO

## 2012-05-12 DIAGNOSIS — Z0000 Encounter for general adult medical examination without abnormal findings: Secondary | ICD-10-CM

## 2012-05-12 LAB — POCT CBC
Hemoglobin: 14.6 g/dL (ref 12.2–16.2)
Lymph, poc: 3 (ref 0.6–3.4)
MCH, POC: 29.1 pg (ref 27–31.2)
MCHC: 33.4 g/dL (ref 31.8–35.4)
MPV: 8.7 fL (ref 0–99.8)
POC Granulocyte: 3.1 (ref 2–6.9)
POC LYMPH PERCENT: 47.6 %L (ref 10–50)
Platelet Count, POC: 250 10*3/uL (ref 142–424)
RDW, POC: 13.5 %
WBC: 6.4 10*3/uL (ref 4.6–10.2)

## 2012-05-12 LAB — COMPREHENSIVE METABOLIC PANEL
ALT: 24 U/L (ref 0–35)
Alkaline Phosphatase: 77 U/L (ref 39–117)
Creat: 0.72 mg/dL (ref 0.50–1.10)
Sodium: 139 mEq/L (ref 135–145)
Total Bilirubin: 0.5 mg/dL (ref 0.3–1.2)
Total Protein: 6.9 g/dL (ref 6.0–8.3)

## 2012-05-12 LAB — THYROID PANEL WITH TSH
Free Thyroxine Index: 3.1 (ref 1.0–3.9)
T3 Uptake: 33.2 % (ref 22.5–37.0)
TSH: 1.013 u[IU]/mL (ref 0.350–4.500)

## 2012-05-12 NOTE — Progress Notes (Unsigned)
Patient came in for labs only.Last Dr seen Katie Allen will come and make an appointment with new Dr for refill on thyroid medicine.

## 2012-05-15 LAB — NMR LIPOPROFILE WITH LIPIDS
Cholesterol, Total: 161 mg/dL (ref <200)
HDL Particle Number: 30.7 umol/L (ref >=30.5)
LDL Particle Number: 1245 nmol/L — ABNORMAL HIGH (ref <1000)
LP-IR Score: 79 — ABNORMAL HIGH (ref <=45)
Large VLDL-P: 6.2 nmol/L — ABNORMAL HIGH (ref <=2.7)
Small LDL Particle Number: 691 nmol/L — ABNORMAL HIGH (ref <=527)
Triglycerides: 173 mg/dL — ABNORMAL HIGH (ref <150)
VLDL Size: 53 nm — ABNORMAL HIGH (ref <=46.6)

## 2012-05-19 ENCOUNTER — Telehealth: Payer: Self-pay | Admitting: Family Medicine

## 2012-05-20 NOTE — Telephone Encounter (Signed)
What results

## 2012-05-20 NOTE — Telephone Encounter (Signed)
Pt notified of results Please review

## 2012-05-30 ENCOUNTER — Telehealth: Payer: Self-pay | Admitting: *Deleted

## 2012-05-30 MED ORDER — CLOPIDOGREL BISULFATE 75 MG PO TABS: 75.0000 mg | ORAL_TABLET | Freq: Every day | ORAL | Status: DC

## 2012-05-30 NOTE — Telephone Encounter (Signed)
called patient to sched f/u appt. with Dr Pearlean Brownie, confirmed.Marland KitchenMarland KitchenMarland Kitchen

## 2012-05-30 NOTE — Telephone Encounter (Signed)
Sent request to pharmacy tech for refill, sched patient with Dr Pearlean Brownie

## 2012-05-30 NOTE — Telephone Encounter (Signed)
Message copied by Monico Blitz on Tue May 30, 2012  2:21 PM ------      Message from: [facility], [place] L      Created: Tue May 30, 2012 12:45 PM      Contact: Pt       Pt states she has been calling since January to get an appt for a f/u and she is running out of medication and needs to be seen or needs refill on her meds and she only has 4 days of meds left and it is a blood thinner.  Please call and advise pt today.  Cell  [phone] ------

## 2012-06-01 ENCOUNTER — Other Ambulatory Visit: Payer: Self-pay | Admitting: *Deleted

## 2012-06-01 MED ORDER — LISINOPRIL-HYDROCHLOROTHIAZIDE 20-12.5 MG PO TABS: 1.0000 | ORAL_TABLET | Freq: Every day | ORAL | Status: DC

## 2012-07-05 ENCOUNTER — Telehealth: Payer: Self-pay | Admitting: General Practice

## 2012-07-06 MED ORDER — PRAVASTATIN SODIUM 20 MG PO TABS: 20.0000 mg | ORAL_TABLET | Freq: Every day | ORAL | Status: DC

## 2012-07-06 NOTE — Telephone Encounter (Signed)
done

## 2012-08-03 ENCOUNTER — Encounter: Payer: Self-pay | Admitting: Physician Assistant

## 2012-08-03 ENCOUNTER — Ambulatory Visit (INDEPENDENT_AMBULATORY_CARE_PROVIDER_SITE_OTHER): Payer: BC Managed Care – PPO | Admitting: Physician Assistant

## 2012-08-03 VITALS — BP 132/90 | HR 76 | Ht 71.0 in | Wt 234.8 lb

## 2012-08-03 DIAGNOSIS — R0602 Shortness of breath: Secondary | ICD-10-CM

## 2012-08-03 DIAGNOSIS — R002 Palpitations: Secondary | ICD-10-CM

## 2012-08-03 DIAGNOSIS — I10 Essential (primary) hypertension: Secondary | ICD-10-CM

## 2012-08-03 MED ORDER — NADOLOL 20 MG PO TABS: 20.0000 mg | ORAL_TABLET | Freq: Every day | ORAL | Status: DC

## 2012-08-03 MED ORDER — ATENOLOL 25 MG PO TABS: 25.0000 mg | ORAL_TABLET | Freq: Every day | ORAL | Status: DC

## 2012-08-03 MED ORDER — METOPROLOL SUCCINATE ER 25 MG PO TB24: 25.0000 mg | ORAL_TABLET | Freq: Every day | ORAL | Status: DC

## 2012-08-03 NOTE — Patient Instructions (Addendum)
Your physician has recommended you make the following change in your medication:  1.  Start Toprol XL ( 25 mg ) daily if this does not give you any symptoms call our office to get a 30 day supply sent in to your pharmacy if you do have symptoms try 2.  Atenolol ( 25 mg) daily if this does not give you any symptoms call our office to get a 30 day supply sent in to your pharmacy if you do have symptoms try 3.  Nadolol ( 20 mg )  Daily  I am giving you a paper prescriptions for three today  Your physician has requested that you have an exercise tolerance test.Dyspnea For  further information please visit [email or web address] Please also follow instruction sheet, as given.  Your physician recommends that you keep your scheduled follow-up appointment with Tereso Newcomer, PA-C on Thursday, 7/17 @ 12:10

## 2012-08-03 NOTE — Progress Notes (Signed)
[address], [address] [place], [place]  [address] Phone: [phone] Fax:  [phone]  Date:  08/03/2012   ID:  Katie Allen, DOB [birth year], MRN [id number]  PCP:  Rudi Heap, MD  Cardiologist:  Dr. Rollene Rotunda     History of Present Illness: Katie Allen is a 62 y.o. female who returns for the evaluation of palpitations.  She has a hx of HTN, HL, hypothyroidism and FHx of SCD (brother - has AICD). She saw Dr. Antoine Poche back in 10/2009. She had no ischemic changes on an ETT but became quite dyspneic in stage I and achieved her target heart rate rapidly. Echocardiogram at that time was normal. The patient was admitted to the hospital 08/2011 with a suspected TIA. She presented with right-sided facial droop. Symptoms resolved before thrombolytics were given. Carotid Dopplers were negative for ICA stenosis. Echo 09/06/11: EF 55-60%, normal wall motion, grade 1 diastolic dysfunction, mild LAE, mild RAE, PASP 23. She was discharged to home on Plavix. Hormone replacement therapy was discontinued. Follow up with neurology was planned.  Over the last 1-2 weeks, she has noted increased palpitations. These palpitations seem to be very similar to her prior palpitations described as PACs. However, they were more frequent. She also has noted some rapid palpitations. She had associated dyspnea and lightheadedness. She denies syncope. She denies chest discomfort. She describes NYHA class IIb symptoms. She denies orthopnea, PND. She does have mild pedal edema.  Labs (4/14):  K 4, Cr 0.72, ALT 24, LDL 83, Hgb 14.6, TSH 1.013  Wt Readings from Last 3 Encounters:  08/03/12 234 lb 12.8 oz (106.505 kg)  09/07/11 222 lb (100.699 kg)  11/06/09 228 lb (103.42 kg)     Past Medical History  Diagnosis Date  . Hypertension     x years  . Hyperlipidemia     x years  . Hashimoto's thyroiditis   . Shingles     Current Outpatient Prescriptions  Medication Sig Dispense Refill  . Ascorbic  Acid (VITAMIN C) 500 MG tablet Take 500 mg by mouth daily.        Marland Kitchen b complex vitamins tablet Take 1 tablet by mouth daily.        . Cholecalciferol (PA VITAMIN D-3) 2000 UNITS CAPS Take by mouth.        . clopidogrel (PLAVIX) 75 MG tablet Take 1 tablet (75 mg total) by mouth daily with breakfast.  30 tablet  5  . levothyroxine (SYNTHROID, LEVOTHROID) 50 MCG tablet Take 25-50 mcg by mouth daily. Alternates and 50 mcg daily      . lisinopril-hydrochlorothiazide (PRINZIDE,ZESTORETIC) 20-12.5 MG per tablet Take 1 tablet by mouth daily.  90 tablet  0  . Multiple Vitamin (MULTIVITAMIN) tablet Take 1 tablet by mouth daily.        . pravastatin (PRAVACHOL) 40 MG tablet Take 40 mg by mouth daily.       No current facility-administered medications for this visit.    Allergies:    Allergies  Allergen Reactions  . Penicillins Swelling    Swollen tongue    Social History:  The patient  reports that she has never smoked. She does not have any smokeless tobacco history on file. She reports that she does not drink alcohol or use illicit drugs.   ROS:  Please see the history of present illness.   She has a chronic cough from congestion. She drinks 2-3 cups of coffee per day.   All other systems reviewed and  negative.   PHYSICAL EXAM: VS:  BP 132/90  Pulse 76  Ht 5\' 11"  (1.803 m)  Wt 234 lb 12.8 oz (106.505 kg)  BMI 32.76 kg/m2 Well nourished, well developed, in no acute distress HEENT: normal Neck: no JVD Vascular: No carotid bruits Cardiac:  normal S1, S2; RRR; no murmur Lungs:  clear to auscultation bilaterally, no wheezing, rhonchi or rales Abd: soft, nontender, no hepatomegaly Ext: no edema Skin: warm and dry Neuro:  CNs 2-12 intact, no focal abnormalities noted  EKG:  NSR, HR 76, normal axis, no ischemic changes, PVC     ASSESSMENT AND PLAN:  1. Palpitations: I suspect she is experiencing symptomatic PVCs and/or PACs. She has had increased stress recently. She is now working 3  jobs. She has a moderate amount of caffeine intake. However, this has not changed recently. At this point, I do not believe that she is describing atrial fibrillation. We discussed the possibility of proceeding with evaluation with Holter versus event monitoring. She does have a history of TIA. If she had atrial fibrillation, she would require anticoagulation. We discussed starting on beta blocker therapy for symptomatic relief. At this point, I have given her a prescription for 3 different beta blockers to try. If her palpitations continue or worsen, we will then pursue monitoring.  Recent TSH was normal. 2. Hypertension: Start beta blocker therapy as noted. Continue lisinopril/HCTZ. 3. Dyspnea: Plan back in 2011 was to proceed with follow up stress testing in one year. She has a family history of sudden cardiac death. This is in her twin brother. I will arrange follow up POET.  Recent echocardiogram demonstrated normal LV function. 4. Disposition: Follow up in 2-3 months with Dr. Antoine Poche or me.  Signed, Tereso Newcomer, PA-C  08/03/2012 4:19 PM

## 2012-08-22 ENCOUNTER — Ambulatory Visit (INDEPENDENT_AMBULATORY_CARE_PROVIDER_SITE_OTHER): Payer: BC Managed Care – PPO | Admitting: Nurse Practitioner

## 2012-08-22 ENCOUNTER — Encounter: Payer: Self-pay | Admitting: Nurse Practitioner

## 2012-08-22 VITALS — BP 111/91 | HR 93 | Resp 20

## 2012-08-22 DIAGNOSIS — R0602 Shortness of breath: Secondary | ICD-10-CM

## 2012-08-22 DIAGNOSIS — R002 Palpitations: Secondary | ICD-10-CM

## 2012-08-22 DIAGNOSIS — I10 Essential (primary) hypertension: Secondary | ICD-10-CM

## 2012-08-22 NOTE — Progress Notes (Signed)
Exercise Treadmill Test  Pre-Exercise Testing Evaluation Rhythm: normal sinus  Rate: 81     Test  Exercise Tolerance Test Ordering MD: Angelina Sheriff, MD  Interpreting ZO:XWRU Gerhardt,NP  Unique Test No: 1  Treadmill:  1  Indication for ETT: Shortness of breath  Contraindication to ETT: No   Stress Modality: exercise - treadmill  Cardiac Imaging Performed: non   Protocol: standard Bruce - maximal  Max BP:  171/102  Max MPHR (bpm):  160 85% MPR (bpm):  136   MPHR obtained (bpm):  150 % MPHR obtained:  94%  Reached 85% MPHR (min:sec):  1:54 Total Exercise Time (min-sec):  2:50  Workload in METS:  4.9 Borg Scale: 15  Reason ETT Terminated:  patient's desire to stop    ST Segment Analysis At Rest: normal ST segments - no evidence of significant ST depression With Exercise: no evidence of significant ST depression  Other Information Arrhythmia:  Yes; Frequent PVC's and one couplet during the stress portion.  Angina during ETT:  absent (0) Quality of ETT:  diagnostic  ETT Interpretation:  normal - no evidence of ischemia by ST analysis  Comments: Patient presents today for routine GXT. Saw Tereso Newcomer, [place] last month with complaints of palpitations. Her history is positive for HTN, HLD, TIA on plavix, hypothyroidism and family history of SCD (brother with AICD). She was given prescriptions for 3 beta blockers to try. She only took one dose of one and felt bad so she has NOT taken any. Has cut back on her caffeine. Is working 3 jobs. Not exercising and admits that she is deconditioned. She notes that she feels better than when she was here last month.   Today, she exercised on the standard Bruce protocol for only 2:50. She has poor exercise tolerance. Mildly hypertensive BP response. Clinically with shortness of breath. No chest pain. EKG negative for ischemia. She did have frequent PVCs and one couplet during her exercise phase.   Recommendations: CV risk factor modification with  diet/weight loss/exercise Monitoring her BP Her palpitations are no longer symptomatic since she has stopped caffeine - EF is normal by echo one year ago.   I will see her back in 3 months.  Patient is agreeable to this plan and will call if any problems develop in the interim.   Rosalio Macadamia, RN, ANP-C [facility] [address] [place], [place]  [address]

## 2012-08-22 NOTE — Patient Instructions (Signed)
Regular exercise is encouraged  I will see you in 3 months  Monitor your BP at home and keep a record  Call the [facility] office at [phone] if you have any questions, problems or concerns.

## 2012-08-24 ENCOUNTER — Ambulatory Visit: Payer: BC Managed Care – PPO | Admitting: Physician Assistant

## 2012-08-30 ENCOUNTER — Other Ambulatory Visit: Payer: Self-pay | Admitting: Family Medicine

## 2012-10-05 ENCOUNTER — Ambulatory Visit: Payer: Self-pay | Admitting: Neurology

## 2012-10-25 ENCOUNTER — Encounter: Payer: Self-pay | Admitting: Neurology

## 2012-11-22 ENCOUNTER — Ambulatory Visit: Payer: BC Managed Care – PPO | Admitting: Nurse Practitioner

## 2012-12-04 ENCOUNTER — Encounter: Payer: Self-pay | Admitting: General Practice

## 2012-12-04 ENCOUNTER — Ambulatory Visit (INDEPENDENT_AMBULATORY_CARE_PROVIDER_SITE_OTHER): Payer: BC Managed Care – PPO | Admitting: General Practice

## 2012-12-04 ENCOUNTER — Telehealth: Payer: Self-pay | Admitting: Neurology

## 2012-12-04 VITALS — BP 156/93 | HR 89 | Temp 98.0°F | Ht 72.0 in | Wt 229.0 lb

## 2012-12-04 DIAGNOSIS — Z23 Encounter for immunization: Secondary | ICD-10-CM

## 2012-12-04 DIAGNOSIS — Z09 Encounter for follow-up examination after completed treatment for conditions other than malignant neoplasm: Secondary | ICD-10-CM

## 2012-12-04 DIAGNOSIS — E063 Autoimmune thyroiditis: Secondary | ICD-10-CM

## 2012-12-04 DIAGNOSIS — E785 Hyperlipidemia, unspecified: Secondary | ICD-10-CM

## 2012-12-04 DIAGNOSIS — I10 Essential (primary) hypertension: Secondary | ICD-10-CM

## 2012-12-04 LAB — POCT CBC
Lymph, poc: 3.2 (ref 0.6–3.4)
MCH, POC: 27.8 pg (ref 27–31.2)
MCHC: 32.3 g/dL (ref 31.8–35.4)
MPV: 8.3 fL (ref 0–99.8)
POC Granulocyte: 4.8 (ref 2–6.9)
POC LYMPH PERCENT: 39.8 %L (ref 10–50)
Platelet Count, POC: 267 10*3/uL (ref 142–424)
RDW, POC: 13.8 %
WBC: 8.1 10*3/uL (ref 4.6–10.2)

## 2012-12-04 MED ORDER — LISINOPRIL-HYDROCHLOROTHIAZIDE 20-12.5 MG PO TABS: 1.0000 | ORAL_TABLET | Freq: Every day | ORAL | Status: DC

## 2012-12-04 MED ORDER — LEVOTHYROXINE SODIUM 50 MCG PO TABS: 25.0000 ug | ORAL_TABLET | Freq: Every day | ORAL | Status: DC

## 2012-12-04 MED ORDER — PRAVASTATIN SODIUM 40 MG PO TABS: 20.0000 mg | ORAL_TABLET | Freq: Every day | ORAL | Status: DC

## 2012-12-04 NOTE — Patient Instructions (Signed)

## 2012-12-04 NOTE — Progress Notes (Signed)
  Subjective:    Patient ID: Katie Allen, female    DOB: Aug 26, 1950, 62 y.o.   MRN: [id number]  HPI Patient presents today for chronic health follow up. She has history hypertension, hyperlipidemia, TIA (July 2013), and . She reports taking medications as prescribed. Plavix being prescribed by cardiologist. She reports checking blood pressure maybe once weekly and ranges 130's/80-90. Denies keeping diary or taking blood pressures on regular basis as she should. She denies regular exercise, but eats healthy. Reports working three jobs.     Review of Systems  Constitutional: Negative for fever and chills.  Respiratory: Negative for cough, chest tightness, shortness of breath and wheezing.   Cardiovascular: Negative for chest pain and palpitations.  Gastrointestinal: Negative for abdominal pain, diarrhea, constipation and blood in stool.  Genitourinary: Negative for dysuria, hematuria and difficulty urinating.  Musculoskeletal: Negative for back pain, neck pain and neck stiffness.  Neurological: Negative for dizziness, weakness and headaches.       Objective:   Physical Exam  Constitutional: She is oriented to person, place, and time. She appears well-developed and well-nourished.  Cardiovascular: Normal rate, regular rhythm and normal heart sounds.   Pulmonary/Chest: Effort normal and breath sounds normal.  Abdominal: Soft. Bowel sounds are normal. She exhibits no distension. There is no tenderness.  Neurological: She is alert and oriented to person, place, and time.  Skin: Skin is warm and dry.  Psychiatric: She has a normal mood and affect.          Assessment & Plan:  1. Hypertension  - CMP14+EGFR - lisinopril-hydrochlorothiazide (PRINZIDE,ZESTORETIC) 20-12.5 MG per tablet; Take 1 tablet by mouth daily.  Dispense: 90 tablet; Refill: 1  2. Hyperlipidemia  - NMR, lipoprofile - pravastatin (PRAVACHOL) 40 MG tablet; Take 0.5 tablets (20 mg total) by mouth daily.  Dispense:  90 tablet; Refill: 1  3. Hashimoto's disease  - TSH - levothyroxine (SYNTHROID, LEVOTHROID) 50 MCG tablet; Take 0.5-1 tablets (25-50 mcg total) by mouth daily. Alternates and 50 mcg daily  Dispense: 90 tablet; Refill: 1  4. Follow-up exam, 3-6 months since previous exam  - POCT CBC  5. Need for prophylactic vaccination and inoculation against influenza -Continue all current medications Labs pending F/u in 3 months Discussed exercise and diet  Patient verbalized understanding Coralie Keens, FNP-C

## 2012-12-05 MED ORDER — CLOPIDOGREL BISULFATE 75 MG PO TABS: 75.0000 mg | ORAL_TABLET | Freq: Every day | ORAL | Status: DC

## 2012-12-05 NOTE — Telephone Encounter (Signed)
Rx sent.  Called patient, line was busy.

## 2012-12-06 ENCOUNTER — Other Ambulatory Visit: Payer: Self-pay | Admitting: General Practice

## 2012-12-06 LAB — CMP14+EGFR
ALT: 22 IU/L (ref 0–32)
Albumin: 4.6 g/dL (ref 3.6–4.8)
Alkaline Phosphatase: 81 IU/L (ref 39–117)
BUN/Creatinine Ratio: 16 (ref 11–26)
BUN: 14 mg/dL (ref 8–27)
CO2: 23 mmol/L (ref 18–29)
Chloride: 101 mmol/L (ref 97–108)
GFR calc Af Amer: 83 mL/min/{1.73_m2} (ref >59)
Glucose: 88 mg/dL (ref 65–99)
Potassium: 3.7 mmol/L (ref 3.5–5.2)
Total Bilirubin: 0.4 mg/dL (ref 0.0–1.2)

## 2012-12-06 LAB — NMR, LIPOPROFILE
Cholesterol: 190 mg/dL (ref <200)
HDL Cholesterol by NMR: 41 mg/dL (ref >=40)
HDL Particle Number: 31.2 umol/L (ref >=30.5)
LDL Particle Number: 1709 nmol/L — ABNORMAL HIGH (ref <1000)
LDL Size: 20.6 nm (ref >20.5)
LDLC SERPL CALC-MCNC: 95 mg/dL (ref <100)
LP-IR Score: 81 — ABNORMAL HIGH (ref <=45)
Small LDL Particle Number: 896 nmol/L — ABNORMAL HIGH (ref <=527)
Triglycerides by NMR: 271 mg/dL — ABNORMAL HIGH (ref <150)

## 2012-12-06 LAB — TSH: TSH: 0.867 u[IU]/mL (ref 0.450–4.500)

## 2012-12-12 ENCOUNTER — Encounter: Payer: Self-pay | Admitting: Nurse Practitioner

## 2012-12-12 ENCOUNTER — Ambulatory Visit (INDEPENDENT_AMBULATORY_CARE_PROVIDER_SITE_OTHER): Payer: BC Managed Care – PPO | Admitting: Nurse Practitioner

## 2012-12-12 VITALS — BP 148/80 | HR 70 | Ht 71.0 in | Wt 230.5 lb

## 2012-12-12 DIAGNOSIS — R002 Palpitations: Secondary | ICD-10-CM

## 2012-12-12 DIAGNOSIS — I10 Essential (primary) hypertension: Secondary | ICD-10-CM

## 2012-12-12 NOTE — Patient Instructions (Signed)
Stay on your medicines  Monitor your blood pressure - goal is to stay below 135/85 or less  GXT in July   Keep working on your diet/exercise/weight loss  Call the [facility] office at [phone] if you have any questions, problems or concerns.

## 2012-12-12 NOTE — Progress Notes (Signed)
Katie Allen Date of Birth: [birth year] Medical Record #[id number]  History of Present Illness: Katie Allen is seen back today for a 4 month check. Seen for Dr. Antoine Poche. She has had HTN, HLD TIA on Plavix, hypothyroidism and family history of SCD (twin brother with ICD). Seen back earlier this year with palpitations - given RX for 3 different beta blockers - she only tried one and felt bad - did not try anything else - opted for lifestyle changes. EF is normal per past echo.   I saw her in July for her GXT - this was normal without evidence of ischemia but did have frequent PVCs and one couplet along with poor exercise tolerance. Her palpitations were no longer bothersome due to her cutting back her caffeine and trying to modify her lifestyle.   Comes back today. Here alone. Doing well. Has gotten a bike- working on her weight. No real problem with her palpitations. Stays away from caffeine. Trying to eat better. Has had recent lipids with her PCP. Overall, thinks she is doing ok. 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 Has good support system at home. No chest pain. Not short of breath. BP averaging 135/85.   Current Outpatient Prescriptions  Medication Sig Dispense Refill  . Ascorbic Acid (VITAMIN C) 500 MG tablet Take 500 mg by mouth daily.        Marland Kitchen b complex vitamins tablet Take 1 tablet by mouth daily.        . Cholecalciferol (PA VITAMIN D-3) 2000 UNITS CAPS Take by mouth.        . clopidogrel (PLAVIX) 75 MG tablet Take 1 tablet (75 mg total) by mouth daily with breakfast.  30 tablet  0  . levothyroxine (SYNTHROID, LEVOTHROID) 50 MCG tablet Take 0.5-1 tablets (25-50 mcg total) by mouth daily. Alternates and 50 mcg daily  90 tablet  1  . lisinopril-hydrochlorothiazide (PRINZIDE,ZESTORETIC) 20-12.5 MG per tablet Take 1 tablet by mouth daily.  90 tablet  1  . Multiple Vitamin (MULTIVITAMIN) tablet Take 1 tablet by mouth  daily.        . pravastatin (PRAVACHOL) 40 MG tablet Take 0.5 tablets (20 mg total) by mouth daily.  90 tablet  1   No current facility-administered medications for this visit.    Allergies  Allergen Reactions  . Penicillins Swelling    Swollen tongue    Past Medical History  Diagnosis Date  . Hypertension     x years  . Hyperlipidemia     x years  . Hashimoto's thyroiditis   . Shingles   . Heart palpitations   . TIA (transient ischemic attack)     Past Surgical History  Procedure Laterality Date  . Cholecystectomy      History  Smoking status  . Never Smoker   Smokeless tobacco  . Not on file    History  Alcohol Use No    Comment: Social    Family History  Problem Relation Age of Onset  . Colon cancer Mother   . Heart attack Father 34    CABG in his 39s  . Cardiomyopathy Brother 56  . Stroke      Review of Systems: The review of systems is per the HPI.  All other systems were reviewed and are negative.  Physical Exam: BP 148/80  Pulse 70  Ht 5\' 11"  (1.803 m)  Wt 230 lb 8 oz (104.554 kg)  BMI 32.16 kg/m2  Patient is very pleasant and in no acute distress. Weight is down 4 pounds. Skin is warm and dry. Color is normal.  HEENT is unremarkable. Normocephalic/atraumatic. PERRL. Sclera are nonicteric. Neck is supple. No masses. No JVD. Lungs are clear. Cardiac exam shows a regular rate and rhythm. Abdomen is soft. Extremities are without edema. Gait and ROM are intact. No gross neurologic deficits noted.  LABORATORY DATA:  Lab Results  Component Value Date   WBC 8.1 12/04/2012   HGB 14.3 12/04/2012   HCT 44.1 12/04/2012   PLT 252 09/06/2011   GLUCOSE 88 12/04/2012   CHOL 190 12/04/2012   TRIG 173* 05/12/2012   HDL 38* 09/07/2011   LDLCALC 83 05/12/2012   ALT 22 12/04/2012   AST 16 12/04/2012   NA 142 12/04/2012   K 3.7 12/04/2012   CL 101 12/04/2012   CREATININE 0.87 12/04/2012   BUN 14 12/04/2012   CO2 23 12/04/2012   TSH 0.867 12/04/2012   INR  1.10 09/06/2011   HGBA1C 6.0* 09/07/2011     Assessment / Plan: 1. Palpitations - really not bothersome for her.   2.  HTN - satisfactory outpatient control. She will continue to monitor.   3. FH of SCD - would recommend periodic testing along with CV risk factor modification   4. HLD - on statin therapy - labs checked by PCP  Plan for GXT in July.   Patient is agreeable to this plan and will call if any problems develop in the interim.   Rosalio Macadamia, RN, ANP-C [facility] [address] [place], [place]  [address]

## 2012-12-20 ENCOUNTER — Encounter: Payer: Self-pay | Admitting: Neurology

## 2012-12-20 ENCOUNTER — Ambulatory Visit (INDEPENDENT_AMBULATORY_CARE_PROVIDER_SITE_OTHER): Payer: BC Managed Care – PPO | Admitting: Neurology

## 2012-12-20 VITALS — BP 137/88 | HR 77 | Temp 98.5°F | Ht 71.0 in | Wt 229.0 lb

## 2012-12-20 DIAGNOSIS — G459 Transient cerebral ischemic attack, unspecified: Secondary | ICD-10-CM

## 2012-12-20 NOTE — Patient Instructions (Signed)
Continue Plavix for second stroke prevention with strict control of hypertension with blood pressure goal below 130/90 and lipids with LDL cholesterol goal below 100 mg percent. Continue regular exercise, diet and maintain ideal weight. Return for followup in a year. Check followup carotid ultrasound study.

## 2012-12-20 NOTE — Progress Notes (Signed)
[facility] [address] [place] [place] [address] [phone]       OFFICE FOLLOW-UP NOTE  [name] Date of Birth:  10/25/1950 Medical Record Number:  [id number]   HPI: 62 year Caucasian lady with history of left brain TIA in July 2013 from small vessel disease with vascular risk factors of hypertension and hyperlipidemia  Update 12/20/12 She returns for followup of the last visit on 11/16/11. She continues to do well from neurovascular standpoint without recurrent stroke or TIA symptoms. She is tolerating Plavix well with only minor bruising. She states her blood pressure is quite good and stays in the 130s. She had lipid profile checked 3 weeks ago and hit her cholesterol was elevated and her primary physician increased her pravastatin to 40 mg. She has no other complaints today. ROS:   14 system review of systems is positive for  palpitations, easy bruising and hot flashes only. PMH:  Past Medical History  Diagnosis Date  . Hypertension     x years  . Hyperlipidemia     x years  . Hashimoto's thyroiditis   . Shingles   . Heart palpitations   . TIA (transient ischemic attack)     Social History:  History   Social History  . Marital Status: Married    Spouse Name: Nedra Hai    Number of Children: 1  . Years of Education: HS   Occupational History  .      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 Smoking status: Never Smoker   . Smokeless tobacco: Never Used  . Alcohol Use: No     Comment: Social  . Drug Use: No  . Sexual Activity: Not on file   Other Topics Concern  . Not on file   Social History Narrative   Works as Loss adjuster, [employer], [employer]. Has a 78 year old son.   Patient lives at home with spouse.   Caffeine Use: 4 cups daily    Medications:   Current Outpatient Prescriptions on File Prior to Visit  Medication Sig Dispense Refill  . Ascorbic Acid (VITAMIN C) 500 MG tablet Take 500 mg by mouth daily.         Marland Kitchen b complex vitamins tablet Take 1 tablet by mouth daily.        . Cholecalciferol (PA VITAMIN D-3) 2000 UNITS CAPS Take by mouth.        . clopidogrel (PLAVIX) 75 MG tablet Take 1 tablet (75 mg total) by mouth daily with breakfast.  30 tablet  0  . levothyroxine (SYNTHROID, LEVOTHROID) 50 MCG tablet Take 0.5-1 tablets (25-50 mcg total) by mouth daily. Alternates and 50 mcg daily  90 tablet  1  . lisinopril-hydrochlorothiazide (PRINZIDE,ZESTORETIC) 20-12.5 MG per tablet Take 1 tablet by mouth daily.  90 tablet  1  . Multiple Vitamin (MULTIVITAMIN) tablet Take 1 tablet by mouth daily.        . pravastatin (PRAVACHOL) 40 MG tablet Take 0.5 tablets (20 mg total) by mouth daily.  90 tablet  1   No current facility-administered medications on file prior to visit.    Allergies:   Allergies  Allergen Reactions  . Penicillins Swelling    Swollen tongue    Physical Exam General: well developed, well nourished, seated, in no evident distress Head: head normocephalic and atraumatic. Oropharynx benign Neck: supple with no carotid or supraclavicular bruits Cardiovascular: regular rate and rhythm, no murmurs Musculoskeletal: no deformity Skin:  no rash/petichiae Vascular:  Normal pulses all extremities Filed Vitals:   12/20/12 1444  BP: 137/88  Pulse: 77  Temp: 98.5 F (36.9 C)    Neurologic Exam Mental Status: Awake and fully alert. Oriented to place and time. Recent and remote memory intact. Attention span, concentration and fund of knowledge appropriate. Mood and affect appropriate.  Cranial Nerves: Fundoscopic exam reveals sharp disc margins. Pupils equal, briskly reactive to light. Extraocular movements full without nystagmus. Visual fields full to confrontation. Hearing intact. Facial sensation intact. Face, tongue, palate moves normally and symmetrically.  Motor: Normal bulk and tone. Normal strength in all tested extremity muscles. Sensory.: intact to touch and pinprick and  vibratory sensation.  Coordination: Rapid alternating movements normal in all extremities. Finger-to-nose and heel-to-shin performed accurately bilaterally. Gait and Station: Arises from chair without difficulty. Stance is normal. Gait demonstrates normal stride length and balance . Able to heel, toe and tandem walk without difficulty.  Reflexes: 1+ and symmetric. Toes downgoing.      ASSESSMENT: 62 year Caucasian lady with history of left brain TIA in July 2013 from small vessel disease with vascular risk factors of hypertension and hyperlipidemia .She is doing well from neurovascular standpoint.    PLAN:  Continue Plavix for second stroke prevention with strict control of hypertension with blood pressure goal below 130/90 and lipids with LDL cholesterol goal below 100 mg percent. Continue regular exercise, diet and maintain ideal weight. Return for followup in a year. Check followup carotid ultrasound study.

## 2012-12-26 ENCOUNTER — Ambulatory Visit: Payer: Self-pay | Admitting: Neurology

## 2013-01-30 ENCOUNTER — Telehealth: Payer: Self-pay | Admitting: General Practice

## 2013-01-31 ENCOUNTER — Other Ambulatory Visit: Payer: Self-pay

## 2013-01-31 DIAGNOSIS — E785 Hyperlipidemia, unspecified: Secondary | ICD-10-CM

## 2013-01-31 MED ORDER — PRAVASTATIN SODIUM 40 MG PO TABS: 40.0000 mg | ORAL_TABLET | Freq: Every day | ORAL | Status: DC

## 2013-02-28 ENCOUNTER — Other Ambulatory Visit: Payer: Self-pay | Admitting: General Practice

## 2013-02-28 DIAGNOSIS — E785 Hyperlipidemia, unspecified: Secondary | ICD-10-CM

## 2013-02-28 MED ORDER — PRAVASTATIN SODIUM 40 MG PO TABS: 40.0000 mg | ORAL_TABLET | Freq: Every day | ORAL | Status: DC

## 2013-03-09 ENCOUNTER — Encounter: Payer: Self-pay | Admitting: General Practice

## 2013-03-09 ENCOUNTER — Ambulatory Visit (INDEPENDENT_AMBULATORY_CARE_PROVIDER_SITE_OTHER): Payer: BC Managed Care – PPO | Admitting: General Practice

## 2013-03-09 ENCOUNTER — Encounter (INDEPENDENT_AMBULATORY_CARE_PROVIDER_SITE_OTHER): Payer: Self-pay

## 2013-03-09 VITALS — BP 145/92 | HR 75 | Temp 98.2°F | Ht <= 58 in | Wt 231.0 lb

## 2013-03-09 DIAGNOSIS — E063 Autoimmune thyroiditis: Secondary | ICD-10-CM

## 2013-03-09 DIAGNOSIS — I2510 Atherosclerotic heart disease of native coronary artery without angina pectoris: Secondary | ICD-10-CM

## 2013-03-09 DIAGNOSIS — E785 Hyperlipidemia, unspecified: Secondary | ICD-10-CM

## 2013-03-09 DIAGNOSIS — I10 Essential (primary) hypertension: Secondary | ICD-10-CM

## 2013-03-09 DIAGNOSIS — K219 Gastro-esophageal reflux disease without esophagitis: Secondary | ICD-10-CM

## 2013-03-09 MED ORDER — CLOPIDOGREL BISULFATE 75 MG PO TABS: 75.0000 mg | ORAL_TABLET | Freq: Every day | ORAL | Status: DC

## 2013-03-09 MED ORDER — PRAVASTATIN SODIUM 40 MG PO TABS: 40.0000 mg | ORAL_TABLET | Freq: Every day | ORAL | Status: DC

## 2013-03-09 MED ORDER — LEVOTHYROXINE SODIUM 50 MCG PO TABS: ORAL_TABLET | ORAL | Status: DC

## 2013-03-09 NOTE — Progress Notes (Signed)
Subjective:    Patient ID: Katie Allen, female    DOB: 04/30/1950, 62 y.o.   MRN: [id number]  HPI Patient presents today for chronic health follow up. History of hypertension, hyperlipidemia, TIA (July 2013), Hashimoto disease and GERD . She reports taking medications as prescribed. Plavix being prescribed by cardiologist. She denies regular exercise, but eats healthy.       Review of Systems  Constitutional: Negative for fever and chills.  Respiratory: Negative for cough, chest tightness, shortness of breath and wheezing.   Cardiovascular: Negative for chest pain and palpitations.  Gastrointestinal: Negative for abdominal pain, diarrhea, constipation and blood in stool.  Genitourinary: Negative for dysuria, hematuria and difficulty urinating.  Musculoskeletal: Negative for back pain, neck pain and neck stiffness.  Neurological: Negative for dizziness, weakness and headaches.       Objective:   Physical Exam  Constitutional: She is oriented to person, place, and time. She appears well-developed and well-nourished.  Cardiovascular: Normal rate, regular rhythm and normal heart sounds.   Pulmonary/Chest: Effort normal and breath sounds normal.  Abdominal: Soft. Bowel sounds are normal. She exhibits no distension. There is no tenderness.  Neurological: She is alert and oriented to person, place, and time.  Skin: Skin is warm and dry.  Psychiatric: She has a normal mood and affect.          Assessment & Plan:  1. HTN (hypertension)  - CMP14+EGFR  2. Hashimoto's disease  - levothyroxine (SYNTHROID, LEVOTHROID) 50 MCG tablet; Alternates 25mcg and 50 mcg daily  Dispense: 90 tablet; Refill: 1  3. Hyperlipidemia  - pravastatin (PRAVACHOL) 40 MG tablet; Take 1 tablet (40 mg total) by mouth daily.  Dispense: 90 tablet; Refill: 1  4. GERD (gastroesophageal reflux disease)   5. CAD (coronary artery disease) - clopidogrel (PLAVIX) 75 MG tablet; Take 1 tablet (75 mg total)  by mouth daily with breakfast.  Dispense: 90 tablet; Refill: 1 Continue all current medications Labs pending F/u in 3 months Discussed benefits of regular exercise and healthy eating Patient verbalized understanding Mae E. Haliburton, FNP-C   

## 2013-03-09 NOTE — Patient Instructions (Signed)

## 2013-03-10 LAB — CMP14+EGFR
A/G RATIO: 2.1 (ref 1.1–2.5)
ALBUMIN: 4.6 g/dL (ref 3.6–4.8)
ALT: 25 IU/L (ref 0–32)
AST: 17 IU/L (ref 0–40)
Alkaline Phosphatase: 80 IU/L (ref 39–117)
BILIRUBIN TOTAL: 0.5 mg/dL (ref 0.0–1.2)
BUN/Creatinine Ratio: 23 (ref 11–26)
BUN: 17 mg/dL (ref 8–27)
CALCIUM: 10.3 mg/dL (ref 8.7–10.3)
CO2: 27 mmol/L (ref 18–29)
Chloride: 99 mmol/L (ref 97–108)
Creatinine, Ser: 0.74 mg/dL (ref 0.57–1.00)
GFR, EST AFRICAN AMERICAN: 101 mL/min/{1.73_m2} (ref >59)
GFR, EST NON AFRICAN AMERICAN: 88 mL/min/{1.73_m2} (ref >59)
GLUCOSE: 84 mg/dL (ref 65–99)
Globulin, Total: 2.2 g/dL (ref 1.5–4.5)
Potassium: 3.9 mmol/L (ref 3.5–5.2)
Sodium: 142 mmol/L (ref 134–144)
TOTAL PROTEIN: 6.8 g/dL (ref 6.0–8.5)

## 2013-06-12 ENCOUNTER — Other Ambulatory Visit: Payer: Self-pay | Admitting: General Practice

## 2013-07-16 ENCOUNTER — Other Ambulatory Visit: Payer: Self-pay

## 2013-07-16 DIAGNOSIS — Z1231 Encounter for screening mammogram for malignant neoplasm of breast: Secondary | ICD-10-CM

## 2013-08-02 ENCOUNTER — Ambulatory Visit: Payer: BC Managed Care – PPO

## 2013-09-13 ENCOUNTER — Other Ambulatory Visit: Payer: Self-pay | Admitting: General Practice

## 2013-09-17 NOTE — Telephone Encounter (Signed)
Patient of Mae. Last seen in 1-15. Please advise on 90 day refill

## 2013-09-17 NOTE — Telephone Encounter (Signed)
This prescription may be refilled but patient must see a provider before more refills can be given to her. Please schedule her an appointment for followup

## 2013-09-18 ENCOUNTER — Other Ambulatory Visit: Payer: Self-pay | Admitting: General Practice

## 2013-10-08 ENCOUNTER — Telehealth: Payer: Self-pay | Admitting: Family

## 2013-10-09 ENCOUNTER — Ambulatory Visit: Payer: BC Managed Care – PPO | Admitting: Family

## 2013-10-09 NOTE — Telephone Encounter (Signed)
Pt will contact pharmacy closer to time to refill.

## 2013-10-16 ENCOUNTER — Other Ambulatory Visit: Payer: Self-pay | Admitting: General Practice

## 2013-10-17 NOTE — Telephone Encounter (Signed)
no more refills without being seen  

## 2013-10-17 NOTE — Telephone Encounter (Signed)
Last ov 1/15. ntbs

## 2013-10-18 ENCOUNTER — Other Ambulatory Visit: Payer: Self-pay | Admitting: *Deleted

## 2013-10-18 ENCOUNTER — Encounter (INDEPENDENT_AMBULATORY_CARE_PROVIDER_SITE_OTHER): Payer: BC Managed Care – PPO

## 2013-10-18 ENCOUNTER — Encounter: Payer: Self-pay | Admitting: *Deleted

## 2013-10-18 DIAGNOSIS — R002 Palpitations: Secondary | ICD-10-CM

## 2013-10-18 NOTE — Progress Notes (Signed)
Patient ID: Katie Allen, female   DOB: 12/14/1950, 63 y.o.   MRN: [id number] E-Cardio 24 hour holter monitor applied to patient.

## 2013-10-24 ENCOUNTER — Ambulatory Visit: Payer: BC Managed Care – PPO | Admitting: Family

## 2013-11-07 ENCOUNTER — Ambulatory Visit (INDEPENDENT_AMBULATORY_CARE_PROVIDER_SITE_OTHER): Payer: BC Managed Care – PPO | Admitting: Internal Medicine

## 2013-11-07 ENCOUNTER — Encounter: Payer: Self-pay | Admitting: Internal Medicine

## 2013-11-07 VITALS — BP 122/88 | HR 72 | Temp 98.3°F | Resp 14 | Ht 70.75 in | Wt 230.0 lb

## 2013-11-07 DIAGNOSIS — R002 Palpitations: Secondary | ICD-10-CM

## 2013-11-07 DIAGNOSIS — I10 Essential (primary) hypertension: Secondary | ICD-10-CM

## 2013-11-07 DIAGNOSIS — Z8673 Personal history of transient ischemic attack (TIA), and cerebral infarction without residual deficits: Secondary | ICD-10-CM

## 2013-11-07 DIAGNOSIS — E039 Hypothyroidism, unspecified: Secondary | ICD-10-CM

## 2013-11-07 NOTE — Progress Notes (Addendum)
Subjective:    Patient ID: Katie Allen, female    DOB: 02/12/1950, 63 y.o.   MRN: [id number]  HPI First visit for this 63 year old White Female with hypertension and hyperlipidemia. Patient has a history of left brain TIA in July 2013 while on estrogen replacement therapy. She complained of numbness in arm and leg for 2 weeks. MRI was unremarkable except for small vessel disease. Currently is on Plavix 75 mg daily. She has a history of hypothyroidism treated with thyroid replacement. She is on generic pravastatin. She takes omeprazole for GE reflux. Hypertension treated with lisinopril HCTZ 20/12.5 daily.   patient brings in an EKG from September 10 showing sinus rhythm. She has brought with her a 24-hour Holter monitor from September 10 showing sinus tachycardia with rate up to 126 bpm. Occasional PVCs and bigeminy. Rare PACs. Patient is aware of palpitations. This is been concerning to her.  Additional past medical history: Patient had cholecystectomy in 2010  She is allergic to penicillin it causes a swollen tongue  Codeine makes her nauseated  She had colonoscopy in November 2010  Social history: She is married. Completed high school. 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 She works some 12-15 hours a day. She resides in [place]. She does not smoke or consume alcohol. One son age 66. Lives in [place].  Family history: Father died at age 27 of an MI. Mother died at age 103 of colon cancer. Twin brothers age 42. Twin has history of pacemaker and defibrillator with atrial fibrillation. Another brother age 87 in good health with hypertension. Another brother age 16 in good health. Father with history of stroke. One sister in good health.     Review of Systems headaches, dizziness, or tinnitus, hot flashes, palpitations, urge urinary incontinence. Bruises  easily. Joint pain. Dry skin. Last menstrual period was in 2010. Last pelvic exam in 2014.       Objective:   Physical Exam  Constitutional: She is oriented to person, place, and time. She appears well-developed and well-nourished. No distress.  HENT:  Head: Normocephalic and atraumatic.  Right Ear: External ear normal.  Left Ear: External ear normal.  Mouth/Throat: Oropharynx is clear and moist. No oropharyngeal exudate.  Eyes: Conjunctivae and EOM are normal. Pupils are equal, round, and reactive to light. Right eye exhibits no discharge. Left eye exhibits no discharge. No scleral icterus.  Neck: Neck supple. No JVD present. No thyromegaly present.  Cardiovascular: Normal rate and normal heart sounds.   No murmur heard. Occasional irregular contractions  Pulmonary/Chest: Effort normal and breath sounds normal. No respiratory distress. She has no wheezes. She has no rales. She exhibits no tenderness.  Breast normal female  Abdominal: Soft. Bowel sounds are normal. She exhibits no distension and no mass. There is no tenderness. There is no rebound and no guarding.  Musculoskeletal: She exhibits no edema.  Lymphadenopathy:    She has no cervical adenopathy.  Neurological: She is alert and oriented to person, place, and time. No cranial nerve deficit. Coordination normal.  Skin: Skin is warm and dry. No rash noted. She is not diaphoretic.  Psychiatric: She has a normal mood and affect. Her behavior is normal. Judgment and thought content normal.  Vitals reviewed.         Assessment & Plan:   palpitations  Supraventricular tachycardia  Premature ventricular contractions  History of hypertension  History of TIA 2013 now on Plavix  Hypothyroidism  History of hyperlipidemia  Plan: Patient will start Lopressor 25 mg daily and return in 4 weeks.  She brings in lab work from September 10. Total CK was within normal limits. Potassium was 3.6. CBC was normal. Troponin 1 was  negative. TSH was normal.

## 2013-11-12 ENCOUNTER — Ambulatory Visit: Payer: BC Managed Care – PPO | Admitting: Family

## 2013-11-12 ENCOUNTER — Telehealth: Payer: Self-pay

## 2013-11-12 MED ORDER — METOPROLOL TARTRATE 25 MG PO TABS: 25.0000 mg | ORAL_TABLET | Freq: Every day | ORAL | Status: DC

## 2013-11-12 MED ORDER — METOPROLOL TARTRATE 25 MG PO TABS: 25.0000 mg | ORAL_TABLET | Freq: Two times a day (BID) | ORAL | Status: DC

## 2013-11-12 NOTE — Telephone Encounter (Signed)
Pharmacy called to clarify metoprolol directions.  Per note patient should take 1 25mg  daily.

## 2013-11-12 NOTE — Patient Instructions (Addendum)
Start metoprolol 25 mg daily. Continue other medications as previously prescribed.

## 2013-11-19 ENCOUNTER — Telehealth: Payer: Self-pay

## 2013-11-19 DIAGNOSIS — E063 Autoimmune thyroiditis: Secondary | ICD-10-CM

## 2013-11-19 MED ORDER — LEVOTHYROXINE SODIUM 50 MCG PO TABS: ORAL_TABLET | ORAL | Status: DC

## 2013-11-19 MED ORDER — CLOPIDOGREL BISULFATE 75 MG PO TABS: ORAL_TABLET | ORAL | Status: DC

## 2013-11-19 NOTE — Telephone Encounter (Signed)
Please call in  Plavix 75 mg daily x 6 months and Levothyroxine 0. 05 mg to take one tab po every other day and one half tab every other day with 6 month refills.

## 2013-11-19 NOTE — Telephone Encounter (Signed)
Patient is calling to request plavix and thyroid medication refilled to [facility] in [place].  Please advise.

## 2013-11-19 NOTE — Telephone Encounter (Signed)
Medications refilled

## 2013-11-22 ENCOUNTER — Ambulatory Visit: Payer: BC Managed Care – PPO | Admitting: Internal Medicine

## 2013-11-29 ENCOUNTER — Ambulatory Visit: Payer: BC Managed Care – PPO | Admitting: Internal Medicine

## 2013-12-04 ENCOUNTER — Ambulatory Visit (INDEPENDENT_AMBULATORY_CARE_PROVIDER_SITE_OTHER): Payer: BC Managed Care – PPO | Admitting: Cardiology

## 2013-12-04 ENCOUNTER — Encounter: Payer: Self-pay | Admitting: Cardiology

## 2013-12-04 VITALS — BP 140/80 | HR 72 | Ht 71.0 in | Wt 231.0 lb

## 2013-12-04 DIAGNOSIS — R079 Chest pain, unspecified: Secondary | ICD-10-CM

## 2013-12-04 NOTE — Progress Notes (Signed)
HPI The patient presents for followup of palpitations. She has a family history of sudden cardiac death of her brother who had an ICD.  She was a twin. She has had palpitations but has not tolerated beta blockers.  She did wear a monitor in September and have sinus tachycardia. There were occasional PVCs. She had ventricular couplets and PACs.  She had an episode of palpitations that woke her from her sleep. She subsequently wore a Holter monitor which is mentioned. Interestingly the episode of sinus tachycardia that she had around 12:45 PM was associated with dizziness. She was sitting to urinate at that time.  She has actually now been started on a very low dose of Toprol and seems to be tolerating this with fewer palpitations and no sustained episodes.  He denies any chest pressure, neck or arm discomfort. She's had no weight gain or edema.  She does report a great deal of stress. Of note I did review labs and her TSH and electrolytes were normal.    Allergies  Allergen Reactions  . Penicillins Swelling    Swollen tongue    Current Outpatient Prescriptions  Medication Sig Dispense Refill  . Ascorbic Acid (VITAMIN C) 500 MG tablet Take 500 mg by mouth daily.        Marland Kitchen b complex vitamins tablet Take 1 tablet by mouth daily.        . Cholecalciferol (PA VITAMIN D-3) 2000 UNITS CAPS Take by mouth.        . clopidogrel (PLAVIX) 75 MG tablet TAKE ONE TABLET BY MOUTH ONCE DAILY WITH BREAKFAST  90 tablet  1  . levothyroxine (SYNTHROID, LEVOTHROID) 50 MCG tablet Alternates 10mcg and 50 mcg daily  90 tablet  1  . lisinopril-hydrochlorothiazide (PRINZIDE,ZESTORETIC) 20-12.5 MG per tablet TAKE ONE TABLET BY MOUTH ONCE DAILY  90 tablet  0  . metoprolol tartrate (LOPRESSOR) 25 MG tablet Take 1 tablet (25 mg total) by mouth daily.  30 tablet  1  . Multiple Vitamin (MULTIVITAMIN) tablet Take 1 tablet by mouth daily.        Marland Kitchen omeprazole (PRILOSEC) 20 MG capsule Take 20 mg by mouth daily. OTC      .  pravastatin (PRAVACHOL) 40 MG tablet Take 1 tablet (40 mg total) by mouth daily.  90 tablet  1   No current facility-administered medications for this visit.    Past Medical History  Diagnosis Date  . Hypertension     x years  . Hyperlipidemia     x years  . Hashimoto's thyroiditis   . Shingles   . Heart palpitations   . TIA (transient ischemic attack)     Past Surgical History  Procedure Laterality Date  . Cholecystectomy      ROS:  As stated in the HPI and negative for all other systems.  PHYSICAL EXAM BP 140/80  Pulse 72  Ht 5\' 11"  (1.803 m)  Wt 231 lb (104.781 kg)  BMI 32.23 kg/m2 GENERAL:  Well appearing HEENT:  Pupils equal round and reactive, fundi not visualized, oral mucosa unremarkable NECK:  No jugular venous distention, waveform within normal limits, carotid upstroke brisk and symmetric, no bruits, no thyromegaly LUNGS:  Clear to auscultation bilaterally BACK:  No CVA tenderness CHEST:  Unremarkable HEART:  PMI not displaced or sustained,S1 and S2 within normal limits, no S3, no S4, no clicks, no rubs, no murmurs ABD:  Flat, positive bowel sounds normal in frequency in pitch, no bruits, no rebound, no guarding,  no midline pulsatile mass, no hepatomegaly, no splenomegaly EXT:  2 plus pulses throughout, no edema, no cyanosis no clubbing   EKG:  I will sinus rhythm, rate 72, axis within normal limits, intervals within normal limits, premature atrial contractions, S2 changes.  12/04/2013   ASSESSMENT AND PLAN  Palpitations -  At this point since she is tolerating a low dose of beta blocker I would suggest taking this to twice a day and she will for her with Dr. Renold Genta.  I would also like to see how she does on this dose during a POET (Plain Old Exercise Treadmill)  HTN -  The blood pressure is at target. No change in medications is indicated. We will continue with therapeutic lifestyle changes (TLC).

## 2013-12-04 NOTE — Patient Instructions (Signed)
Your physician recommends that you schedule a follow-up appointment in: 6 months with Dr. Hochrein  We are ordering a stress test  

## 2013-12-05 ENCOUNTER — Encounter: Payer: Self-pay | Admitting: Internal Medicine

## 2013-12-05 ENCOUNTER — Ambulatory Visit (INDEPENDENT_AMBULATORY_CARE_PROVIDER_SITE_OTHER): Payer: BC Managed Care – PPO | Admitting: Internal Medicine

## 2013-12-05 VITALS — BP 122/88 | HR 78 | Wt 233.0 lb

## 2013-12-05 DIAGNOSIS — R002 Palpitations: Secondary | ICD-10-CM

## 2013-12-05 DIAGNOSIS — E785 Hyperlipidemia, unspecified: Secondary | ICD-10-CM

## 2013-12-05 DIAGNOSIS — I10 Essential (primary) hypertension: Secondary | ICD-10-CM

## 2013-12-05 MED ORDER — METOPROLOL TARTRATE 25 MG PO TABS: ORAL_TABLET | ORAL | Status: DC

## 2013-12-05 MED ORDER — LISINOPRIL-HYDROCHLOROTHIAZIDE 20-12.5 MG PO TABS: ORAL_TABLET | ORAL | Status: DC

## 2013-12-05 NOTE — Patient Instructions (Addendum)
Increase  Lopressor to twice daily. Have exercise test. Continue  Lisinopril- HCTZ and switch to Lipitor.

## 2013-12-06 ENCOUNTER — Ambulatory Visit: Payer: BC Managed Care – PPO | Admitting: Internal Medicine

## 2013-12-11 ENCOUNTER — Other Ambulatory Visit: Payer: Self-pay

## 2013-12-11 MED ORDER — METOPROLOL TARTRATE 25 MG PO TABS: ORAL_TABLET | ORAL | Status: DC

## 2013-12-11 MED ORDER — ATORVASTATIN CALCIUM 40 MG PO TABS: 40.0000 mg | ORAL_TABLET | Freq: Every day | ORAL | Status: DC

## 2013-12-13 ENCOUNTER — Telehealth (HOSPITAL_COMMUNITY): Payer: Self-pay

## 2013-12-13 NOTE — Telephone Encounter (Signed)
Encounter complete. 

## 2013-12-18 ENCOUNTER — Ambulatory Visit (HOSPITAL_COMMUNITY)
Admission: RE | Admit: 2013-12-18 | Discharge: 2013-12-18 | Disposition: A | Discharge status: Home / Self Care | Payer: BC Managed Care – PPO | Source: Ambulatory Visit | Attending: Cardiovascular Disease | Admitting: Cardiovascular Disease

## 2013-12-18 DIAGNOSIS — R0609 Other forms of dyspnea: Secondary | ICD-10-CM | POA: Diagnosis not present

## 2013-12-18 DIAGNOSIS — R42 Dizziness and giddiness: Secondary | ICD-10-CM | POA: Diagnosis not present

## 2013-12-18 DIAGNOSIS — R002 Palpitations: Secondary | ICD-10-CM | POA: Diagnosis not present

## 2013-12-18 DIAGNOSIS — R079 Chest pain, unspecified: Secondary | ICD-10-CM

## 2013-12-18 NOTE — Procedures (Signed)
Exercise Treadmill Test  Test  Exercise Tolerance Test Ordering MD: Marijo File, MD    Unique Test No: 1  Treadmill:  1  Indication for ETT: palpitations  Contraindication to ETT: No   Stress Modality: exercise - treadmill  Cardiac Imaging Performed: non   Protocol: standard Bruce - maximal  Max BP:  215/91  Max MPHR (bpm):  158 85% MPR (bpm):  134  MPHR obtained (bpm):  136 % MPHR obtained:  86  Reached 85% MPHR (min:sec):  3:50 Total Exercise Time (min-sec):  4  Workload in METS:  5.8 Borg Scale: 14  Reason ETT Terminated:  SOB, Leg heaviness, HTN,     ST Segment Analysis At Rest: normal ST segments - no evidence of significant ST depression With Exercise: no evidence of significant ST depression  Other Information Arrhythmia:  Yes Angina during ETT:  absent (0) Quality of ETT:  indeterminate  ETT Interpretation:  normal - no evidence of ischemia by ST analysis  Comments: The patient had an moderate to low exercise tolerance.  There was no chest pain.  The test was stopped secondary to dyspnea.  There were PVCs during exercise and in recovery but no sustained runs.  She had anormal heart rate response but a very accelerated BP response.  There were no ischemic ST T wave changes and a normal heart rate recovery.  The Duke treadmill score was -2 indicating moderate risk.    Recommendations: No evidence of ischemia.  However, with the ectopy and accelerated BP response I will see her back to discuss these results and consider further medication management.

## 2013-12-20 ENCOUNTER — Ambulatory Visit (INDEPENDENT_AMBULATORY_CARE_PROVIDER_SITE_OTHER): Payer: BC Managed Care – PPO | Admitting: Neurology

## 2013-12-20 ENCOUNTER — Encounter: Payer: Self-pay | Admitting: Neurology

## 2013-12-20 ENCOUNTER — Ambulatory Visit: Payer: BC Managed Care – PPO | Admitting: Neurology

## 2013-12-20 VITALS — BP 126/83 | HR 69 | Ht 71.0 in | Wt 232.0 lb

## 2013-12-20 DIAGNOSIS — G459 Transient cerebral ischemic attack, unspecified: Secondary | ICD-10-CM

## 2013-12-20 NOTE — Progress Notes (Signed)
[facility] [address] [place]. [place] [address] [phone]       OFFICE FOLLOW-UP NOTE  [name] Date of Birth:  04/19/1950 Medical Record Number:  [id number]   HPI: 63 year Caucasian lady with history of left brain TIA in July 2013 from small vessel disease with vascular risk factors of hypertension and hyperlipidemia  Update 12/20/12 She returns for followup of the last visit on 11/16/11. She continues to do well from neurovascular standpoint without recurrent stroke or TIA symptoms. She is tolerating Plavix well with only minor bruising. She states her blood pressure is quite good and stays in the 130s. She had lipid profile checked 3 weeks ago and hit her cholesterol was elevated and her primary physician increased her pravastatin to 40 mg. She has no other complaints today. Update 12/20/2013 : she returns for follow-up after last visit a year ago. She is doing well without recurrent neurovascular symptoms. She is tolerating Plavix well without bleeding or bruising. She states her blood pressure tends to fluctuate and it has been high when she exercises or is under stress. Her primary care physician has recently added metoprolol but she can still see her cardiologist Dr. Percival Spanish as well. Her last lipid profile was checked 6 months ago and she is due for another check next week.she has not had a carotid Doppler done for couple of years now ROS:   14 system review of systems is positive for  Ringing in the ears, cough, flushing, heat intolerance, dizziness, insomnia, frequent waking, easy bruising and hot flashes only. PMH:  Past Medical History  Diagnosis Date  . Hypertension     x years  . Hyperlipidemia     x years  . Hashimoto's thyroiditis   . Shingles   . Heart palpitations   . TIA (transient ischemic attack)     Social History:  History   Social History  . Marital Status: Married    Spouse Name: Katie Allen    Number of Children: 1  . Years of  Education: HS   Occupational History  .      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 Smoking status: Never Smoker   . Smokeless tobacco: Never Used  . Alcohol Use: No     Comment: Social  . Drug Use: No  . Sexual Activity: Not on file   Other Topics Concern  . 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 Has a 67 year old son.   Patient lives at home with spouse.   Caffeine Use: 4 cups daily    Medications:   Current Outpatient Prescriptions on File Prior to Visit  Medication Sig Dispense Refill  . Ascorbic Acid (VITAMIN C) 500 MG tablet Take 500 mg by mouth daily.      Marland Kitchen atorvastatin (LIPITOR) 40 MG tablet Take 1 tablet (40 mg total) by mouth daily. 90 tablet 3  . b complex vitamins tablet Take 1 tablet by mouth daily.      . Cholecalciferol (PA VITAMIN D-3) 2000 UNITS CAPS Take by mouth.      . clopidogrel (PLAVIX) 75 MG tablet TAKE ONE TABLET BY MOUTH ONCE DAILY WITH BREAKFAST 90 tablet 1  . levothyroxine (SYNTHROID, LEVOTHROID) 50 MCG tablet Alternates 46mcg and 50 mcg daily 90 tablet 1  . lisinopril-hydrochlorothiazide (PRINZIDE,ZESTORETIC) 20-12.5 MG per tablet TAKE ONE TABLET BY MOUTH ONCE DAILY 90 tablet 3  . metoprolol tartrate (  LOPRESSOR) 25 MG tablet One po twice a day 180 tablet 3  . Multiple Vitamin (MULTIVITAMIN) tablet Take 1 tablet by mouth daily.      Marland Kitchen omeprazole (PRILOSEC) 20 MG capsule Take 20 mg by mouth daily. OTC     No current facility-administered medications on file prior to visit.    Allergies:   Allergies  Allergen Reactions  . Penicillins Swelling    Swollen tongue    Physical Exam General: well developed, well nourished, seated, in no evident distress Head: head normocephalic and atraumatic. Oropharynx benign Neck: supple with no carotid or supraclavicular bruits Cardiovascular: regular rate and rhythm, no murmurs Musculoskeletal: no deformity Skin:  no rash/petichiae Vascular:   Normal pulses all extremities Filed Vitals:   12/20/13 1057  BP: 126/83  Pulse: 69    Neurologic Exam Mental Status: Awake and fully alert. Oriented to place and time. Recent and remote memory intact. Attention span, concentration and fund of knowledge appropriate. Mood and affect appropriate.  Cranial Nerves: Fundoscopic exam not done   Pupils equal, briskly reactive to light. Extraocular movements full without nystagmus. Visual fields full to confrontation. Hearing intact. Facial sensation intact. Face, tongue, palate moves normally and symmetrically.  Motor: Normal bulk and tone. Normal strength in all tested extremity muscles. Sensory.: intact to touch and pinprick and vibratory sensation.  Coordination: Rapid alternating movements normal in all extremities. Finger-to-nose and heel-to-shin performed accurately bilaterally. Gait and Station: Arises from chair without difficulty. Stance is normal. Gait demonstrates normal stride length and balance . Able to heel, toe and tandem walk without difficulty.  Reflexes: 1+ and symmetric. Toes downgoing.      ASSESSMENT: 63 year Caucasian lady with history of left brain TIA in July 2013 from small vessel disease with vascular risk factors of hypertension and hyperlipidemia .She is doing well from neurovascular standpoint.    PLAN:  I had a long discussion with the patient with regards to her TIA and stroke risk factors and need for aggressive risk factor modification. Continue Plavix for stroke prevention with strict control of hypertension with blood pressure goal below 130/90 and lipids with LDL cholesterol goal below 70 mg percent. Check screening carotid ultrasound study. Since patient has been TIA  free for  2-1/2 years I recommend no scheduled follow-up appointment with me. She may return in the future only as needed.

## 2013-12-20 NOTE — Patient Instructions (Signed)
I had a long discussion with the patient with regards to her TIA and stroke risk factors and need for aggressive risk factor modification. Continue Plavix for stroke prevention with strict control of hypertension with blood pressure goal below 130/90 and lipids with LDL cholesterol goal below 70 mg percent. Check screening carotid ultrasound study. Since patient has been TIA  free for  2-1/2 years I recommend no scheduled follow-up appointment with me. She may return in the future only as needed.

## 2013-12-24 ENCOUNTER — Other Ambulatory Visit: Payer: BC Managed Care – PPO | Admitting: Nurse Practitioner

## 2014-01-04 NOTE — Progress Notes (Signed)
   Subjective:    Patient ID: Katie Allen, female    DOB: 1950/09/25, 63 y.o.   MRN: [id number]  HPI Some improvement with Lopressor 25 mg daily for palpitations and hypertension. She saw Dr. Archie Patten recently to evaluate hypertension palpitations an episode of chest tightness. He advised her to take Lopressor 25 mg twice daily. I'm in agreement with this. He  plans to do exercise tolerance test in the near future. Blood pressure today 140/80. Would like to see some improvement.    Review of Systems     Objective:   Physical Exam  Skin warm and dry. Nodes none. Neck supple without JVD thyromegaly or carotid bruits. Chest clear. Cardiac exam regular rate and rhythm. Extremities without edema      Assessment & Plan:  Hypertension  Hyperlipidemia  Hypothyroidism  Palpitations  History of TIA  Plan: Patient will increase Lopressor to 25 mg twice daily and monitor blood pressure. If not under good control, she will contact me. Needs to have fasting lipid panel in the near future. Discontinue Pravachol and start Lipitor generic. Appointment for lipid panel liver functions December 21

## 2014-01-10 ENCOUNTER — Other Ambulatory Visit: Payer: BC Managed Care – PPO

## 2014-01-22 ENCOUNTER — Ambulatory Visit: Payer: BC Managed Care – PPO | Admitting: Cardiology

## 2014-01-28 ENCOUNTER — Other Ambulatory Visit: Payer: BC Managed Care – PPO | Admitting: Internal Medicine

## 2014-01-28 DIAGNOSIS — Z13220 Encounter for screening for lipoid disorders: Secondary | ICD-10-CM

## 2014-01-28 DIAGNOSIS — Z0000 Encounter for general adult medical examination without abnormal findings: Secondary | ICD-10-CM

## 2014-01-28 LAB — HEPATIC FUNCTION PANEL
ALBUMIN: 4.2 g/dL (ref 3.5–5.2)
ALT: 24 U/L (ref 0–35)
AST: 17 U/L (ref 0–37)
Alkaline Phosphatase: 78 U/L (ref 39–117)
Bilirubin, Direct: 0.1 mg/dL (ref 0.0–0.3)
Indirect Bilirubin: 0.7 mg/dL (ref 0.2–1.2)
Total Bilirubin: 0.8 mg/dL (ref 0.2–1.2)
Total Protein: 6.4 g/dL (ref 6.0–8.3)

## 2014-01-28 LAB — LIPID PANEL
CHOLESTEROL: 132 mg/dL (ref 0–200)
HDL: 38 mg/dL — AB (ref >39)
LDL Cholesterol: 64 mg/dL (ref 0–99)
Total CHOL/HDL Ratio: 3.5 Ratio
Triglycerides: 152 mg/dL — ABNORMAL HIGH (ref <150)
VLDL: 30 mg/dL (ref 0–40)

## 2014-02-22 ENCOUNTER — Ambulatory Visit: Payer: BC Managed Care – PPO | Admitting: Cardiology

## 2014-03-20 ENCOUNTER — Ambulatory Visit (INDEPENDENT_AMBULATORY_CARE_PROVIDER_SITE_OTHER): Payer: BLUE CROSS/BLUE SHIELD | Admitting: Cardiology

## 2014-03-20 ENCOUNTER — Encounter: Payer: Self-pay | Admitting: Cardiology

## 2014-03-20 VITALS — BP 140/98 | HR 79 | Ht 71.0 in | Wt 241.0 lb

## 2014-03-20 DIAGNOSIS — I10 Essential (primary) hypertension: Secondary | ICD-10-CM

## 2014-03-20 MED ORDER — METOPROLOL TARTRATE 25 MG PO TABS: 37.5000 mg | ORAL_TABLET | Freq: Two times a day (BID) | ORAL | Status: DC

## 2014-03-20 NOTE — Progress Notes (Signed)
HPI The patient presents for followup of palpitations. She has a family history of sudden cardiac death of her brother who had an ICD.  He is her twin She did wear a monitor in September and have sinus tachycardia. There were occasional PVCs.   She has been started on a beta blocker which she's had a difficult time tolerating in the past but she is tolerating the current dose. I did send her for a treadmill test and it was stopped secondary to dyspnea. There were PVCs during exercise and recovery but there were no sustained runs. She did have a neck supple elevated blood pressure response. She brings her blood pressure diary today and her BPs have been well controlled. She's not really noticing palpitations increased or syncope. She does not exercise. She doesn't describe any chest pressure, neck or arm discomfort. She's had no weight gain or edema.   Allergies  Allergen Reactions  . Penicillins Swelling    Swollen tongue    Current Outpatient Prescriptions  Medication Sig Dispense Refill  . Ascorbic Acid (VITAMIN C) 500 MG tablet Take 500 mg by mouth daily.      Marland Kitchen atorvastatin (LIPITOR) 40 MG tablet Take 1 tablet (40 mg total) by mouth daily. 90 tablet 3  . b complex vitamins tablet Take 1 tablet by mouth daily.      . Cholecalciferol (PA VITAMIN D-3) 2000 UNITS CAPS Take by mouth.      . clopidogrel (PLAVIX) 75 MG tablet TAKE ONE TABLET BY MOUTH ONCE DAILY WITH BREAKFAST 90 tablet 1  . levothyroxine (SYNTHROID, LEVOTHROID) 50 MCG tablet Alternates 62mcg and 50 mcg daily 90 tablet 1  . lisinopril-hydrochlorothiazide (PRINZIDE,ZESTORETIC) 20-12.5 MG per tablet TAKE ONE TABLET BY MOUTH ONCE DAILY 90 tablet 3  . metoprolol tartrate (LOPRESSOR) 25 MG tablet One po twice a day 180 tablet 3  . Multiple Vitamin (MULTIVITAMIN) tablet Take 1 tablet by mouth daily.      Marland Kitchen omeprazole (PRILOSEC) 20 MG capsule Take 20 mg by mouth daily. OTC     No current facility-administered medications for this  visit.    Past Medical History  Diagnosis Date  . Hypertension     x years  . Hyperlipidemia     x years  . Hashimoto's thyroiditis   . Shingles   . Heart palpitations   . TIA (transient ischemic attack)     Past Surgical History  Procedure Laterality Date  . Cholecystectomy      ROS:  As stated in the HPI and negative for all other systems.  PHYSICAL EXAM BP 140/98 mmHg  Pulse 79  Ht 5\' 11"  (1.803 m)  Wt 241 lb (109.317 kg)  BMI 33.63 kg/m2 GENERAL:  Well appearing HEENT:  Pupils equal round and reactive, fundi not visualized, oral mucosa unremarkable NECK:  No jugular venous distention, waveform within normal limits, carotid upstroke brisk and symmetric, no bruits, no thyromegaly LUNGS:  Clear to auscultation bilaterally BACK:  No CVA tenderness CHEST:  Unremarkable HEART:  PMI not displaced or sustained,S1 and S2 within normal limits, no S3, no S4, no clicks, no rubs, no murmurs ABD:  Flat, positive bowel sounds normal in frequency in pitch, no bruits, no rebound, no guarding, no midline pulsatile mass, no hepatomegaly, no splenomegaly EXT:  2 plus pulses throughout, no edema, no cyanosis no clubbing   EKG: Normal snus rhythm, rate 79, axis within normal limits, intervals within normal limits, premature atrial contractions, and PVCs.  03/20/2014  ASSESSMENT AND PLAN  Palpitations -  She's willing to try increasing her dose of metoprolol 37.5 mg twice daily. She actually thinks the palpitations are better. She's going to get the records from her brother as to the indication for his defibrillator.  HTN -  Her blood pressure is being controlled in the context of managing her palpitations as above.  OVERWEIGHT:  The patient understands the need to lose weight with diet and exercise. We have discussed specific strategies for this.

## 2014-03-20 NOTE — Patient Instructions (Signed)
please increase your Metoprolol to 37.5 mg twice a day. Continue all other medications as listed.  Follow up in 2 months with Dr Percival Spanish.  Thank you for choosing [facility]!!

## 2014-03-22 ENCOUNTER — Telehealth: Payer: Self-pay | Admitting: Radiology

## 2014-03-22 NOTE — Telephone Encounter (Signed)
Spoke with patient 01/15/14 and she wanted me to call her back after the first of the year to schdule her Carotid doppler because she is changing insurance companies. Spoke with patient today and she wants to wait another couple of months before scheduling her Carotid doppler.

## 2014-05-02 ENCOUNTER — Ambulatory Visit
Admission: RE | Admit: 2014-05-02 | Discharge: 2014-05-02 | Disposition: A | Discharge status: Home / Self Care | Payer: BLUE CROSS/BLUE SHIELD | Source: Ambulatory Visit

## 2014-05-02 DIAGNOSIS — Z1231 Encounter for screening mammogram for malignant neoplasm of breast: Secondary | ICD-10-CM

## 2014-05-03 ENCOUNTER — Other Ambulatory Visit: Payer: Self-pay | Admitting: Internal Medicine

## 2014-05-03 DIAGNOSIS — R928 Other abnormal and inconclusive findings on diagnostic imaging of breast: Secondary | ICD-10-CM

## 2014-05-07 ENCOUNTER — Ambulatory Visit
Admission: RE | Admit: 2014-05-07 | Discharge: 2014-05-07 | Disposition: A | Discharge status: Home / Self Care | Payer: BLUE CROSS/BLUE SHIELD | Source: Ambulatory Visit | Attending: Internal Medicine | Admitting: Internal Medicine

## 2014-05-07 DIAGNOSIS — R928 Other abnormal and inconclusive findings on diagnostic imaging of breast: Secondary | ICD-10-CM

## 2014-05-16 ENCOUNTER — Other Ambulatory Visit: Payer: BLUE CROSS/BLUE SHIELD | Admitting: Internal Medicine

## 2014-05-16 DIAGNOSIS — Z130 Encounter for screening for diseases of the blood and blood-forming organs and certain disorders involving the immune mechanism: Secondary | ICD-10-CM

## 2014-05-16 DIAGNOSIS — Z13220 Encounter for screening for lipoid disorders: Secondary | ICD-10-CM

## 2014-05-16 DIAGNOSIS — Z1321 Encounter for screening for nutritional disorder: Secondary | ICD-10-CM

## 2014-05-16 DIAGNOSIS — Z0000 Encounter for general adult medical examination without abnormal findings: Secondary | ICD-10-CM

## 2014-05-16 DIAGNOSIS — Z1329 Encounter for screening for other suspected endocrine disorder: Secondary | ICD-10-CM

## 2014-05-16 LAB — COMPLETE METABOLIC PANEL WITH GFR
ALBUMIN: 4 g/dL (ref 3.5–5.2)
ALT: 22 U/L (ref 0–35)
AST: 16 U/L (ref 0–37)
Alkaline Phosphatase: 71 U/L (ref 39–117)
BUN: 16 mg/dL (ref 6–23)
CALCIUM: 9.4 mg/dL (ref 8.4–10.5)
CHLORIDE: 104 meq/L (ref 96–112)
CO2: 25 mEq/L (ref 19–32)
Creat: 0.55 mg/dL (ref 0.50–1.10)
GFR, Est African American: >89 mL/min
GLUCOSE: 87 mg/dL (ref 70–99)
POTASSIUM: 4.2 meq/L (ref 3.5–5.3)
Sodium: 140 mEq/L (ref 135–145)
Total Bilirubin: 0.8 mg/dL (ref 0.2–1.2)
Total Protein: 6.5 g/dL (ref 6.0–8.3)

## 2014-05-16 LAB — CBC WITH DIFFERENTIAL/PLATELET
Basophils Absolute: 0 10*3/uL (ref 0.0–0.1)
Basophils Relative: 0 % (ref 0–1)
EOS ABS: 0.1 10*3/uL (ref 0.0–0.7)
Eosinophils Relative: 2 % (ref 0–5)
HCT: 41.8 % (ref 36.0–46.0)
HEMOGLOBIN: 14 g/dL (ref 12.0–15.0)
LYMPHS ABS: 3.1 10*3/uL (ref 0.7–4.0)
LYMPHS PCT: 45 % (ref 12–46)
MCH: 29.2 pg (ref 26.0–34.0)
MCHC: 33.5 g/dL (ref 30.0–36.0)
MCV: 87.1 fL (ref 78.0–100.0)
MONOS PCT: 6 % (ref 3–12)
MPV: 9.9 fL (ref 8.6–12.4)
Monocytes Absolute: 0.4 10*3/uL (ref 0.1–1.0)
NEUTROS PCT: 47 % (ref 43–77)
Neutro Abs: 3.2 10*3/uL (ref 1.7–7.7)
Platelets: 258 10*3/uL (ref 150–400)
RBC: 4.8 MIL/uL (ref 3.87–5.11)
RDW: 14.4 % (ref 11.5–15.5)
WBC: 6.8 10*3/uL (ref 4.0–10.5)

## 2014-05-16 LAB — LIPID PANEL
CHOLESTEROL: 125 mg/dL (ref 0–200)
HDL: 31 mg/dL — AB (ref >=46)
LDL Cholesterol: 65 mg/dL (ref 0–99)
TRIGLYCERIDES: 147 mg/dL (ref <150)
Total CHOL/HDL Ratio: 4 Ratio
VLDL: 29 mg/dL (ref 0–40)

## 2014-05-16 LAB — TSH: TSH: 0.913 u[IU]/mL (ref 0.350–4.500)

## 2014-05-17 LAB — VITAMIN D 25 HYDROXY (VIT D DEFICIENCY, FRACTURES): Vit D, 25-Hydroxy: 38 ng/mL (ref 30–100)

## 2014-05-20 ENCOUNTER — Ambulatory Visit (INDEPENDENT_AMBULATORY_CARE_PROVIDER_SITE_OTHER): Payer: BLUE CROSS/BLUE SHIELD | Admitting: Internal Medicine

## 2014-05-20 ENCOUNTER — Other Ambulatory Visit (HOSPITAL_COMMUNITY)
Admission: RE | Admit: 2014-05-20 | Discharge: 2014-05-20 | Disposition: A | Discharge status: Home / Self Care | Payer: BLUE CROSS/BLUE SHIELD | Source: Ambulatory Visit | Attending: Internal Medicine | Admitting: Internal Medicine

## 2014-05-20 ENCOUNTER — Encounter: Payer: Self-pay | Admitting: Internal Medicine

## 2014-05-20 VITALS — BP 118/82 | HR 73 | Temp 97.9°F | Ht 71.0 in | Wt 235.0 lb

## 2014-05-20 DIAGNOSIS — L821 Other seborrheic keratosis: Secondary | ICD-10-CM

## 2014-05-20 DIAGNOSIS — Z8673 Personal history of transient ischemic attack (TIA), and cerebral infarction without residual deficits: Secondary | ICD-10-CM

## 2014-05-20 DIAGNOSIS — Z01419 Encounter for gynecological examination (general) (routine) without abnormal findings: Secondary | ICD-10-CM | POA: Insufficient documentation

## 2014-05-20 DIAGNOSIS — K219 Gastro-esophageal reflux disease without esophagitis: Secondary | ICD-10-CM

## 2014-05-20 DIAGNOSIS — I10 Essential (primary) hypertension: Secondary | ICD-10-CM | POA: Diagnosis not present

## 2014-05-20 DIAGNOSIS — E039 Hypothyroidism, unspecified: Secondary | ICD-10-CM

## 2014-05-20 DIAGNOSIS — Z8249 Family history of ischemic heart disease and other diseases of the circulatory system: Secondary | ICD-10-CM | POA: Diagnosis not present

## 2014-05-20 DIAGNOSIS — E785 Hyperlipidemia, unspecified: Secondary | ICD-10-CM | POA: Diagnosis not present

## 2014-05-20 DIAGNOSIS — R002 Palpitations: Secondary | ICD-10-CM | POA: Diagnosis not present

## 2014-05-20 DIAGNOSIS — I471 Supraventricular tachycardia: Secondary | ICD-10-CM | POA: Diagnosis not present

## 2014-05-20 DIAGNOSIS — N3941 Urge incontinence: Secondary | ICD-10-CM | POA: Diagnosis not present

## 2014-05-20 DIAGNOSIS — Z0000 Encounter for general adult medical examination without abnormal findings: Secondary | ICD-10-CM

## 2014-05-20 MED ORDER — CLOPIDOGREL BISULFATE 75 MG PO TABS: ORAL_TABLET | ORAL | Status: DC

## 2014-05-20 NOTE — Progress Notes (Signed)
Subjective:    Patient ID: Katie Allen, female    DOB: 1950-09-14, 64 y.o.   MRN: [id number]  HPI  64 year old white female in today for health maintenance exam and evaluation of medical issues. She has some concerns about a couple of lesions on either side of her face which appear to be seborrheic keratoses. She has a nevus on her right anterior thigh that has changed. Will be seeing Dr. Tonia Brooms in the near future. She has appointment next month to see Dr. Archie Patten. She is to ask him about screening for abdominal aortic aneurysm. She said her father had one and she is concerned about it. Her blood pressures under good control. She plans to have colonoscopy and endoscopy in the near future. History of GE reflux. Has seen Dr. Cristina Gong in the past. Prescription given for Zostavax vaccine to be obtained pharmacy. She has gained 5 pounds since September 2015. Complaining of some lower extremity edema. 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 Needs to diet and exercise.  History of TIA in the remote past. History of hypothyroidism, hyperlipidemia, hypertension. She is maintained on Plavix. Had left brain TIA July 2013 well on estrogen replacement. She had complained of numbness in arm and leg for 2 weeks. MRI was unremarkable except for small vessel disease.  She is on generic pravastatin. She takes omeprazole for GE reflux. Some days it's worse than others and she's thinking about doubling up to twice daily omeprazole. She needs to check with GI physician about this. Hypertension is treated with lisinopril HCTZ 20/12.5 mg daily. She has a history of occasional PVCs and bigeminy with rare PACs. She has a history of palpitations and that is why she is followed by cardiology. 24-hour Holter monitor 10/18/2013 showed sinus tachycardia with rate up to 126 bpm. She is on metoprolol.  Patient had cholecystectomy 2010  She is allergic penicillin-causes a swollen tongue  Codeine makes her  nauseated  Colonoscopy done November 2010  Social history: She is married. Completed high school. 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 She is married. Has one adult son. She lives in [place]. She does not smoke or consume alcohol. Currently working some 10 hours daily.  Family history: Father died at age 64 of an MI, stroke, with history of abdominal aortic aneurysm. Mother died at age 65 of colon cancer. Has twin brother. Twin has history of pacemaker and defibrillator with atrial fibrillation. Another brother in his early 64s in good health with hypertension. Another brother in good health in his mid 67s. One sister in good health.    Review of Systems  Constitutional: Negative.   HENT: Negative.   Eyes: Negative.   Respiratory: Negative.   Cardiovascular: Positive for leg swelling.  Gastrointestinal: Negative.   Endocrine: Negative.   Genitourinary:       Issues with urge urinary incontinence which are new.  Neurological: Negative.   Hematological: Negative.   Psychiatric/Behavioral: Negative.        Objective:   Physical Exam  Constitutional: She is oriented to person, place, and time. She appears well-developed and well-nourished. No distress.  HENT:  Head: Normocephalic and atraumatic.  Right Ear: External ear normal.  Left Ear: External ear normal.  Mouth/Throat: Oropharynx is clear and moist. No oropharyngeal exudate.  Eyes: Conjunctivae are normal. Pupils are equal, round, and reactive to light. Right eye exhibits no discharge. Left eye  exhibits no discharge. No scleral icterus.  Neck: Neck supple. No JVD present. No thyromegaly present.  Cardiovascular: Normal rate, regular rhythm, normal heart sounds and intact distal pulses.   No murmur heard. Pulmonary/Chest: Effort normal and breath sounds normal. No respiratory distress. She has no wheezes. She has no rales.   Abdominal: Soft. Bowel sounds are normal. She exhibits no distension and no mass. There is no tenderness. There is no rebound and no guarding.  Genitourinary:  Pap taken. Bimanual normal. Cystocele present.  Musculoskeletal: Normal range of motion. She exhibits no edema.  Lymphadenopathy:    She has no cervical adenopathy.  Neurological: She is alert and oriented to person, place, and time. She displays normal reflexes. No cranial nerve deficit. Coordination normal.  Skin: Skin is warm and dry. No rash noted. She is not diaphoretic.  Psychiatric: She has a normal mood and affect. Her behavior is normal. Judgment and thought content normal.  Vitals reviewed.         Assessment & Plan:  Urge urinary incontinence  Hypertension  Palpitations with history of PVCs and SVT treated with metoprolol  History of TIA 2013 now on Plavix  Hypothyroidism  History of hyperlipidemia  Seborrheic keratoses on face  Cystocele  GE reflux- on omeprazole. May try  omeprazole twice a day  Mild dependent edema-no significant edema today. Encourage frequent walking. Prolonged sitting may aggravate edema. Watch salt intake.  Plan: Patient does not want to be on estrogen vaginally for atrophy and urge urinary incontinence. Does not want to pursue medication at this time for urge urinary incontinence. Keep appointment with Dr. Orlean Patten next month and ask him about screening for abdominal aortic aneurysm. No bruit heard today in abdomen. She is to return here in 6 months for office visit lipid panel liver functions and TSH. Lab work is within normal limits. Prescription given to have Zostavax vaccine at pharmacy.

## 2014-05-20 NOTE — Patient Instructions (Signed)
Continue same medications and return in 6 months. Prescription for Zostavax vaccine given.

## 2014-05-22 ENCOUNTER — Ambulatory Visit: Payer: BLUE CROSS/BLUE SHIELD | Admitting: Cardiology

## 2014-05-22 LAB — CYTOLOGY - PAP

## 2014-06-06 ENCOUNTER — Telehealth: Payer: Self-pay | Admitting: Radiology

## 2014-06-06 NOTE — Telephone Encounter (Signed)
S/W patient and she is wanting to wait till June to have her Carotid.  She will call me.

## 2014-06-28 ENCOUNTER — Telehealth: Payer: Self-pay | Admitting: Radiology

## 2014-06-28 NOTE — Telephone Encounter (Signed)
Trying to schedule the carotid since Nov. 2015.  Called on 12/28/13, 12/1, 12/3, 12/8, 02/19/14, 03/22/14 & 06/28/14.

## 2014-07-03 ENCOUNTER — Ambulatory Visit: Payer: BLUE CROSS/BLUE SHIELD | Admitting: Cardiology

## 2014-08-28 ENCOUNTER — Encounter: Payer: Self-pay | Admitting: Cardiology

## 2014-08-28 ENCOUNTER — Ambulatory Visit (INDEPENDENT_AMBULATORY_CARE_PROVIDER_SITE_OTHER): Payer: BLUE CROSS/BLUE SHIELD | Admitting: Cardiology

## 2014-08-28 VITALS — BP 110/80 | HR 75 | Ht 71.0 in | Wt 246.0 lb

## 2014-08-28 DIAGNOSIS — E785 Hyperlipidemia, unspecified: Secondary | ICD-10-CM

## 2014-08-28 DIAGNOSIS — I10 Essential (primary) hypertension: Secondary | ICD-10-CM

## 2014-08-28 NOTE — Progress Notes (Signed)
HPI The patient presents for followup of palpitations. She has a family history of sudden cardiac death of her brother who had an ICD.  He is her twin.  She did wear a monitor in September and had sinus tachycardia. There were occasional PVCs.   She has been started on a beta blocker which we have been able to titrate. She returns for follow-up.  I did send her for a treadmill test and it was stopped secondary to dyspnea. There were PVCs during exercise and recovery but there were no sustained runs. She did have a neck supple elevated blood pressure response.   Since I last saw her she denies any further palpitations. She feels good.  The patient denies any new symptoms such as chest discomfort, neck or arm discomfort. There has been no new shortness of breath, PND or orthopnea. There has been no reported presyncope or syncope.  Allergies  Allergen Reactions  . Penicillins Swelling    Swollen tongue    Current Outpatient Prescriptions  Medication Sig Dispense Refill  . Ascorbic Acid (VITAMIN C) 500 MG tablet Take 500 mg by mouth daily.      Marland Kitchen atorvastatin (LIPITOR) 40 MG tablet Take 1 tablet (40 mg total) by mouth daily. 90 tablet 3  . b complex vitamins tablet Take 1 tablet by mouth daily.      . Cholecalciferol (PA VITAMIN D-3) 2000 UNITS CAPS Take by mouth.      . clopidogrel (PLAVIX) 75 MG tablet TAKE ONE TABLET BY MOUTH ONCE DAILY WITH BREAKFAST 90 tablet 1  . levothyroxine (SYNTHROID, LEVOTHROID) 50 MCG tablet Alternates 33mcg and 50 mcg daily 90 tablet 1  . lisinopril-hydrochlorothiazide (PRINZIDE,ZESTORETIC) 20-12.5 MG per tablet TAKE ONE TABLET BY MOUTH ONCE DAILY 90 tablet 3  . metoprolol tartrate (LOPRESSOR) 25 MG tablet Take 1.5 tablets (37.5 mg total) by mouth 2 (two) times daily. 1 and 1/2 tablets  po twice a day (Patient taking differently: Take 25 mg by mouth 2 (two) times daily. Take 1 1/2 tablets two times daily) 270 tablet 3  . Multiple Vitamin (MULTIVITAMIN) tablet Take  1 tablet by mouth daily.      Marland Kitchen omeprazole (PRILOSEC) 20 MG capsule Take 20 mg by mouth daily. OTC     No current facility-administered medications for this visit.    Past Medical History  Diagnosis Date  . Hypertension     x years  . Hyperlipidemia     x years  . Hashimoto's thyroiditis   . Shingles   . Heart palpitations   . TIA (transient ischemic attack)     Past Surgical History  Procedure Laterality Date  . Cholecystectomy      ROS:  As stated in the HPI and negative for all other systems.  PHYSICAL EXAM BP 110/80 mmHg  Pulse 75  Ht 5\' 11"  (1.803 m)  Wt 246 lb (111.585 kg)  BMI 34.33 kg/m2 GENERAL:  Well appearing HEENT:  Pupils equal round and reactive, fundi not visualized, oral mucosa unremarkable NECK:  No jugular venous distention, waveform within normal limits, carotid upstroke brisk and symmetric, no bruits, no thyromegaly LUNGS:  Clear to auscultation bilaterally BACK:  No CVA tenderness CHEST:  Unremarkable HEART:  PMI not displaced or sustained,S1 and S2 within normal limits, no S3, no S4, no clicks, no rubs, no murmurs ABD:  Flat, positive bowel sounds normal in frequency in pitch, no bruits, no rebound, no guarding, no midline pulsatile mass, no hepatomegaly, no splenomegaly EXT:  2 plus pulses throughout, trace edema, no cyanosis no clubbing   EKG: Normal snus rhythm, rate 75, axis within normal limits, intervals within normal limits.  08/28/2014   ASSESSMENT AND PLAN  Palpitations -  She's no longer having symptoms. She will continue the meds as listed.  HTN -  Her blood pressure is being controlled in the context of managing her palpitations as above.  OVERWEIGHT:  The patient understands the need to lose weight with diet and exercise. We have discussed specific strategies for this.

## 2014-08-28 NOTE — Patient Instructions (Signed)
Medication Instructions:  Your physician recommends that you continue on your current medications as directed. Please refer to the Current Medication list given to you today.  Follow-Up: Follow up in 6 months with Dr. Hochrein.  You will receive a letter in the mail 2 months before you are due.  Please call us when you receive this letter to schedule your follow up appointment.  Thank you for choosing [facility]!!       

## 2014-10-10 ENCOUNTER — Other Ambulatory Visit: Payer: Self-pay | Admitting: Internal Medicine

## 2014-10-10 ENCOUNTER — Telehealth: Payer: Self-pay | Admitting: Cardiology

## 2014-10-10 DIAGNOSIS — R928 Other abnormal and inconclusive findings on diagnostic imaging of breast: Secondary | ICD-10-CM

## 2014-10-10 DIAGNOSIS — R922 Inconclusive mammogram: Secondary | ICD-10-CM

## 2014-10-10 NOTE — Telephone Encounter (Signed)
Patient is scheduled for colonoscopy and endoscopy on 11/12/14 and Dr. Cristina Gong wanted to make sure patient has cardiac clearance.

## 2014-10-10 NOTE — Telephone Encounter (Signed)
  Patient is scheduled for colonoscopy and endoscopy on 11/12/14 and Dr. Cristina Gong wanted to make sure patient has cardiac clearance   Shelia with Dr. Cristina Gong [phone]

## 2014-10-10 NOTE — Telephone Encounter (Signed)
Yes she is fine for the procedure.

## 2014-10-11 NOTE — Telephone Encounter (Signed)
Letter from Dr. Percival Spanish for pre-procedure clearance faxed to  Freda Munro [phone])  To fax #: [phone]

## 2014-11-12 ENCOUNTER — Other Ambulatory Visit: Payer: Self-pay | Admitting: Gastroenterology

## 2014-11-19 ENCOUNTER — Other Ambulatory Visit: Payer: BLUE CROSS/BLUE SHIELD | Admitting: Internal Medicine

## 2014-11-19 DIAGNOSIS — E785 Hyperlipidemia, unspecified: Secondary | ICD-10-CM

## 2014-11-19 DIAGNOSIS — E039 Hypothyroidism, unspecified: Secondary | ICD-10-CM

## 2014-11-19 DIAGNOSIS — Z79899 Other long term (current) drug therapy: Secondary | ICD-10-CM

## 2014-11-19 LAB — HEPATIC FUNCTION PANEL
ALT: 21 U/L (ref 6–29)
AST: 15 U/L (ref 10–35)
Albumin: 4 g/dL (ref 3.6–5.1)
Alkaline Phosphatase: 79 U/L (ref 33–130)
Bilirubin, Direct: 0.1 mg/dL (ref <=0.2)
Indirect Bilirubin: 0.7 mg/dL (ref 0.2–1.2)
TOTAL PROTEIN: 6.1 g/dL (ref 6.1–8.1)
Total Bilirubin: 0.8 mg/dL (ref 0.2–1.2)

## 2014-11-19 LAB — LIPID PANEL
CHOL/HDL RATIO: 3.8 ratio (ref <=5.0)
CHOLESTEROL: 139 mg/dL (ref 125–200)
HDL: 37 mg/dL — AB (ref >=46)
LDL CALC: 62 mg/dL (ref <130)
TRIGLYCERIDES: 201 mg/dL — AB (ref <150)
VLDL: 40 mg/dL — AB (ref <30)

## 2014-11-19 LAB — TSH: TSH: 1.269 u[IU]/mL (ref 0.350–4.500)

## 2014-11-21 ENCOUNTER — Encounter: Payer: Self-pay | Admitting: Internal Medicine

## 2014-11-21 ENCOUNTER — Ambulatory Visit (INDEPENDENT_AMBULATORY_CARE_PROVIDER_SITE_OTHER): Payer: BLUE CROSS/BLUE SHIELD | Admitting: Internal Medicine

## 2014-11-21 VITALS — BP 120/82 | HR 67 | Temp 97.2°F | Ht 71.0 in | Wt 236.0 lb

## 2014-11-21 DIAGNOSIS — E063 Autoimmune thyroiditis: Secondary | ICD-10-CM

## 2014-11-21 DIAGNOSIS — E785 Hyperlipidemia, unspecified: Secondary | ICD-10-CM | POA: Diagnosis not present

## 2014-11-21 DIAGNOSIS — I10 Essential (primary) hypertension: Secondary | ICD-10-CM

## 2014-11-21 DIAGNOSIS — Z8719 Personal history of other diseases of the digestive system: Secondary | ICD-10-CM | POA: Diagnosis not present

## 2014-11-21 MED ORDER — MAGIC MOUTHWASH W/LIDOCAINE: 10.0000 mL | Freq: Four times a day (QID) | ORAL | Status: DC

## 2014-11-21 MED ORDER — LEVOTHYROXINE SODIUM 50 MCG PO TABS: ORAL_TABLET | ORAL | Status: DC

## 2014-11-21 MED ORDER — CLOPIDOGREL BISULFATE 75 MG PO TABS: ORAL_TABLET | ORAL | Status: DC

## 2014-11-21 NOTE — Patient Instructions (Addendum)
Try Magic mouthwash for mouth ulcers. Return in 6 months for physical exam. Watch diet and try to exercise. Triglycerides are elevated from last check in April. Continue same medications. It was a pleasure to see you today. To receive flu vaccine at work

## 2014-11-21 NOTE — Progress Notes (Signed)
   Subjective:    Patient ID: Katie Allen, female    DOB: 31-Dec-1950, 64 y.o.   MRN: [id number]  HPI  Six-month recheck on essential hypertension, hyperlipidemia, hypothyroidism. Has had some issues with apthous ulcers recently. Dr. Sandi Mariscal gave her a short course of prednisone which didn't help all that much. Symptoms started after having apparent viral gastroenteritis. Has had issues with ulcers on her tongue laterally.    Review of Systems     Objective:   Physical Exam  Neck is supple without JVD thyromegaly or carotid bruits. Chest: Clear to auscultation. Cardiac exam: Regular rate and rhythm normal S1 and S2. Extremities without edema. Skin warm and dry. She has apthous ulcers laterally both sides of tongue      Assessment & Plan:  Apthous ulcers-tongue. Patient says she's had success with Magic mouthwash in the past. Prescription written today.  Essential hypertension-stable  Hyperlipidemia-lipid panel reviewed. Has elevated triglycerides. Continue to watch diet. Liver functions normal. Triglycerides increased from 147 to 201.    History of TIA-Plavix refilled  Hypothyroidism-continue same dose of thyroid replacement. TSH within normal limits.  Health maintenance-to receive flu vaccine at work

## 2014-11-26 ENCOUNTER — Ambulatory Visit
Admission: RE | Admit: 2014-11-26 | Discharge: 2014-11-26 | Disposition: A | Discharge status: Home / Self Care | Payer: BLUE CROSS/BLUE SHIELD | Source: Ambulatory Visit | Attending: Internal Medicine | Admitting: Internal Medicine

## 2014-11-26 DIAGNOSIS — R928 Other abnormal and inconclusive findings on diagnostic imaging of breast: Secondary | ICD-10-CM

## 2014-12-13 ENCOUNTER — Other Ambulatory Visit: Payer: Self-pay | Admitting: Internal Medicine

## 2015-02-26 ENCOUNTER — Ambulatory Visit (INDEPENDENT_AMBULATORY_CARE_PROVIDER_SITE_OTHER): Payer: BLUE CROSS/BLUE SHIELD | Admitting: Internal Medicine

## 2015-02-26 ENCOUNTER — Encounter: Payer: Self-pay | Admitting: Internal Medicine

## 2015-02-26 VITALS — BP 118/78 | HR 77 | Temp 97.9°F | Resp 20 | Ht 71.0 in | Wt 246.0 lb

## 2015-02-26 DIAGNOSIS — L304 Erythema intertrigo: Secondary | ICD-10-CM

## 2015-02-26 MED ORDER — CLOTRIMAZOLE-BETAMETHASONE 1-0.05 % EX CREA: 1.0000 "application " | TOPICAL_CREAM | Freq: Two times a day (BID) | CUTANEOUS | Status: DC

## 2015-02-26 NOTE — Patient Instructions (Signed)
Use Lotrisone cream twice daily if no improvement in 5 days, see dermatologist

## 2015-02-26 NOTE — Progress Notes (Signed)
   Subjective:    Patient ID: Katie Allen, female    DOB: 1950-06-12, 65 y.o.   MRN: [id number]  HPI Patient is on Plavix. She took an Advil last evening. Has noticed of skin lesions that she thought might be petechiae or purpura. Was concerned about bleeding under the skin on Plavix. We ask her to come over today immediately for a visit. Says that she noticed a discolored area on her right inner leg near her knee. It could've been a contusion or bruise. This has since resolved. These lesions do not itch.   Review of Systems     Objective:   Physical Exam  Chest areas of macular erythema on her left anterior shoulder and upper arm. Has areas in her inner thighs bilaterally that are erythematous and macular but on left inner thigh has an area with some red papules. No rash on back, neck, or buttocks anterior chest. These areas are clearly not petechiae or purpura.      Assessment & Plan:  Dermatitis-etiology unclear. In treating her for possible intertrigo with Lotrisone cream  Plan: Lotrisone cream to areas twice a day and if no improvement in 5 days see dermatologist

## 2015-03-27 ENCOUNTER — Encounter: Payer: Self-pay | Admitting: Cardiology

## 2015-03-27 ENCOUNTER — Ambulatory Visit (INDEPENDENT_AMBULATORY_CARE_PROVIDER_SITE_OTHER): Payer: BLUE CROSS/BLUE SHIELD | Admitting: Internal Medicine

## 2015-03-27 ENCOUNTER — Ambulatory Visit (INDEPENDENT_AMBULATORY_CARE_PROVIDER_SITE_OTHER): Payer: BLUE CROSS/BLUE SHIELD | Admitting: Cardiology

## 2015-03-27 ENCOUNTER — Encounter: Payer: Self-pay | Admitting: Internal Medicine

## 2015-03-27 ENCOUNTER — Other Ambulatory Visit: Payer: Self-pay | Admitting: Family Medicine

## 2015-03-27 ENCOUNTER — Ambulatory Visit
Admission: RE | Admit: 2015-03-27 | Discharge: 2015-03-27 | Disposition: A | Discharge status: Home / Self Care | Payer: BLUE CROSS/BLUE SHIELD | Source: Ambulatory Visit | Attending: Family Medicine | Admitting: Family Medicine

## 2015-03-27 VITALS — BP 124/84 | HR 70 | Temp 98.0°F | Resp 20 | Ht 71.0 in | Wt 237.0 lb

## 2015-03-27 VITALS — BP 112/68 | HR 72 | Ht 71.0 in | Wt 244.1 lb

## 2015-03-27 DIAGNOSIS — Z8673 Personal history of transient ischemic attack (TIA), and cerebral infarction without residual deficits: Secondary | ICD-10-CM

## 2015-03-27 DIAGNOSIS — R0602 Shortness of breath: Secondary | ICD-10-CM | POA: Diagnosis not present

## 2015-03-27 DIAGNOSIS — E669 Obesity, unspecified: Secondary | ICD-10-CM

## 2015-03-27 DIAGNOSIS — R002 Palpitations: Secondary | ICD-10-CM

## 2015-03-27 DIAGNOSIS — K219 Gastro-esophageal reflux disease without esophagitis: Secondary | ICD-10-CM | POA: Diagnosis not present

## 2015-03-27 DIAGNOSIS — I10 Essential (primary) hypertension: Secondary | ICD-10-CM

## 2015-03-27 DIAGNOSIS — E785 Hyperlipidemia, unspecified: Secondary | ICD-10-CM | POA: Diagnosis not present

## 2015-03-27 DIAGNOSIS — E039 Hypothyroidism, unspecified: Secondary | ICD-10-CM

## 2015-03-27 NOTE — Progress Notes (Signed)
HPI The patient presents for followup of dyspnea.  I have seen her before for palpitaoitns. She has a family history of sudden cardiac death of her brother who had an ICD.   She did wear a monitor in the past and had sinus tachycardia. There were occasional PVCs.   She has been started on a beta blocker which we have been able to titrate. I did send her for a treadmill test and it was stopped secondary to dyspnea. There were PVCs during exercise and recovery but there were no sustained runs.   Since I last saw her she denies any further palpitations. However,  She was added to my schedule today because of dyspnea.  She says that this has been going on for several weeks.  She feels like she has to take a deep breath.  She feels like she cannot get enough breath.  She is dyspneic with any activity and says that this is on top of her chronic breathlessness.  She has difficulty lying flat secondary to dyspnea.  She is also feeling palpitations.  She says that this is different from her previous symptoms.  She is very tired.  The is not having new chest pain.  She has had no neck pressure.    Allergies  Allergen Reactions  . Penicillins Swelling    Swollen tongue    Current Outpatient Prescriptions  Medication Sig Dispense Refill  . Ascorbic Acid (VITAMIN C) 500 MG tablet Take 500 mg by mouth daily.      Marland Kitchen atorvastatin (LIPITOR) 40 MG tablet TAKE ONE TABLET BY MOUTH ONCE DAILY 90 tablet 0  . b complex vitamins tablet Take 1 tablet by mouth daily.      . Cholecalciferol (PA VITAMIN D-3) 2000 UNITS CAPS Take by mouth.      . clopidogrel (PLAVIX) 75 MG tablet TAKE ONE TABLET BY MOUTH ONCE DAILY WITH BREAKFAST 90 tablet 3  . clotrimazole-betamethasone (LOTRISONE) cream Apply 1 application topically 2 (two) times daily. 30 g 0  . levothyroxine (SYNTHROID, LEVOTHROID) 50 MCG tablet Alternates 71mcg and 50 mcg daily 90 tablet 1  . lisinopril-hydrochlorothiazide (PRINZIDE,ZESTORETIC) 20-12.5 MG tablet  TAKE ONE TABLET BY MOUTH ONCE DAILY 90 tablet 0  . metoprolol tartrate (LOPRESSOR) 25 MG tablet Take 37.5 mg by mouth 2 (two) times daily.    . Multiple Vitamin (MULTIVITAMIN) tablet Take 1 tablet by mouth daily.      Marland Kitchen omeprazole (PRILOSEC) 20 MG capsule Take 20 mg by mouth daily. OTC     No current facility-administered medications for this visit.    Past Medical History  Diagnosis Date  . Hypertension     x years  . Hyperlipidemia     x years  . Hashimoto's thyroiditis   . Shingles   . Heart palpitations   . TIA (transient ischemic attack)     Past Surgical History  Procedure Laterality Date  . Cholecystectomy      ROS:  As stated in the HPI and negative for all other systems.  PHYSICAL EXAM BP 112/68 mmHg  Pulse 72  Ht 5\' 11"  (1.803 m)  Wt 244 lb 1.6 oz (110.723 kg)  BMI 34.06 kg/m2 GENERAL:  Well appearing HEENT:  Pupils equal round and reactive, fundi not visualized, oral mucosa unremarkable NECK:  No jugular venous distention, waveform within normal limits, carotid upstroke brisk and symmetric, no bruits, no thyromegaly LUNGS:  Clear to auscultation bilaterally BACK:  No CVA tenderness CHEST:  Unremarkable HEART:  PMI not displaced or sustained,S1 and S2 within normal limits, no S3, no S4, no clicks, no rubs, no murmurs ABD:  Flat, positive bowel sounds normal in frequency in pitch, no bruits, no rebound, no guarding, no midline pulsatile mass, no hepatomegaly, no splenomegaly EXT:  2 plus pulses throughout, trace edema, no cyanosis no clubbing   EKG: Normal snus rhythm, rate 75, axis within normal limits, intervals within normal limits.  03/27/2015   ASSESSMENT AND PLAN  DYSPNEA:  This is concerning as a rather acute symptom.  I am going to start with a BNP and echocardiogram.  If these are normal she will need CT coronary angiography.  She has been unable to complete an treadmill test because of dyspnea in the past.  We could not achieve a target heart rate.   She will need further imaging.  CT scanning would be indicated to exclude obstructive CAD and look for other structural heart disease given the family history of sudden cardiac death.    Palpitations -  She's having new symptoms.  I need to get the records from her brother.  I am still not clear what his diagnosis was.    HTN -  Her blood pressure is being controlled in the context of managing her palpitations as above.

## 2015-03-27 NOTE — Progress Notes (Signed)
   Subjective:    Patient ID: Katie Allen, female    DOB: [birth year], 65 y.o.   MRN: [id number]  HPI 65 year old Female with history of palpitations including occasional PVCs and bigeminy with PACs treated with metoprolol in today complaining of shortness of breath with exertion. This is been going on for several days. Her employer Dr. Sandi Mariscal did a chest x-ray which was negative. Her pulse oximetry is normal. She continues to notice some occasional palpitations. Nothing that seems to be sustained she's feeling some today. Patient says at last visit she weighed 246 pounds and today weighs 237 pounds. She feels she was weighed correctly. In  April 2016, she weighed 237 pounds. She works very long hours and is overweight. She is anxious about her health. History of TIA in the remote past. History of hypothyroidism, hyperlipidemia, hypertension. She is maintained on Plavix. She had a left brain TIA July 2013 on estrogen replacement. She is on generic pravastatin. Hypertension treated with lisinopril HCTZ 20/12.5 mg daily. Palpitations treated with metoprolol.  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 She does not smoke or consume alcohol. Works at least 10 hours daily.  Family history: Father died at age 24 of an MI, stroke, with history of abdominal aortic aneurysm. Mother died at age 41 of colon cancer. She has a twin brother. 12 has history of pacemaker and defibrillator with atrial fibrillation. Another brother in his early 67s in good health with hypertension. Another brother in good health in his mid 6s. One sister in good health. She also had some lab work done by employer recently with normal thyroid functions.  EKG today shows first-degree AV block with occasional PAC with no sustained arrhythmia  Review of Systems     Objective:   Physical Exam  Skin warm and dry. Nodes none. No JVD or  carotid bruits. Chest clear to auscultation. Cardiac exam: Mostly regular rate and rhythm with occasional ectopy. No lower extremity edema.      Assessment & Plan:  PACs  Essential hypertension-treated with lisinopril HCTZ and metoprolol  History of TIA-treated with Plavix  History of palpitations-treated with metoprolol  Obesity  GE reflux-treated with PPI  Hypothyroidism with normal recent thyroid functions  Hyperlipidemia-treated with statin medication  Shortness of breath-etiology unclear. Recent negative chest x-ray. Consider drawing  BNP but fortunately Dr. Percival Spanish is able to see her today so she will go on and see him and 11:30 AM  Anxiety  Plan: To see cardiologist today for further evaluation. No labs drawn in this office today.

## 2015-03-27 NOTE — Patient Instructions (Signed)
Patient has appointment with cardiologist today at 11:30 AM for further evaluation of symptoms

## 2015-03-27 NOTE — Patient Instructions (Signed)
Your physician recommends that you return for lab work TODAY.  Your physician has requested that you have an echocardiogram. Echocardiography is a painless test that uses sound waves to create images of your heart. It provides your doctor with information about the size and shape of your heart and how well your heart's chambers and valves are working. This procedure takes approximately one hour. There are no restrictions for this procedure.  Dr Percival Spanish recommends that you schedule a follow-up appointment after the echocardiogram.

## 2015-03-28 LAB — BRAIN NATRIURETIC PEPTIDE: Brain Natriuretic Peptide: 23.2 pg/mL (ref <100)

## 2015-04-01 ENCOUNTER — Other Ambulatory Visit: Payer: Self-pay

## 2015-04-01 MED ORDER — ATORVASTATIN CALCIUM 40 MG PO TABS: 40.0000 mg | ORAL_TABLET | Freq: Every day | ORAL | Status: DC

## 2015-04-01 NOTE — Telephone Encounter (Signed)
Patient switched pharmacies

## 2015-04-10 ENCOUNTER — Other Ambulatory Visit: Payer: Self-pay

## 2015-04-10 ENCOUNTER — Ambulatory Visit (HOSPITAL_COMMUNITY): Payer: BLUE CROSS/BLUE SHIELD | Attending: Cardiology

## 2015-04-10 DIAGNOSIS — Z8249 Family history of ischemic heart disease and other diseases of the circulatory system: Secondary | ICD-10-CM | POA: Diagnosis not present

## 2015-04-10 DIAGNOSIS — I119 Hypertensive heart disease without heart failure: Secondary | ICD-10-CM | POA: Insufficient documentation

## 2015-04-10 DIAGNOSIS — E785 Hyperlipidemia, unspecified: Secondary | ICD-10-CM | POA: Diagnosis not present

## 2015-04-10 DIAGNOSIS — R0602 Shortness of breath: Secondary | ICD-10-CM | POA: Diagnosis not present

## 2015-04-10 DIAGNOSIS — R0600 Dyspnea, unspecified: Secondary | ICD-10-CM | POA: Diagnosis present

## 2015-04-17 ENCOUNTER — Telehealth: Payer: Self-pay | Admitting: Cardiology

## 2015-04-17 NOTE — Telephone Encounter (Signed)
Results of echo reviewed with pt. She will discuss further testing with Dr Percival Spanish when she sees him 3/20.

## 2015-04-17 NOTE — Telephone Encounter (Signed)
°  Follow Up   Pt is calling regarding her most recent ECHO results. Please call.

## 2015-04-21 ENCOUNTER — Ambulatory Visit: Payer: BLUE CROSS/BLUE SHIELD | Admitting: Cardiology

## 2015-04-24 ENCOUNTER — Other Ambulatory Visit: Payer: Self-pay | Admitting: Cardiology

## 2015-04-24 MED ORDER — METOPROLOL TARTRATE 25 MG PO TABS: 37.5000 mg | ORAL_TABLET | Freq: Two times a day (BID) | ORAL | Status: DC

## 2015-04-24 NOTE — Telephone Encounter (Signed)
Rx(s) sent to pharmacy electronically.  

## 2015-04-24 NOTE — Telephone Encounter (Signed)
°*  STAT* If patient is at the pharmacy, call can be transferred to refill team.   1. Which medications need to be refilled? (please list name of each medication and dose if known) Metoprolol 25 mg-new prescription-changing pharmacy  2. Which pharmacy/location (including street and city if local pharmacy) is medication to be sent to?[phone]  3. Do they need a 30 day or 90 day supply? 90 and refills

## 2015-04-27 NOTE — Progress Notes (Signed)
HPI The patient presents for followup of dyspnea.  I have seen her before for palpitaoitns. She has a family history of sudden cardiac death of her brother who had an ICD.   She did wear a monitor in the past and had sinus tachycardia. There were occasional PVCs.   She has been started on a beta blocker which we have been able to titrate. I did send her for a treadmill test and it was stopped secondary to dyspnea. There were PVCs during exercise and recovery but there were no sustained runs.   This was an adequate test and she had no ischemia.  She continued to have dyspnea and an echo was done.  It demonstrated a normal EF and no significant abnormalities.    Allergies  Allergen Reactions  . Penicillins Swelling    Swollen tongue    Current Outpatient Prescriptions  Medication Sig Dispense Refill  . Ascorbic Acid (VITAMIN C) 500 MG tablet Take 500 mg by mouth daily.      Marland Kitchen atorvastatin (LIPITOR) 40 MG tablet Take 1 tablet (40 mg total) by mouth daily. 90 tablet 0  . b complex vitamins tablet Take 1 tablet by mouth daily.      . Cholecalciferol (PA VITAMIN D-3) 2000 UNITS CAPS Take by mouth.      . clopidogrel (PLAVIX) 75 MG tablet TAKE ONE TABLET BY MOUTH ONCE DAILY WITH BREAKFAST 90 tablet 3  . levothyroxine (SYNTHROID, LEVOTHROID) 50 MCG tablet Alternates 86mcg and 50 mcg daily 90 tablet 1  . lisinopril-hydrochlorothiazide (PRINZIDE,ZESTORETIC) 20-12.5 MG tablet TAKE ONE TABLET BY MOUTH ONCE DAILY 90 tablet 0  . metoprolol tartrate (LOPRESSOR) 25 MG tablet Take 1.5 tablets (37.5 mg total) by mouth 2 (two) times daily. 270 tablet 3  . Multiple Vitamin (MULTIVITAMIN) tablet Take 1 tablet by mouth daily.      Marland Kitchen omeprazole (PRILOSEC) 20 MG capsule Take 20 mg by mouth daily. OTC     No current facility-administered medications for this visit.    Past Medical History  Diagnosis Date  . Hypertension     x years  . Hyperlipidemia     x years  . Hashimoto's thyroiditis   . Shingles     . Heart palpitations   . TIA (transient ischemic attack)     Past Surgical History  Procedure Laterality Date  . Cholecystectomy      ROS:  As stated in the HPI and negative for all other systems.  PHYSICAL EXAM BP 122/78 mmHg  Pulse 70  Ht 5\' 11"  (1.803 m)  Wt 247 lb (112.038 kg)  BMI 34.46 kg/m2 GENERAL:  Well appearing HEENT:  Pupils equal round and reactive, fundi not visualized, oral mucosa unremarkable NECK:  No jugular venous distention, waveform within normal limits, carotid upstroke brisk and symmetric, no bruits, no thyromegaly LUNGS:  Clear to auscultation bilaterally BACK:  No CVA tenderness CHEST:  Unremarkable HEART:  PMI not displaced or sustained,S1 and S2 within normal limits, no S3, no S4, no clicks, no rubs, no murmurs ABD:  Flat, positive bowel sounds normal in frequency in pitch, no bruits, no rebound, no guarding, no midline pulsatile mass, no hepatomegaly, no splenomegaly EXT:  2 plus pulses throughout, trace edema, no cyanosis no clubbing   ASSESSMENT AND PLAN  DYSPNEA:  We had a long discussion about this. I don't see any structural abnormalities or  high risk findings. She does not want coronary CT angiography as she thinks her dyspnea is related to  deconditioning. She's not having any of the resting shortness of breath that she was having.  She agrees that she will go on a weight loss and exercise program and if her breathing gets worse rather than better we would reevaluate.  Palpitations -  She's having new symptoms.  No change in therapy is indicated. Of note we are still working on getting records from her brother who is still alive following his sudden death.  HTN -  Her blood pressure is being controlled in the context of managing her palpitations as above.  OVERWEIGHT:  The blood pressure is at target. No change in medications is indicated. We will continue with therapeutic lifestyle changes (TLC).

## 2015-04-28 ENCOUNTER — Encounter: Payer: Self-pay | Admitting: Cardiology

## 2015-04-28 ENCOUNTER — Ambulatory Visit (INDEPENDENT_AMBULATORY_CARE_PROVIDER_SITE_OTHER): Payer: BLUE CROSS/BLUE SHIELD | Admitting: Cardiology

## 2015-04-28 VITALS — BP 122/78 | HR 70 | Ht 71.0 in | Wt 247.0 lb

## 2015-04-28 DIAGNOSIS — R0600 Dyspnea, unspecified: Secondary | ICD-10-CM

## 2015-04-28 NOTE — Patient Instructions (Signed)
Your physician wants you to follow-up in: 6 Months You will receive a reminder letter in the mail two months in advance. If you don't receive a letter, please call our office to schedule the follow-up appointment.  

## 2015-06-14 ENCOUNTER — Other Ambulatory Visit: Payer: Self-pay | Admitting: Internal Medicine

## 2015-06-16 ENCOUNTER — Ambulatory Visit (INDEPENDENT_AMBULATORY_CARE_PROVIDER_SITE_OTHER): Payer: BLUE CROSS/BLUE SHIELD | Admitting: Internal Medicine

## 2015-06-16 ENCOUNTER — Encounter: Payer: Self-pay | Admitting: Internal Medicine

## 2015-06-16 VITALS — BP 128/70 | HR 74 | Temp 97.7°F | Resp 18 | Ht 71.0 in | Wt 239.0 lb

## 2015-06-16 DIAGNOSIS — S61210A Laceration without foreign body of right index finger without damage to nail, initial encounter: Secondary | ICD-10-CM

## 2015-06-16 MED ORDER — CEFADROXIL 500 MG PO CAPS: 500.0000 mg | ORAL_CAPSULE | Freq: Two times a day (BID) | ORAL | Status: DC

## 2015-06-16 MED ORDER — MUPIROCIN 2 % EX OINT: 1.0000 "application " | TOPICAL_OINTMENT | Freq: Two times a day (BID) | CUTANEOUS | Status: DC

## 2015-06-16 NOTE — Progress Notes (Signed)
   Subjective:    Patient ID: Katie Allen, female    DOB: 1950/03/02, 65 y.o.   MRN: [id number]  HPI Patient lacerated her right index finger with a food processor around 5 PM  on May 7th  while making slaw. Says it bled a lot. Husband was able to stop bleeding with a compression bandage.    Review of Systems     Objective:   Physical Exam  Semicircular laceration tip of right index finger. Good range of motion in right finger. No evidence of secondary infection at this point.      Assessment & Plan:  Laceration right index finger  Plan: Dress with Bactroban and Tubegauz. Patient is to check on tetanus immunization. Thinks it's been within the past few years. Duricef 500 mg twice daily for 5 days. Apply Bactroban once daily until healed.

## 2015-06-16 NOTE — Patient Instructions (Addendum)
Take Duricef 500 mg twice daily for 5 days. Keep laceration clean and dry. Apply Bactroban ointment once daily until healed. Please call with date of your last tetanus immunization.

## 2015-06-25 ENCOUNTER — Other Ambulatory Visit: Payer: Self-pay

## 2015-06-25 DIAGNOSIS — E063 Autoimmune thyroiditis: Secondary | ICD-10-CM

## 2015-06-25 MED ORDER — LEVOTHYROXINE SODIUM 50 MCG PO TABS
ORAL_TABLET | ORAL | Status: DC
Start: 2015-06-25 — End: 2015-12-12

## 2015-07-09 ENCOUNTER — Other Ambulatory Visit: Payer: Self-pay | Admitting: Internal Medicine

## 2015-07-09 ENCOUNTER — Encounter: Payer: Self-pay | Admitting: Internal Medicine

## 2015-07-09 ENCOUNTER — Telehealth: Payer: Self-pay

## 2015-07-09 ENCOUNTER — Ambulatory Visit (INDEPENDENT_AMBULATORY_CARE_PROVIDER_SITE_OTHER): Payer: BLUE CROSS/BLUE SHIELD | Admitting: Internal Medicine

## 2015-07-09 VITALS — BP 132/80 | HR 83 | Temp 98.4°F | Resp 20 | Wt 235.0 lb

## 2015-07-09 DIAGNOSIS — J069 Acute upper respiratory infection, unspecified: Secondary | ICD-10-CM

## 2015-07-09 MED ORDER — DOXYCYCLINE HYCLATE 100 MG PO TABS: 100.0000 mg | ORAL_TABLET | Freq: Two times a day (BID) | ORAL | Status: DC

## 2015-07-09 MED ORDER — METHYLPREDNISOLONE ACETATE 80 MG/ML IJ SUSP
80.0000 mg | Freq: Once | INTRAMUSCULAR | Status: AC
  Administered 2015-07-09: 80 mg via INTRAMUSCULAR

## 2015-07-09 MED ORDER — BENZONATATE 100 MG PO CAPS: 200.0000 mg | ORAL_CAPSULE | Freq: Three times a day (TID) | ORAL | Status: DC | PRN

## 2015-07-09 NOTE — Progress Notes (Signed)
   Subjective:    Patient ID: Katie Allen, female    DOB: 30-Apr-1950, 65 y.o.   MRN: [id number]  HPI 65 year old Female with acute URI symptoms. Onset of symptoms Sunday, May 28. Had been out of town for the holiday weekend. Is expecting company from [place] later today. Has cough and congestion. Has had fever and night. No shaking chills. Has sinus drainage, decreased appetite, malaise and fatigue. Complains of ear pain.  Also brings up a new problem today. Has noticed a 3 cm mass above her right clavicle. Concerned because there is a family history of lymphoma she says. She did have a chest x-ray in February which was normal. However she says this mass is grown in size since then and is now tender. I do not know if it's a supraclavicular lymph node or a lipoma. We will going to see if it is ranks in size with treatment with antibiotics. She'll have another chest x-ray.    Review of Systems see above     Objective:   Physical Exam Skin warm and dry. Nodes none. 3 cm soft but tender mass right supraclavicular area. Pharynx slightly injected. Left TM is full right TM is retracted but not red. Neck is supple. Chest clear to auscultation.       Assessment & Plan:  Acute URI  Bilateral otitis media  Right supraclavicular mass-lipoma versus lymph node  Plan: Doxycycline 100 mg twice daily for 10 days. Tessalon Perles 200 mg 3 times daily as needed for cough. Depo-Medrol 80 mg IM for congestion. Will have chest x-ray to look for abnormal lymphadenopathy. If mass and right clavicle persist and remains tender, consider surgical consultation.

## 2015-07-09 NOTE — Telephone Encounter (Signed)
Patient is on her way.

## 2015-07-09 NOTE — Patient Instructions (Addendum)
Doxycycline 100 mg twice a day for 10 days. Tessalon perles for cough. Depomedrol 80 mg IM. Have chest x-ray.

## 2015-07-09 NOTE — Telephone Encounter (Signed)
Patient contacted office stating that her head is hurting, she is congested, coughing, she has had a low grade fever around 100.0 all last night and yesterday. She states that she would like to know if you will call something in for her. She is at work today.

## 2015-07-09 NOTE — Telephone Encounter (Signed)
She needs to be seen today

## 2015-08-11 ENCOUNTER — Other Ambulatory Visit: Payer: BLUE CROSS/BLUE SHIELD | Admitting: Internal Medicine

## 2015-08-14 ENCOUNTER — Other Ambulatory Visit: Payer: Self-pay | Admitting: Internal Medicine

## 2015-08-14 ENCOUNTER — Encounter: Payer: BLUE CROSS/BLUE SHIELD | Admitting: Internal Medicine

## 2015-08-14 DIAGNOSIS — N6489 Other specified disorders of breast: Secondary | ICD-10-CM

## 2015-08-21 ENCOUNTER — Ambulatory Visit
Admission: RE | Admit: 2015-08-21 | Discharge: 2015-08-21 | Disposition: A | Discharge status: Home / Self Care | Payer: BLUE CROSS/BLUE SHIELD | Source: Ambulatory Visit | Attending: Internal Medicine | Admitting: Internal Medicine

## 2015-08-21 DIAGNOSIS — N6489 Other specified disorders of breast: Secondary | ICD-10-CM

## 2015-10-04 ENCOUNTER — Other Ambulatory Visit: Payer: Self-pay | Admitting: Internal Medicine

## 2015-10-07 ENCOUNTER — Other Ambulatory Visit: Payer: BLUE CROSS/BLUE SHIELD | Admitting: Internal Medicine

## 2015-10-07 DIAGNOSIS — E669 Obesity, unspecified: Secondary | ICD-10-CM

## 2015-10-07 DIAGNOSIS — E063 Autoimmune thyroiditis: Secondary | ICD-10-CM

## 2015-10-07 DIAGNOSIS — I10 Essential (primary) hypertension: Secondary | ICD-10-CM

## 2015-10-07 DIAGNOSIS — E785 Hyperlipidemia, unspecified: Secondary | ICD-10-CM

## 2015-10-07 DIAGNOSIS — G459 Transient cerebral ischemic attack, unspecified: Secondary | ICD-10-CM

## 2015-10-07 DIAGNOSIS — E039 Hypothyroidism, unspecified: Secondary | ICD-10-CM

## 2015-10-07 LAB — COMPLETE METABOLIC PANEL WITH GFR
ALBUMIN: 4.1 g/dL (ref 3.6–5.1)
ALK PHOS: 78 U/L (ref 33–130)
ALT: 20 U/L (ref 6–29)
AST: 15 U/L (ref 10–35)
BILIRUBIN TOTAL: 0.6 mg/dL (ref 0.2–1.2)
BUN: 13 mg/dL (ref 7–25)
CALCIUM: 9.6 mg/dL (ref 8.6–10.4)
CO2: 29 mmol/L (ref 20–31)
Chloride: 105 mmol/L (ref 98–110)
Creat: 0.67 mg/dL (ref 0.50–0.99)
GFR, Est African American: >89 mL/min (ref >=60)
GFR, Est Non African American: >89 mL/min (ref >=60)
GLUCOSE: 92 mg/dL (ref 65–99)
Potassium: 3.9 mmol/L (ref 3.5–5.3)
SODIUM: 144 mmol/L (ref 135–146)
TOTAL PROTEIN: 6.3 g/dL (ref 6.1–8.1)

## 2015-10-07 LAB — CBC WITH DIFFERENTIAL/PLATELET
BASOS ABS: 0 {cells}/uL (ref 0–200)
Basophils Relative: 0 %
EOS PCT: 2 %
Eosinophils Absolute: 124 cells/uL (ref 15–500)
HEMATOCRIT: 40.8 % (ref 35.0–45.0)
HEMOGLOBIN: 13.6 g/dL (ref 11.7–15.5)
LYMPHS ABS: 2480 {cells}/uL (ref 850–3900)
Lymphocytes Relative: 40 %
MCH: 29.3 pg (ref 27.0–33.0)
MCHC: 33.3 g/dL (ref 32.0–36.0)
MCV: 87.9 fL (ref 80.0–100.0)
MONO ABS: 310 {cells}/uL (ref 200–950)
MPV: 10.2 fL (ref 7.5–12.5)
Monocytes Relative: 5 %
NEUTROS ABS: 3286 {cells}/uL (ref 1500–7800)
Neutrophils Relative %: 53 %
Platelets: 249 10*3/uL (ref 140–400)
RBC: 4.64 MIL/uL (ref 3.80–5.10)
RDW: 14.4 % (ref 11.0–15.0)
WBC: 6.2 10*3/uL (ref 3.8–10.8)

## 2015-10-07 LAB — LIPID PANEL
Cholesterol: 126 mg/dL (ref 125–200)
HDL: 40 mg/dL — ABNORMAL LOW (ref >=46)
LDL Cholesterol: 57 mg/dL (ref <130)
Total CHOL/HDL Ratio: 3.2 Ratio (ref <=5.0)
Triglycerides: 146 mg/dL (ref <150)
VLDL: 29 mg/dL (ref <30)

## 2015-10-07 LAB — TSH: TSH: 0.96 m[IU]/L

## 2015-10-08 LAB — VITAMIN D 25 HYDROXY (VIT D DEFICIENCY, FRACTURES): VIT D 25 HYDROXY: 34 ng/mL (ref 30–100)

## 2015-10-09 ENCOUNTER — Encounter: Payer: Self-pay | Admitting: Internal Medicine

## 2015-10-09 ENCOUNTER — Ambulatory Visit (INDEPENDENT_AMBULATORY_CARE_PROVIDER_SITE_OTHER): Payer: BLUE CROSS/BLUE SHIELD | Admitting: Internal Medicine

## 2015-10-09 VITALS — BP 136/88 | HR 74 | Temp 97.8°F | Ht 71.0 in | Wt 239.0 lb

## 2015-10-09 DIAGNOSIS — N3941 Urge incontinence: Secondary | ICD-10-CM | POA: Diagnosis not present

## 2015-10-09 DIAGNOSIS — I471 Supraventricular tachycardia: Secondary | ICD-10-CM | POA: Diagnosis not present

## 2015-10-09 DIAGNOSIS — Z8249 Family history of ischemic heart disease and other diseases of the circulatory system: Secondary | ICD-10-CM | POA: Diagnosis not present

## 2015-10-09 DIAGNOSIS — I10 Essential (primary) hypertension: Secondary | ICD-10-CM | POA: Diagnosis not present

## 2015-10-09 DIAGNOSIS — E669 Obesity, unspecified: Secondary | ICD-10-CM | POA: Diagnosis not present

## 2015-10-09 DIAGNOSIS — E039 Hypothyroidism, unspecified: Secondary | ICD-10-CM | POA: Diagnosis not present

## 2015-10-09 DIAGNOSIS — Z0000 Encounter for general adult medical examination without abnormal findings: Secondary | ICD-10-CM | POA: Diagnosis not present

## 2015-10-09 DIAGNOSIS — Z8673 Personal history of transient ischemic attack (TIA), and cerebral infarction without residual deficits: Secondary | ICD-10-CM | POA: Diagnosis not present

## 2015-10-09 DIAGNOSIS — E785 Hyperlipidemia, unspecified: Secondary | ICD-10-CM

## 2015-10-09 DIAGNOSIS — K219 Gastro-esophageal reflux disease without esophagitis: Secondary | ICD-10-CM

## 2015-10-09 LAB — POCT URINALYSIS DIPSTICK
Bilirubin, UA: NEGATIVE
Blood, UA: NEGATIVE
GLUCOSE UA: NEGATIVE
KETONES UA: NEGATIVE
Leukocytes, UA: NEGATIVE
Nitrite, UA: NEGATIVE
Protein, UA: NEGATIVE
SPEC GRAV UA: 1.015
UROBILINOGEN UA: 0.2
pH, UA: 5

## 2015-10-29 ENCOUNTER — Ambulatory Visit
Admission: RE | Admit: 2015-10-29 | Discharge: 2015-10-29 | Disposition: A | Discharge status: Home / Self Care | Payer: BLUE CROSS/BLUE SHIELD | Source: Ambulatory Visit | Attending: Family Medicine | Admitting: Family Medicine

## 2015-10-29 ENCOUNTER — Other Ambulatory Visit: Payer: Self-pay | Admitting: Family Medicine

## 2015-10-29 DIAGNOSIS — R05 Cough: Secondary | ICD-10-CM

## 2015-10-29 DIAGNOSIS — R059 Cough, unspecified: Secondary | ICD-10-CM

## 2015-11-02 NOTE — Patient Instructions (Signed)
It was a pleasure to see you today. Please continue same medications. Work on diet and exercise. Return in 6 months.

## 2015-11-02 NOTE — Progress Notes (Signed)
Subjective:    Patient ID: Katie Allen, female    DOB: 1950-12-29, 65 y.o.   MRN: [id number]  HPI 65 year old Female in today for health maintenance exam and evaluation of medical issues. History of GE reflux. History of hypothyroidism, hyperlipidemia, hypertension. She is maintained on Plavix. Had TIA July 2013 on estrogen replacement. She had complained of numbness in arm and leg for 2 weeks. MRI was unremarkable except for small vessel disease. She is on generic pravastatin for hyperlipidemia. She takes omeprazole for GE reflux. Has seen Dr. Cristina Gong in the past. History of dependent edema. Doesn't get to exercise much. Works long hours. History of PVCs and bigeminy. History of palpitations and that is why she is followed by cardiologist. 0.4 hour Holter monitor September 2015 showed sinus tachycardia with rate up to 126 bpm. She is on metoprolol.  She is allergic penicillin causes a swollen tongue  Codeine makes her nauseated  Colonoscopy done November 2010  Patient had cholecystectomy in 2010  Social history: She is married. Completed high school. 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 She is married. Has one adult son and lives in [place]. Does not smoke or consume alcohol.  Family history: Father died at age 42 of MI, stroke, with history of abdominal aortic aneurysm. Mother died at age 76 of colon cancer. She has a twin brother. 51 brother has history of pacemaker, defibrillator with atrial fibrillation. Another brother in his early 57s is in good health except for hypertension. Another brother is in good health in his mid 46s. One sister in good health.    Review of Systems no significant new complaints. History of urge urinary incontinence     Objective:   Physical Exam  Constitutional: She is oriented to person, place, and time. She appears well-developed and  well-nourished. No distress.  HENT:  Head: Normocephalic and atraumatic.  Right Ear: External ear normal.  Left Ear: External ear normal.  Mouth/Throat: Oropharynx is clear and moist. No oropharyngeal exudate.  Eyes: Conjunctivae and EOM are normal. Pupils are equal, round, and reactive to light. Right eye exhibits no discharge. Left eye exhibits no discharge. No scleral icterus.  Neck: Neck supple. No JVD present. No thyromegaly present.  Cardiovascular: Normal rate, normal heart sounds and intact distal pulses.   No murmur heard. Pulmonary/Chest: Effort normal. No respiratory distress. She has no wheezes. She has no rales.  Breast normal female  Abdominal: Soft. Bowel sounds are normal. She exhibits no distension and no mass. There is no tenderness. There is no rebound and no guarding.  Genitourinary:  Genitourinary Comments: Pap done 2016. Bimanual normal today.  Musculoskeletal: She exhibits no edema.  Lymphadenopathy:    She has no cervical adenopathy.  Neurological: She is alert and oriented to person, place, and time. She has normal reflexes.  Skin: Skin is warm and dry. No rash noted. She is not diaphoretic.  Psychiatric: She has a normal mood and affect. Her behavior is normal. Judgment and thought content normal.  Vitals reviewed.         Assessment & Plan:  Essential hypertension-stable  Hyperlipidemia-has low HDL cholesterol of 40  GE reflux-treated with PPI  History of cardiac dysrhythmia with PVCs ,bigeminy and SVT treated with metoprolol. Followed by cardiologist.  History of TIA 2013-now on Plavix  Hypothyroidism  Cystocele  Family history of abdominal aortic aneurysm in her father  Anxiety  regarding her health  Plan: Continue same medications and return in 6 months                    Urge urinary incontinence

## 2015-11-13 ENCOUNTER — Other Ambulatory Visit: Payer: Self-pay | Admitting: Internal Medicine

## 2015-11-13 NOTE — Telephone Encounter (Signed)
Verbal order by Dr. Renold Genta to refill Plavix 75mg  tablet.  Take 1 tablety by mouth every day with breakfast.  Disp. #90, 3 refills.  Left voicemail for pharmacy @ [facility] @ [phone].

## 2015-12-12 ENCOUNTER — Other Ambulatory Visit: Payer: Self-pay | Admitting: *Deleted

## 2015-12-12 DIAGNOSIS — E063 Autoimmune thyroiditis: Secondary | ICD-10-CM

## 2015-12-12 MED ORDER — LEVOTHYROXINE SODIUM 50 MCG PO TABS
ORAL_TABLET | ORAL | 1 refills | Status: DC
Start: 2015-12-12 — End: 2016-11-25

## 2015-12-13 ENCOUNTER — Other Ambulatory Visit: Payer: Self-pay | Admitting: Internal Medicine

## 2016-03-04 ENCOUNTER — Other Ambulatory Visit: Payer: Self-pay | Admitting: Orthopedic Surgery

## 2016-03-04 ENCOUNTER — Other Ambulatory Visit (INDEPENDENT_AMBULATORY_CARE_PROVIDER_SITE_OTHER): Payer: BLUE CROSS/BLUE SHIELD

## 2016-03-04 ENCOUNTER — Ambulatory Visit (INDEPENDENT_AMBULATORY_CARE_PROVIDER_SITE_OTHER): Payer: BLUE CROSS/BLUE SHIELD

## 2016-03-04 DIAGNOSIS — M25561 Pain in right knee: Secondary | ICD-10-CM

## 2016-03-04 DIAGNOSIS — R52 Pain, unspecified: Secondary | ICD-10-CM

## 2016-03-16 ENCOUNTER — Telehealth: Payer: Self-pay

## 2016-03-16 MED ORDER — MAGIC MOUTHWASH: ORAL | 0 refills | Status: DC

## 2016-03-16 NOTE — Telephone Encounter (Signed)
Called in to [facility] [address].

## 2016-03-16 NOTE — Telephone Encounter (Signed)
Pt is requesting a prescription for magic mouth wash, she said she keeps it on hand for mouth ulcers. He said it's been a year since he had it refill.

## 2016-03-16 NOTE — Telephone Encounter (Signed)
Call in [place] without Benadryl (16 oz) with directions: 2 tsps po 4 times a day swish but do not swallow.

## 2016-04-23 ENCOUNTER — Other Ambulatory Visit: Payer: Self-pay | Admitting: Cardiology

## 2016-04-23 NOTE — Telephone Encounter (Signed)
Rx(s) sent to pharmacy electronically.  

## 2016-06-02 ENCOUNTER — Other Ambulatory Visit: Payer: Self-pay | Admitting: *Deleted

## 2016-06-02 MED ORDER — METOPROLOL TARTRATE 25 MG PO TABS: 37.5000 mg | ORAL_TABLET | Freq: Two times a day (BID) | ORAL | 0 refills | Status: DC

## 2016-06-07 ENCOUNTER — Other Ambulatory Visit: Payer: BLUE CROSS/BLUE SHIELD | Admitting: Internal Medicine

## 2016-06-07 DIAGNOSIS — Z79899 Other long term (current) drug therapy: Secondary | ICD-10-CM

## 2016-06-07 DIAGNOSIS — E039 Hypothyroidism, unspecified: Secondary | ICD-10-CM

## 2016-06-07 DIAGNOSIS — E785 Hyperlipidemia, unspecified: Secondary | ICD-10-CM

## 2016-06-07 LAB — LIPID PANEL
Cholesterol: 135 mg/dL (ref <200)
HDL: 40 mg/dL — AB (ref >50)
LDL CALC: 60 mg/dL (ref <100)
Total CHOL/HDL Ratio: 3.4 Ratio (ref <5.0)
Triglycerides: 175 mg/dL — ABNORMAL HIGH (ref <150)
VLDL: 35 mg/dL — ABNORMAL HIGH (ref <30)

## 2016-06-07 LAB — HEPATIC FUNCTION PANEL
ALBUMIN: 4.1 g/dL (ref 3.6–5.1)
ALK PHOS: 73 U/L (ref 33–130)
ALT: 21 U/L (ref 6–29)
AST: 16 U/L (ref 10–35)
BILIRUBIN DIRECT: 0.1 mg/dL (ref <=0.2)
BILIRUBIN TOTAL: 0.6 mg/dL (ref 0.2–1.2)
Indirect Bilirubin: 0.5 mg/dL (ref 0.2–1.2)
Total Protein: 6.5 g/dL (ref 6.1–8.1)

## 2016-06-07 LAB — TSH: TSH: 0.88 mIU/L

## 2016-06-10 ENCOUNTER — Other Ambulatory Visit: Payer: Self-pay | Admitting: Internal Medicine

## 2016-06-10 ENCOUNTER — Encounter: Payer: Self-pay | Admitting: Internal Medicine

## 2016-06-10 ENCOUNTER — Ambulatory Visit (INDEPENDENT_AMBULATORY_CARE_PROVIDER_SITE_OTHER): Payer: BLUE CROSS/BLUE SHIELD | Admitting: Internal Medicine

## 2016-06-10 VITALS — BP 134/78 | HR 67 | Temp 97.8°F | Ht 71.0 in | Wt 234.0 lb

## 2016-06-10 DIAGNOSIS — I10 Essential (primary) hypertension: Secondary | ICD-10-CM | POA: Diagnosis not present

## 2016-06-10 DIAGNOSIS — E784 Other hyperlipidemia: Secondary | ICD-10-CM | POA: Diagnosis not present

## 2016-06-10 DIAGNOSIS — E7849 Other hyperlipidemia: Secondary | ICD-10-CM

## 2016-06-10 DIAGNOSIS — Z8673 Personal history of transient ischemic attack (TIA), and cerebral infarction without residual deficits: Secondary | ICD-10-CM

## 2016-06-10 MED ORDER — LISINOPRIL-HYDROCHLOROTHIAZIDE 20-25 MG PO TABS
1.0000 | ORAL_TABLET | Freq: Every day | ORAL | 1 refills | Status: DC
Start: 2016-06-10 — End: 2016-08-05

## 2016-06-10 NOTE — Patient Instructions (Signed)
Increase Prinzide to 20/25 daily and follow-up in 3 weeks. Work on diet exercise and weight loss.

## 2016-06-10 NOTE — Progress Notes (Signed)
   Subjective:    Patient ID: Katie Allen, female    DOB: May 30, 1950, 66 y.o.   MRN: [id number]  HPI 66 year old Female here today to follow-up on hyperlipidemia and hypothyroidism. Her blood pressure is elevated.  slightly.Would prefer that it be in the  120/80range  for the most part. She's cut back on her work schedule. She is cooking more and eating more. Has had some lower extremity edema. Not getting much exercise still. Would like to lose weight. Given name of obesity physician she may want contact.  Triglycerides have elevated over the winter months to 175 And previously were 146. Total cholesterol was 135 and LDL cholesterol 60. Liver panel is normal. TSH is normal at 0.88.   Review of Systems see above      Objective:   Physical Exam Skin warm and dry. Nodes none. Neck supple without JVD thyromegaly or carotid bruits. Chest clear to auscultation. Cardiac exam regular rate and rhythm normal S1 and S2. No pitting edema        Assessment & Plan:   Elevated BP.  History of TIA  Hypothyroidism  Obesity   Increase Prinizide to 20/25 and follow-up in 3 weeks. Will need b- met at that time. Continue diet exercise and weight loss efforts.  Continue lipid-lowering medication. She has a history of TIA and is on Plavix 75 mg daily. Currently taking Lopressor 37.5 mg twice daily and lisinopril HCTZ 20/12.5 daily. This was changed to 20/25 daily. Continue same dose of thyroid replacement

## 2016-06-29 ENCOUNTER — Encounter: Payer: Self-pay | Admitting: Internal Medicine

## 2016-06-29 ENCOUNTER — Ambulatory Visit (INDEPENDENT_AMBULATORY_CARE_PROVIDER_SITE_OTHER): Payer: BLUE CROSS/BLUE SHIELD | Admitting: Internal Medicine

## 2016-06-29 VITALS — BP 130/86 | HR 78 | Temp 98.5°F | Ht 71.0 in | Wt 234.0 lb

## 2016-06-29 DIAGNOSIS — E669 Obesity, unspecified: Secondary | ICD-10-CM | POA: Diagnosis not present

## 2016-06-29 DIAGNOSIS — I10 Essential (primary) hypertension: Secondary | ICD-10-CM | POA: Diagnosis not present

## 2016-06-29 NOTE — Progress Notes (Signed)
   Subjective:    Patient ID: Katie Allen, female    DOB: Sep 17, 1950, 66 y.o.   MRN: [id number]  HPI 66 year old female here today to follow-up on hypertension. Her blood pressure is 130/86 today. At last visit May 3 it was 134/78. At that visit we increased Prinzide to 20/25 with follow-up today. Basic metabolic panel drawn today. She feels well on this dose and says blood pressures actually lower at home than what we obtained today.  On April 30 she had a lipid panel with triglycerides of 175 and previously were 146. She needs to get more exercise and watch her diet. TSH was normal.    Review of Systems see above     Objective:   Physical Exam Neck supple. No JVD thyromegaly or carotid bruits. Chest clear. Cardiac exam regular rate and rhythm normal S1 and S2.       Assessment & Plan:  Essential hypertension  Plan: Continue Prinzide 20/25. Patient will continue to monitor blood pressure at home and call if persistently elevated. Sounds like it's doing better on this dose when she is away from the office. Probably has element of office hypertension. Physical exam due after August 31.

## 2016-06-30 LAB — BASIC METABOLIC PANEL
BUN: 14 mg/dL (ref 7–25)
CALCIUM: 9.7 mg/dL (ref 8.6–10.4)
CO2: 25 mmol/L (ref 20–31)
Chloride: 106 mmol/L (ref 98–110)
Creat: 0.72 mg/dL (ref 0.50–0.99)
GLUCOSE: 92 mg/dL (ref 65–99)
Potassium: 3.5 mmol/L (ref 3.5–5.3)
SODIUM: 142 mmol/L (ref 135–146)

## 2016-07-05 NOTE — Patient Instructions (Signed)
Continue same antihypertensive medication regimen and return after August 31 for physical exam

## 2016-07-14 ENCOUNTER — Telehealth: Payer: Self-pay

## 2016-07-14 NOTE — Telephone Encounter (Signed)
Pt called and stated that she wanted to have her Metoprolol refilled by you now instead of Dr. Percival Spanish. She is requesting a 90 day supply be sent in. Please advise

## 2016-07-14 NOTE — Telephone Encounter (Signed)
Please refill for 90 days with 3 refills 

## 2016-07-15 MED ORDER — METOPROLOL TARTRATE 25 MG PO TABS: ORAL_TABLET | ORAL | 3 refills | Status: DC

## 2016-07-15 NOTE — Telephone Encounter (Signed)
Sent electronically 

## 2016-08-05 ENCOUNTER — Other Ambulatory Visit: Payer: Self-pay | Admitting: Internal Medicine

## 2016-10-15 ENCOUNTER — Other Ambulatory Visit: Payer: Self-pay | Admitting: Internal Medicine

## 2016-11-09 ENCOUNTER — Telehealth: Payer: Self-pay

## 2016-11-09 MED ORDER — METOPROLOL TARTRATE 25 MG PO TABS: ORAL_TABLET | ORAL | 3 refills | Status: DC

## 2016-11-09 NOTE — Telephone Encounter (Signed)
Received fax from [facility] in regards to a refill on Metoprolol 25mg  for patient. Medication was refilled per Dr. Verlene Mayer request. Sent 1 year

## 2016-11-10 ENCOUNTER — Other Ambulatory Visit: Payer: Self-pay | Admitting: Internal Medicine

## 2016-11-12 ENCOUNTER — Other Ambulatory Visit: Payer: Self-pay | Admitting: Internal Medicine

## 2016-11-12 DIAGNOSIS — Z1231 Encounter for screening mammogram for malignant neoplasm of breast: Secondary | ICD-10-CM

## 2016-11-25 ENCOUNTER — Telehealth: Payer: Self-pay

## 2016-11-25 DIAGNOSIS — E063 Autoimmune thyroiditis: Secondary | ICD-10-CM

## 2016-11-25 MED ORDER — LEVOTHYROXINE SODIUM 50 MCG PO TABS: ORAL_TABLET | ORAL | 0 refills | Status: DC

## 2016-11-25 NOTE — Telephone Encounter (Signed)
Received fax from [facility] in regards to a refill on Synthroid 89mcg for patient. Medication was refilled per Dr. Verlene Mayer request. Sent 90 days

## 2016-11-30 ENCOUNTER — Ambulatory Visit
Admission: RE | Admit: 2016-11-30 | Discharge: 2016-11-30 | Disposition: A | Discharge status: Home / Self Care | Payer: Medicare Other | Source: Ambulatory Visit | Attending: Internal Medicine | Admitting: Internal Medicine

## 2016-11-30 DIAGNOSIS — Z1231 Encounter for screening mammogram for malignant neoplasm of breast: Secondary | ICD-10-CM | POA: Diagnosis not present

## 2016-11-30 DIAGNOSIS — K1321 Leukoplakia of oral mucosa, including tongue: Secondary | ICD-10-CM | POA: Diagnosis not present

## 2016-11-30 DIAGNOSIS — K1379 Other lesions of oral mucosa: Secondary | ICD-10-CM | POA: Diagnosis not present

## 2016-12-03 DIAGNOSIS — H3561 Retinal hemorrhage, right eye: Secondary | ICD-10-CM | POA: Diagnosis not present

## 2016-12-03 DIAGNOSIS — H43813 Vitreous degeneration, bilateral: Secondary | ICD-10-CM | POA: Diagnosis not present

## 2016-12-03 DIAGNOSIS — H2513 Age-related nuclear cataract, bilateral: Secondary | ICD-10-CM | POA: Diagnosis not present

## 2016-12-03 DIAGNOSIS — H43393 Other vitreous opacities, bilateral: Secondary | ICD-10-CM | POA: Diagnosis not present

## 2016-12-15 DIAGNOSIS — Z23 Encounter for immunization: Secondary | ICD-10-CM | POA: Diagnosis not present

## 2016-12-16 DIAGNOSIS — K1321 Leukoplakia of oral mucosa, including tongue: Secondary | ICD-10-CM | POA: Diagnosis not present

## 2016-12-16 DIAGNOSIS — K1379 Other lesions of oral mucosa: Secondary | ICD-10-CM | POA: Diagnosis not present

## 2016-12-27 ENCOUNTER — Telehealth: Payer: Self-pay | Admitting: Internal Medicine

## 2016-12-27 DIAGNOSIS — K1329 Other disturbances of oral epithelium, including tongue: Secondary | ICD-10-CM | POA: Diagnosis not present

## 2016-12-27 DIAGNOSIS — K1379 Other lesions of oral mucosa: Secondary | ICD-10-CM | POA: Diagnosis not present

## 2016-12-27 DIAGNOSIS — E063 Autoimmune thyroiditis: Secondary | ICD-10-CM

## 2016-12-27 MED ORDER — LEVOTHYROXINE SODIUM 50 MCG PO TABS: ORAL_TABLET | ORAL | 0 refills | Status: DC

## 2016-12-27 NOTE — Telephone Encounter (Signed)
Please refill Synthroid.  Patient has already scheduled her CPE for January and she had her 6 month back in May.  Can we go ahead and call this in for her?    [facility] [place]

## 2016-12-27 NOTE — Telephone Encounter (Signed)
Done

## 2016-12-27 NOTE — Telephone Encounter (Signed)
Please refill x 90 days 

## 2017-01-03 NOTE — Progress Notes (Signed)
HPI The patient presents for followup of dyspnea.  I have seen her before for palpitatons. She has a family history of sudden cardiac death of her brother who had an ICD.  Of note today she tells me a story that makes it sound like he might of had a myomectomy which she still does not have any details about this and does not know whether he has had any genetic testing.   She did wear a monitor in the past and had sinus tachycardia. There were occasional PVCs.   She has been started on a beta blocker which we have been able to titrate. I did send her for a treadmill test and it was stopped secondary to dyspnea. There were PVCs during exercise and recovery but there were no sustained runs.   This was an adequate test and she had no ischemia.  An echo was done.  It demonstrated a normal EF and no significant abnormalities.     She actually feels quite good.  She denies any palpitations, presyncope or syncope.  She does a lot of activity in her house and does yard work and has no symptoms related to this.  She cut back her work hours to 20-30 hours/week and she thinks this helped a lot with her palpitations.   Allergies  Allergen Reactions  . Penicillins Swelling    Swollen tongue    Current Outpatient Medications  Medication Sig Dispense Refill  . Ascorbic Acid (VITAMIN C) 500 MG tablet Take 500 mg by mouth daily.      Marland Kitchen atorvastatin (LIPITOR) 40 MG tablet TAKE 1 TABLET (40 MG TOTAL) BY MOUTH DAILY. 90 tablet 3  . b complex vitamins tablet Take 1 tablet by mouth daily.      . Cholecalciferol (PA VITAMIN D-3) 2000 UNITS CAPS Take by mouth.      . clopidogrel (PLAVIX) 75 MG tablet TAKE 1 TABLET BY MOUTH WITH BREAKFAST 90 tablet 3  . levothyroxine (SYNTHROID, LEVOTHROID) 50 MCG tablet Alternates 65mcg and 50 mcg daily 60 tablet 0  . lisinopril-hydrochlorothiazide (PRINZIDE,ZESTORETIC) 20-25 MG tablet TAKE 1 TABLET BY MOUTH EVERY DAY 90 tablet 1  . magic mouthwash SOLN Do 2 tsbs po 4 times a day,  swish but do not swallow. (Patient taking differently: Do 2 tsbs po 4 times a day, swish but do not swallow. As needed) 16 mL 0  . metoprolol tartrate (LOPRESSOR) 25 MG tablet Take 1.5 tablets (37.5 mg total) by mouth 2 (two) times daily. 270 tablet 3  . Multiple Vitamin (MULTIVITAMIN) tablet Take 1 tablet by mouth daily.      Marland Kitchen omeprazole (PRILOSEC) 20 MG capsule Take 20 mg by mouth daily. OTC     No current facility-administered medications for this visit.     Past Medical History:  Diagnosis Date  . Hashimoto's thyroiditis   . Heart palpitations   . Hyperlipidemia    x years  . Hypertension    x years  . Shingles   . Squamous carcinoma    L arm   . TIA (transient ischemic attack)     Past Surgical History:  Procedure Laterality Date  . CHOLECYSTECTOMY      ROS:  As stated in the HPI and negative for all other systems.   PHYSICAL EXAM BP 124/84   Pulse 71   Ht 5\' 11"  (1.803 m)   Wt 239 lb 3.2 oz (108.5 kg)   BMI 33.36 kg/m   GENERAL:  Well appearing NECK:  No jugular venous distention, waveform within normal limits, carotid upstroke brisk and symmetric, no bruits, no thyromegaly LUNGS:  Clear to auscultation bilaterally CHEST:  Unremarkable HEART:  PMI not displaced or sustained,S1 and S2 within normal limits, no S3, no S4, no clicks, no rubs, no murmurs ABD:  Flat, positive bowel sounds normal in frequency in pitch, no bruits, no rebound, no guarding, no midline pulsatile mass, no hepatomegaly, no splenomegaly EXT:  2 plus pulses throughout, no edema, no cyanosis no clubbing   EKG: Sinus rhythm, rate 70, Axis within normal limits, intervals within normal limits, no acute ST-T wave changes.  ASSESSMENT AND PLAN  Palpitations -  She is no longer bothered by this.  No further testing is indicated.  She will continue the meds as listed.  HTN -  The blood pressure is at target. No change in medications is indicated. We will continue with therapeutic lifestyle changes  (TLC).  OVERWEIGHT:   The patient understands the need to lose weight with diet and exercise. We have discussed specific strategies for this.  FH of SUDDEN CARDIAC DEATH/ICD: She is going to check to see if her brothers never had any genetic testing.  She had an echo last year that demonstrated no phenotypic evidence of hypertrophic cardiomyopathy or other potential etiology of sudden cardiac death.  No further screening is indicated at this time.  CAROTID STENOSIS:  She had mild stenosis five years ago at the time of a TIA.  She will have a Doppler.

## 2017-01-04 ENCOUNTER — Ambulatory Visit (INDEPENDENT_AMBULATORY_CARE_PROVIDER_SITE_OTHER): Payer: Medicare Other | Admitting: Cardiology

## 2017-01-04 ENCOUNTER — Encounter: Payer: Self-pay | Admitting: Cardiology

## 2017-01-04 VITALS — BP 124/84 | HR 71 | Ht 71.0 in | Wt 239.2 lb

## 2017-01-04 DIAGNOSIS — I10 Essential (primary) hypertension: Secondary | ICD-10-CM | POA: Diagnosis not present

## 2017-01-04 DIAGNOSIS — R002 Palpitations: Secondary | ICD-10-CM

## 2017-01-04 DIAGNOSIS — I779 Disorder of arteries and arterioles, unspecified: Secondary | ICD-10-CM

## 2017-01-04 DIAGNOSIS — I739 Peripheral vascular disease, unspecified: Principal | ICD-10-CM

## 2017-01-04 NOTE — Patient Instructions (Signed)
Medication Instructions:  Continue current medications  If you need a refill on your cardiac medications before your next appointment, please call your pharmacy.  Labwork: None Ordered  Testing/Procedures: Your physician has requested that you have a carotid duplex. This test is an ultrasound of the carotid arteries in your neck. It looks at blood flow through these arteries that supply the brain with blood. Allow one hour for this exam. There are no restrictions or special instructions.  Follow-Up: Your physician wants you to follow-up in: 18 Months. You should receive a reminder letter in the mail two months in advance. If you do not receive a letter, please call our office [phone].    Thank you for choosing [facility] at [facility]!!     \

## 2017-01-26 ENCOUNTER — Ambulatory Visit (HOSPITAL_COMMUNITY)
Admission: RE | Admit: 2017-01-26 | Discharge: 2017-01-26 | Disposition: A | Discharge status: Home / Self Care | Payer: Medicare Other | Source: Ambulatory Visit | Attending: Cardiovascular Disease | Admitting: Cardiovascular Disease

## 2017-01-26 DIAGNOSIS — I739 Peripheral vascular disease, unspecified: Secondary | ICD-10-CM

## 2017-01-26 DIAGNOSIS — I779 Disorder of arteries and arterioles, unspecified: Secondary | ICD-10-CM | POA: Diagnosis not present

## 2017-01-27 ENCOUNTER — Other Ambulatory Visit: Payer: Self-pay | Admitting: Internal Medicine

## 2017-01-27 DIAGNOSIS — I10 Essential (primary) hypertension: Secondary | ICD-10-CM

## 2017-01-27 DIAGNOSIS — E785 Hyperlipidemia, unspecified: Secondary | ICD-10-CM

## 2017-01-27 DIAGNOSIS — E039 Hypothyroidism, unspecified: Secondary | ICD-10-CM

## 2017-01-27 DIAGNOSIS — Z1321 Encounter for screening for nutritional disorder: Secondary | ICD-10-CM

## 2017-01-27 DIAGNOSIS — Z0000 Encounter for general adult medical examination without abnormal findings: Secondary | ICD-10-CM

## 2017-01-28 ENCOUNTER — Ambulatory Visit: Payer: BLUE CROSS/BLUE SHIELD | Admitting: Internal Medicine

## 2017-01-30 ENCOUNTER — Other Ambulatory Visit: Payer: Self-pay | Admitting: Internal Medicine

## 2017-02-07 ENCOUNTER — Other Ambulatory Visit: Payer: Medicare Other | Admitting: Internal Medicine

## 2017-02-07 DIAGNOSIS — E785 Hyperlipidemia, unspecified: Secondary | ICD-10-CM | POA: Diagnosis not present

## 2017-02-07 DIAGNOSIS — Z8249 Family history of ischemic heart disease and other diseases of the circulatory system: Secondary | ICD-10-CM | POA: Diagnosis not present

## 2017-02-07 DIAGNOSIS — Z1321 Encounter for screening for nutritional disorder: Secondary | ICD-10-CM | POA: Diagnosis not present

## 2017-02-07 DIAGNOSIS — E063 Autoimmune thyroiditis: Secondary | ICD-10-CM | POA: Diagnosis not present

## 2017-02-07 DIAGNOSIS — Z8673 Personal history of transient ischemic attack (TIA), and cerebral infarction without residual deficits: Secondary | ICD-10-CM | POA: Diagnosis not present

## 2017-02-07 DIAGNOSIS — Z0000 Encounter for general adult medical examination without abnormal findings: Secondary | ICD-10-CM | POA: Diagnosis not present

## 2017-02-07 DIAGNOSIS — E039 Hypothyroidism, unspecified: Secondary | ICD-10-CM | POA: Diagnosis not present

## 2017-02-07 DIAGNOSIS — E2839 Other primary ovarian failure: Secondary | ICD-10-CM | POA: Diagnosis not present

## 2017-02-07 DIAGNOSIS — K219 Gastro-esophageal reflux disease without esophagitis: Secondary | ICD-10-CM | POA: Diagnosis not present

## 2017-02-07 DIAGNOSIS — R002 Palpitations: Secondary | ICD-10-CM | POA: Diagnosis not present

## 2017-02-07 DIAGNOSIS — I10 Essential (primary) hypertension: Secondary | ICD-10-CM

## 2017-02-07 DIAGNOSIS — Z6834 Body mass index (BMI) 34.0-34.9, adult: Secondary | ICD-10-CM | POA: Diagnosis not present

## 2017-02-07 DIAGNOSIS — N3941 Urge incontinence: Secondary | ICD-10-CM | POA: Diagnosis not present

## 2017-02-08 LAB — LIPID PANEL
CHOL/HDL RATIO: 3.7 (calc) (ref <5.0)
Cholesterol: 144 mg/dL (ref <200)
HDL: 39 mg/dL — AB (ref >50)
LDL CHOLESTEROL (CALC): 78 mg/dL
NON-HDL CHOLESTEROL (CALC): 105 mg/dL (ref <130)
TRIGLYCERIDES: 163 mg/dL — AB (ref <150)

## 2017-02-08 LAB — COMPLETE METABOLIC PANEL WITH GFR
AG RATIO: 1.9 (calc) (ref 1.0–2.5)
ALKALINE PHOSPHATASE (APISO): 75 U/L (ref 33–130)
ALT: 20 U/L (ref 6–29)
AST: 14 U/L (ref 10–35)
Albumin: 4.2 g/dL (ref 3.6–5.1)
BILIRUBIN TOTAL: 0.8 mg/dL (ref 0.2–1.2)
BUN: 15 mg/dL (ref 7–25)
CHLORIDE: 104 mmol/L (ref 98–110)
CO2: 30 mmol/L (ref 20–32)
Calcium: 9.7 mg/dL (ref 8.6–10.4)
Creat: 0.65 mg/dL (ref 0.50–0.99)
GFR, Est African American: 108 mL/min/{1.73_m2} (ref >=60)
GFR, Est Non African American: 93 mL/min/{1.73_m2} (ref >=60)
GLUCOSE: 99 mg/dL (ref 65–99)
Globulin: 2.2 g/dL (calc) (ref 1.9–3.7)
POTASSIUM: 3.7 mmol/L (ref 3.5–5.3)
Sodium: 141 mmol/L (ref 135–146)
TOTAL PROTEIN: 6.4 g/dL (ref 6.1–8.1)

## 2017-02-08 LAB — CBC WITH DIFFERENTIAL/PLATELET
BASOS ABS: 20 {cells}/uL (ref 0–200)
Basophils Relative: 0.3 %
EOS PCT: 1.7 %
Eosinophils Absolute: 111 cells/uL (ref 15–500)
HEMATOCRIT: 42.6 % (ref 35.0–45.0)
HEMOGLOBIN: 14.3 g/dL (ref 11.7–15.5)
LYMPHS ABS: 2743 {cells}/uL (ref 850–3900)
MCH: 29 pg (ref 27.0–33.0)
MCHC: 33.6 g/dL (ref 32.0–36.0)
MCV: 86.4 fL (ref 80.0–100.0)
MONOS PCT: 7.5 %
MPV: 11.1 fL (ref 7.5–12.5)
NEUTROS ABS: 3140 {cells}/uL (ref 1500–7800)
Neutrophils Relative %: 48.3 %
Platelets: 250 10*3/uL (ref 140–400)
RBC: 4.93 10*6/uL (ref 3.80–5.10)
RDW: 12.9 % (ref 11.0–15.0)
Total Lymphocyte: 42.2 %
WBC mixed population: 488 cells/uL (ref 200–950)
WBC: 6.5 10*3/uL (ref 3.8–10.8)

## 2017-02-08 LAB — VITAMIN D 25 HYDROXY (VIT D DEFICIENCY, FRACTURES): VIT D 25 HYDROXY: 28 ng/mL — AB (ref 30–100)

## 2017-02-08 LAB — TSH: TSH: 0.87 m[IU]/L (ref 0.40–4.50)

## 2017-02-10 ENCOUNTER — Ambulatory Visit (INDEPENDENT_AMBULATORY_CARE_PROVIDER_SITE_OTHER): Payer: Medicare Other | Admitting: Internal Medicine

## 2017-02-10 ENCOUNTER — Other Ambulatory Visit (HOSPITAL_COMMUNITY)
Admission: RE | Admit: 2017-02-10 | Discharge: 2017-02-10 | Disposition: A | Discharge status: Home / Self Care | Payer: Medicare Other | Source: Ambulatory Visit | Attending: Internal Medicine | Admitting: Internal Medicine

## 2017-02-10 ENCOUNTER — Encounter: Payer: Self-pay | Admitting: Internal Medicine

## 2017-02-10 VITALS — BP 128/70 | HR 70 | Ht 70.5 in | Wt 244.0 lb

## 2017-02-10 DIAGNOSIS — Z8249 Family history of ischemic heart disease and other diseases of the circulatory system: Secondary | ICD-10-CM

## 2017-02-10 DIAGNOSIS — Z6834 Body mass index (BMI) 34.0-34.9, adult: Secondary | ICD-10-CM | POA: Diagnosis not present

## 2017-02-10 DIAGNOSIS — L439 Lichen planus, unspecified: Secondary | ICD-10-CM | POA: Diagnosis not present

## 2017-02-10 DIAGNOSIS — R002 Palpitations: Secondary | ICD-10-CM | POA: Diagnosis not present

## 2017-02-10 DIAGNOSIS — I10 Essential (primary) hypertension: Secondary | ICD-10-CM

## 2017-02-10 DIAGNOSIS — Z23 Encounter for immunization: Secondary | ICD-10-CM | POA: Diagnosis not present

## 2017-02-10 DIAGNOSIS — Z8673 Personal history of transient ischemic attack (TIA), and cerebral infarction without residual deficits: Secondary | ICD-10-CM | POA: Diagnosis not present

## 2017-02-10 DIAGNOSIS — Z124 Encounter for screening for malignant neoplasm of cervix: Secondary | ICD-10-CM | POA: Diagnosis not present

## 2017-02-10 DIAGNOSIS — K219 Gastro-esophageal reflux disease without esophagitis: Secondary | ICD-10-CM

## 2017-02-10 DIAGNOSIS — E785 Hyperlipidemia, unspecified: Secondary | ICD-10-CM | POA: Diagnosis not present

## 2017-02-10 DIAGNOSIS — N3941 Urge incontinence: Secondary | ICD-10-CM

## 2017-02-10 DIAGNOSIS — Z0000 Encounter for general adult medical examination without abnormal findings: Secondary | ICD-10-CM | POA: Diagnosis not present

## 2017-02-10 DIAGNOSIS — E063 Autoimmune thyroiditis: Secondary | ICD-10-CM | POA: Diagnosis not present

## 2017-02-10 LAB — POCT URINALYSIS DIPSTICK
APPEARANCE: NORMAL
BILIRUBIN UA: NEGATIVE
Blood, UA: NEGATIVE
GLUCOSE UA: NEGATIVE
Ketones, UA: NEGATIVE
LEUKOCYTES UA: NEGATIVE
Nitrite, UA: NEGATIVE
Odor: NORMAL
Protein, UA: NEGATIVE
Spec Grav, UA: 1.015 (ref 1.010–1.025)
Urobilinogen, UA: 0.2 E.U./dL
pH, UA: 6 (ref 5.0–8.0)

## 2017-02-10 NOTE — Progress Notes (Signed)
Subjective:    Patient ID: Katie Allen, female    DOB: Aug 12, 1950, 67 y.o.   MRN: [id number]  HPI 67 year old Female for Welcome to Medicare physical exam.  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  2 retinal hemorrhages resulting in large dark floaters. Sees Dr. Zadie Rhine and local eye physician in [place].  Saw Dr. Percival Spanish recent;ly and had doppler showing 1-39% stenosis in each of the carotid arteries.  Had tongue biospy recently by oral surgeon and was told to follow up with Primary Care.  Will be referred to ENT physician.  Addendum: Has been diagnosed with lichen planus and will be followed every 3 months by Dr. Lind Guest at [facility].  History of GE reflux, hypothyroidism, hyperlipidemia, hypertension.  She is maintained on Plavix.  She had a TIA in July 2013 on estrogen replacement.  She had complained of numbness in arm and leg for 2 weeks.  MRI was unremarkable except for small vessel disease.  She is on generic pravastatin for hyperlipidemia.  She takes omeprazole for GE reflux.  Has seen Dr. Cristina Gong in the past.  History of dependent edema.  Works long hours.  Does not get exercise much.  History of PVCs and bigeminy.  History of palpitations and that is why she is followed by cardiologist.  Holter monitor September 2015 showed sinus tachycardia with a rate of 126 bpm.  She is on metoprolol.  Codeine makes her nauseated  She is allergic to penicillin-causes a swollen tongue  She had cholecystectomy in 2010  She had colonoscopy in 2010  Social history: She is married.  Completed high school.  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.  She does clerical work for another business.  She is married.  Has an adult son and lives in [place].  Does not smoke or consume alcohol.  Family history: Father died at age 9 of an MI, stroke with history of abdominal aortic aneurysm.  Mother died at age 66 of colon  cancer.  She has a twin brother.  Twin brother has history of pacemaker, defibrillator with atrial fibrillation.  Another brother in his early 8s is in good health except for hypertension.  Another brother is in good health in his mid 23s.  One sister in good health.      Review of Systems  Respiratory: Negative.   Cardiovascular: Negative.   Gastrointestinal:       Irritable bowel       Objective:   Physical Exam  Constitutional: She is oriented to person, place, and time. She appears well-developed and well-nourished. No distress.  HENT:  Head: Normocephalic and atraumatic.  Right Ear: External ear normal.  Left Ear: External ear normal.  Mouth/Throat: Oropharynx is clear and moist.  Eyes: Conjunctivae and EOM are normal. Pupils are equal, round, and reactive to light. Right eye exhibits no discharge. Left eye exhibits no discharge. No scleral icterus.  Neck: Neck supple. No JVD present. No thyromegaly present.  Cardiovascular: Normal rate, regular rhythm, normal heart sounds and intact distal pulses.  No murmur heard. Pulmonary/Chest: Effort normal and breath sounds normal. No respiratory distress. She has no wheezes. She has no rales. She exhibits no tenderness.  Abdominal: She exhibits no distension and no mass. There is no tenderness. There is no rebound and no guarding.  No bruits  Genitourinary:  Genitourinary Comments: Pap taken.  Bimanual normal.  Musculoskeletal: She exhibits no edema.  Lymphadenopathy:  She has no cervical adenopathy.  Neurological: She is alert and oriented to person, place, and time. She has normal reflexes. No cranial nerve deficit. Coordination normal.  Skin: Skin is warm and dry. No rash noted. She is not diaphoretic.  Psychiatric: She has a normal mood and affect. Her behavior is normal. Judgment and thought content normal.  Vitals reviewed.         Assessment & Plan:  Essential hypertension  Lichen planus-to be followed agrees for  ear nose and throat  Hyperlipidemia-lipid panel shows elevated triglycerides of 163  GE reflux  History of cardiac dysrhythmia followed by cardiologist  History of TIA 2013 on antiplatelet therapy  Hypothyroidism  Cystocele  Family history of abdominal aortic aneurysm in her father  Anxiety regarding her health  Vitamin D deficiency level is 28 needs to take vitamin D supplement  Plan: Continue same medications and return in 6 months.  Recommend repeat screening for AAA.  Had abdominal ultrasound in 2010 that showed normal caliber of aorta and abdomen.  Subjective:   Patient presents for Medicare Annual/Subsequent preventive examination.  Review Past Medical/Family/Social: See above   Risk Factors  Current exercise habits: Does not exercise a lot -mostly sedentary Dietary issues discussed: Low-fat low carbohydrate  Cardiac risk factors: Hyperlipidemia, family history of AAA in father, obesity  Depression Screen  (Note: if answer to either of the following is "Yes", a more complete depression screening is indicated)   Over the past two weeks, have you felt down, depressed or hopeless? No  Over the past two weeks, have you felt little interest or pleasure in doing things? No Have you lost interest or pleasure in daily life? No Do you often feel hopeless? No Do you cry easily over simple problems? No   Activities of Daily Living  In your present state of health, do you have any difficulty performing the following activities?:   Driving? No  Managing money? No  Feeding yourself? No  Getting from bed to chair? No  Climbing a flight of stairs? No  Preparing food and eating?: No  Bathing or showering? No  Getting dressed: No  Getting to the toilet? No  Using the toilet:No  Moving around from place to place: No  In the past year have you fallen or had a near fall?:No  Are you sexually active? No  Do you have more than one partner? No   Hearing Difficulties: No    Do you often ask people to speak up or repeat themselves? No  Do you experience ringing or noises in your ears?  Occasionally Do you have difficulty understanding soft or whispered voices? No  Do you feel that you have a problem with memory?  Sometimes Do you often misplace items?  Occasionally   Home Safety:  Do you have a smoke alarm at your residence? Yes Do you have grab bars in the bathroom?  No Do you have throw rugs in your house?  Yes   Cognitive Testing  Alert? Yes Normal Appearance?Yes  Oriented to person? Yes Place? Yes  Time? Yes  Recall of three objects? Yes  Can perform simple calculations? Yes  Displays appropriate judgment?Yes  Can read the correct time from a watch face?Yes   List the Names of Other Physician/Practitioners you currently use:  See referral list for the physicians patient is currently seeing.     Review of Systems: See above   Objective:     General appearance: Appears stated age and  obese  Head: Normocephalic, without obvious abnormality, atraumatic  Eyes: conj clear, EOMi PEERLA  Ears: normal TM's and external ear canals both ears  Nose: Nares normal. Septum midline. Mucosa normal. No drainage or sinus tenderness.  Throat: lips, mucosa, and tongue normal; teeth and gums normal  Neck: no adenopathy, no carotid bruit, no JVD, supple, symmetrical, trachea midline and thyroid not enlarged, symmetric, no tenderness/mass/nodules  No CVA tenderness.  Lungs: clear to auscultation bilaterally  Breasts: normal appearance, no masses or tenderness Heart: regular rate and rhythm, S1, S2 normal, no murmur, click, rub or gallop  Abdomen: soft, non-tender; bowel sounds normal; no masses, no organomegaly  Musculoskeletal: ROM normal in all joints, no crepitus, no deformity, Normal muscle strengthen. Back  is symmetric, no curvature. Skin: Skin color, texture, turgor normal. No rashes or lesions  Lymph nodes: Cervical, supraclavicular, and axillary  nodes normal.  Neurologic: CN 2 -12 Normal, Normal symmetric reflexes. Normal coordination and gait  Psych: Alert & Oriented x 3, Mood appear stable.    Assessment:    Annual wellness medicare exam   Plan:    During the course of the visit the patient was educated and counseled about appropriate screening and preventive services including:   Annual flu vaccine  Annual mammogram  Prevnar 13 given today     Patient Instructions (the written plan) was given to the patient.  Medicare Attestation  I have personally reviewed:  The patient's medical and social history  Their use of alcohol, tobacco or illicit drugs  Their current medications and supplements  The patient's functional ability including ADLs,fall risks, home safety risks, cognitive, and hearing and visual impairment  Diet and physical activities  Evidence for depression or mood disorders  The patient's weight, height, BMI, and visual acuity have been recorded in the chart. I have made referrals, counseling, and provided education to the patient based on review of the above and I have provided the patient with a written personalized care plan for preventive services.

## 2017-02-11 DIAGNOSIS — H43813 Vitreous degeneration, bilateral: Secondary | ICD-10-CM | POA: Diagnosis not present

## 2017-02-11 DIAGNOSIS — H43393 Other vitreous opacities, bilateral: Secondary | ICD-10-CM | POA: Diagnosis not present

## 2017-02-14 LAB — CYTOLOGY - PAP: DIAGNOSIS: NEGATIVE

## 2017-02-16 ENCOUNTER — Telehealth: Payer: Self-pay

## 2017-02-16 NOTE — Telephone Encounter (Signed)
Scheduled appt with ENT on 02/18/17 at 9:00am. Patient was notified and verbalized understanding. Patient was provided with office phone number and address.

## 2017-02-18 DIAGNOSIS — K219 Gastro-esophageal reflux disease without esophagitis: Secondary | ICD-10-CM | POA: Insufficient documentation

## 2017-02-18 DIAGNOSIS — K148 Other diseases of tongue: Secondary | ICD-10-CM | POA: Diagnosis not present

## 2017-02-18 DIAGNOSIS — R490 Dysphonia: Secondary | ICD-10-CM | POA: Insufficient documentation

## 2017-03-01 ENCOUNTER — Other Ambulatory Visit: Payer: Self-pay | Admitting: Internal Medicine

## 2017-03-01 DIAGNOSIS — E063 Autoimmune thyroiditis: Secondary | ICD-10-CM

## 2017-03-09 NOTE — Patient Instructions (Signed)
It was a pleasure to see you today.  Continue same medications and return in 6 months.  See ENT for regular follow-up of lichen planus.

## 2017-03-16 ENCOUNTER — Other Ambulatory Visit: Payer: Self-pay | Admitting: Internal Medicine

## 2017-03-16 DIAGNOSIS — Z8249 Family history of ischemic heart disease and other diseases of the circulatory system: Secondary | ICD-10-CM

## 2017-03-18 ENCOUNTER — Inpatient Hospital Stay (HOSPITAL_COMMUNITY): Admission: RE | Admit: 2017-03-18 | Payer: Medicare Other | Source: Ambulatory Visit

## 2017-03-21 DIAGNOSIS — D485 Neoplasm of uncertain behavior of skin: Secondary | ICD-10-CM | POA: Diagnosis not present

## 2017-03-21 DIAGNOSIS — L570 Actinic keratosis: Secondary | ICD-10-CM | POA: Diagnosis not present

## 2017-03-21 DIAGNOSIS — L821 Other seborrheic keratosis: Secondary | ICD-10-CM | POA: Diagnosis not present

## 2017-03-29 ENCOUNTER — Telehealth: Payer: Self-pay

## 2017-03-29 NOTE — Telephone Encounter (Signed)
The EKG is scanned into My Chart and she can view it. There is a very minor finding of a first degree AV block which causes no problems.

## 2017-03-29 NOTE — Telephone Encounter (Signed)
Patient was notified, pt verbalized understanding.

## 2017-03-29 NOTE — Telephone Encounter (Signed)
Patient called states that during her CPE she had an EKG done and she remembers you telling her that it was abnormal but she doesn't really remember what it was?

## 2017-03-31 ENCOUNTER — Ambulatory Visit (HOSPITAL_COMMUNITY)
Admission: RE | Admit: 2017-03-31 | Discharge: 2017-03-31 | Disposition: A | Discharge status: Home / Self Care | Payer: Medicare Other | Source: Ambulatory Visit | Attending: Cardiology | Admitting: Cardiology

## 2017-03-31 DIAGNOSIS — I10 Essential (primary) hypertension: Secondary | ICD-10-CM | POA: Insufficient documentation

## 2017-03-31 DIAGNOSIS — E785 Hyperlipidemia, unspecified: Secondary | ICD-10-CM | POA: Diagnosis not present

## 2017-03-31 DIAGNOSIS — Z8249 Family history of ischemic heart disease and other diseases of the circulatory system: Secondary | ICD-10-CM | POA: Diagnosis not present

## 2017-04-27 ENCOUNTER — Telehealth: Payer: Self-pay

## 2017-04-27 NOTE — Telephone Encounter (Signed)
Patient called to cancel her appt for tomorrow she said she got up this morning and her shoulder feels so much better.

## 2017-04-28 ENCOUNTER — Ambulatory Visit: Payer: Medicare Other | Admitting: Internal Medicine

## 2017-05-03 DIAGNOSIS — L570 Actinic keratosis: Secondary | ICD-10-CM | POA: Diagnosis not present

## 2017-05-03 DIAGNOSIS — L820 Inflamed seborrheic keratosis: Secondary | ICD-10-CM | POA: Diagnosis not present

## 2017-05-03 DIAGNOSIS — D485 Neoplasm of uncertain behavior of skin: Secondary | ICD-10-CM | POA: Diagnosis not present

## 2017-05-03 DIAGNOSIS — C44529 Squamous cell carcinoma of skin of other part of trunk: Secondary | ICD-10-CM | POA: Diagnosis not present

## 2017-05-24 DIAGNOSIS — K219 Gastro-esophageal reflux disease without esophagitis: Secondary | ICD-10-CM | POA: Diagnosis not present

## 2017-06-02 ENCOUNTER — Other Ambulatory Visit: Payer: Self-pay | Admitting: Internal Medicine

## 2017-06-02 DIAGNOSIS — E063 Autoimmune thyroiditis: Secondary | ICD-10-CM

## 2017-06-15 DIAGNOSIS — C44529 Squamous cell carcinoma of skin of other part of trunk: Secondary | ICD-10-CM | POA: Diagnosis not present

## 2017-07-27 DIAGNOSIS — L814 Other melanin hyperpigmentation: Secondary | ICD-10-CM | POA: Diagnosis not present

## 2017-07-27 DIAGNOSIS — D485 Neoplasm of uncertain behavior of skin: Secondary | ICD-10-CM | POA: Diagnosis not present

## 2017-07-27 DIAGNOSIS — D225 Melanocytic nevi of trunk: Secondary | ICD-10-CM | POA: Diagnosis not present

## 2017-07-27 DIAGNOSIS — B079 Viral wart, unspecified: Secondary | ICD-10-CM | POA: Diagnosis not present

## 2017-07-27 DIAGNOSIS — Z85828 Personal history of other malignant neoplasm of skin: Secondary | ICD-10-CM | POA: Diagnosis not present

## 2017-07-27 DIAGNOSIS — D1801 Hemangioma of skin and subcutaneous tissue: Secondary | ICD-10-CM | POA: Diagnosis not present

## 2017-07-27 DIAGNOSIS — L821 Other seborrheic keratosis: Secondary | ICD-10-CM | POA: Diagnosis not present

## 2017-07-28 ENCOUNTER — Other Ambulatory Visit: Payer: Self-pay | Admitting: Internal Medicine

## 2017-07-28 DIAGNOSIS — D485 Neoplasm of uncertain behavior of skin: Secondary | ICD-10-CM | POA: Diagnosis not present

## 2017-07-28 DIAGNOSIS — L821 Other seborrheic keratosis: Secondary | ICD-10-CM | POA: Diagnosis not present

## 2017-07-28 DIAGNOSIS — B079 Viral wart, unspecified: Secondary | ICD-10-CM | POA: Diagnosis not present

## 2017-09-07 ENCOUNTER — Other Ambulatory Visit: Payer: Self-pay | Admitting: Internal Medicine

## 2017-09-07 DIAGNOSIS — E039 Hypothyroidism, unspecified: Secondary | ICD-10-CM

## 2017-09-07 DIAGNOSIS — Z79899 Other long term (current) drug therapy: Principal | ICD-10-CM

## 2017-09-07 DIAGNOSIS — E785 Hyperlipidemia, unspecified: Secondary | ICD-10-CM

## 2017-09-07 DIAGNOSIS — Z5181 Encounter for therapeutic drug level monitoring: Secondary | ICD-10-CM

## 2017-09-09 ENCOUNTER — Other Ambulatory Visit: Payer: Medicare Other | Admitting: Internal Medicine

## 2017-09-09 DIAGNOSIS — Z5181 Encounter for therapeutic drug level monitoring: Secondary | ICD-10-CM | POA: Diagnosis not present

## 2017-09-09 DIAGNOSIS — E039 Hypothyroidism, unspecified: Secondary | ICD-10-CM | POA: Diagnosis not present

## 2017-09-09 DIAGNOSIS — E785 Hyperlipidemia, unspecified: Secondary | ICD-10-CM

## 2017-09-09 DIAGNOSIS — Z79899 Other long term (current) drug therapy: Secondary | ICD-10-CM | POA: Diagnosis not present

## 2017-09-09 LAB — HEPATIC FUNCTION PANEL
AG RATIO: 2 (calc) (ref 1.0–2.5)
ALKALINE PHOSPHATASE (APISO): 70 U/L (ref 33–130)
ALT: 18 U/L (ref 6–29)
AST: 15 U/L (ref 10–35)
Albumin: 4.3 g/dL (ref 3.6–5.1)
BILIRUBIN DIRECT: 0.2 mg/dL (ref 0.0–0.2)
BILIRUBIN INDIRECT: 0.8 mg/dL (ref 0.2–1.2)
BILIRUBIN TOTAL: 1 mg/dL (ref 0.2–1.2)
Globulin: 2.1 g/dL (calc) (ref 1.9–3.7)
TOTAL PROTEIN: 6.4 g/dL (ref 6.1–8.1)

## 2017-09-09 LAB — TSH: TSH: 0.88 m[IU]/L (ref 0.40–4.50)

## 2017-09-09 LAB — LIPID PANEL
CHOL/HDL RATIO: 3.8 (calc) (ref <5.0)
Cholesterol: 136 mg/dL (ref <200)
HDL: 36 mg/dL — AB (ref >50)
LDL CHOLESTEROL (CALC): 70 mg/dL
NON-HDL CHOLESTEROL (CALC): 100 mg/dL (ref <130)
TRIGLYCERIDES: 207 mg/dL — AB (ref <150)

## 2017-09-13 ENCOUNTER — Encounter: Payer: Self-pay | Admitting: Internal Medicine

## 2017-09-13 ENCOUNTER — Ambulatory Visit (INDEPENDENT_AMBULATORY_CARE_PROVIDER_SITE_OTHER): Payer: Medicare Other | Admitting: Internal Medicine

## 2017-09-13 VITALS — BP 110/80 | HR 66 | Ht 70.5 in | Wt 245.0 lb

## 2017-09-13 DIAGNOSIS — E785 Hyperlipidemia, unspecified: Secondary | ICD-10-CM | POA: Diagnosis not present

## 2017-09-13 DIAGNOSIS — R7302 Impaired glucose tolerance (oral): Secondary | ICD-10-CM

## 2017-09-13 DIAGNOSIS — Z6834 Body mass index (BMI) 34.0-34.9, adult: Secondary | ICD-10-CM | POA: Diagnosis not present

## 2017-09-13 DIAGNOSIS — E782 Mixed hyperlipidemia: Secondary | ICD-10-CM

## 2017-09-13 DIAGNOSIS — E039 Hypothyroidism, unspecified: Secondary | ICD-10-CM | POA: Diagnosis not present

## 2017-09-13 DIAGNOSIS — K219 Gastro-esophageal reflux disease without esophagitis: Secondary | ICD-10-CM

## 2017-09-13 DIAGNOSIS — I10 Essential (primary) hypertension: Secondary | ICD-10-CM

## 2017-09-13 DIAGNOSIS — Z8673 Personal history of transient ischemic attack (TIA), and cerebral infarction without residual deficits: Secondary | ICD-10-CM | POA: Diagnosis not present

## 2017-09-13 DIAGNOSIS — R002 Palpitations: Secondary | ICD-10-CM

## 2017-09-13 NOTE — Progress Notes (Signed)
   Subjective:    Patient ID: Katie Allen, female    DOB: [birth year], 67 y.o.   MRN: [id number]  HPI   67 year old Female for 6 month  recheck. Awakened a couple of times with palpitations. Husband with health issues.  Ha symptoms most pain is low as s situational stress.EKG done today .It is normal.  I think it would be prudent to have her seen cardiologist in the near future.      Review of Systems overweight, sedentary     Objective:   Physical Exam  Neck: No thyromegaly present.  Cardiovascular: Regular rhythm and normal heart sounds.  Pulmonary/Chest: Effort normal and breath sounds normal. She has no wheezes. She has no rales.  Musculoskeletal: She exhibits no edema.  Skin: Skin is warm and dry.  Psychiatric: She has a normal mood and affect. Her behavior is normal. Judgment and thought content normal.          Assessment & Plan:  Hypertension-potassium is 3.7.  She will try taking 10 mEq potassium chloride daily and recheck in 2 weeks.  Continue beta-blocker, prednisone and Zestoretic.  Hyperlipidemia-triglycerides elevated at 207.  Needs to watch diet and get more exercise.  Currently on Lipitor 40 mg daily.  We may need to increase this..  She is on Lopressor 25 mg daily as well as prednisone and Zestoretic.  Palpitations-she is anxious about this and will be referred to cardiologist  Impaired glucose tolerance-hemoglobin A1c 5.9%.  Needs dietary instruction.

## 2017-09-14 LAB — BASIC METABOLIC PANEL
BUN: 16 mg/dL (ref 7–25)
CO2: 26 mmol/L (ref 20–32)
CREATININE: 0.7 mg/dL (ref 0.50–0.99)
Calcium: 10 mg/dL (ref 8.6–10.4)
Chloride: 105 mmol/L (ref 98–110)
Glucose, Bld: 103 mg/dL — ABNORMAL HIGH (ref 65–99)
Potassium: 3.7 mmol/L (ref 3.5–5.3)
SODIUM: 142 mmol/L (ref 135–146)

## 2017-09-14 LAB — HEMOGLOBIN A1C
EAG (MMOL/L): 6.8 (calc)
Hgb A1c MFr Bld: 5.9 % of total Hgb — ABNORMAL HIGH (ref <5.7)
MEAN PLASMA GLUCOSE: 123 (calc)

## 2017-09-14 MED ORDER — POTASSIUM CHLORIDE ER 10 MEQ PO TBCR: 10.0000 meq | EXTENDED_RELEASE_TABLET | Freq: Every day | ORAL | 0 refills | Status: DC

## 2017-09-27 ENCOUNTER — Other Ambulatory Visit: Payer: Medicare Other | Admitting: Internal Medicine

## 2017-10-03 ENCOUNTER — Other Ambulatory Visit: Payer: Self-pay | Admitting: Internal Medicine

## 2017-10-03 DIAGNOSIS — R002 Palpitations: Secondary | ICD-10-CM

## 2017-10-03 DIAGNOSIS — E876 Hypokalemia: Secondary | ICD-10-CM

## 2017-10-03 NOTE — Patient Instructions (Signed)
Add potassium chloride 10 mEq daily to bring potassium up to the full range.  Refer to cardiology.  Follow-up with potassium in 2 weeks.  Otherwise physical exam due in 6 months.  Needs to watch diet and get more exercise.  Offered dietary consultation.

## 2017-10-04 ENCOUNTER — Other Ambulatory Visit: Payer: Medicare Other | Admitting: Internal Medicine

## 2017-10-04 DIAGNOSIS — E876 Hypokalemia: Secondary | ICD-10-CM

## 2017-10-04 DIAGNOSIS — R002 Palpitations: Secondary | ICD-10-CM

## 2017-10-04 LAB — POTASSIUM: POTASSIUM: 3.9 mmol/L (ref 3.5–5.3)

## 2017-10-11 ENCOUNTER — Other Ambulatory Visit: Payer: Self-pay | Admitting: Internal Medicine

## 2017-10-22 ENCOUNTER — Other Ambulatory Visit: Payer: Self-pay | Admitting: Internal Medicine

## 2017-10-29 ENCOUNTER — Other Ambulatory Visit: Payer: Self-pay | Admitting: Internal Medicine

## 2017-11-07 ENCOUNTER — Other Ambulatory Visit: Payer: Self-pay | Admitting: Internal Medicine

## 2017-11-11 ENCOUNTER — Other Ambulatory Visit: Payer: Self-pay | Admitting: Internal Medicine

## 2017-11-11 DIAGNOSIS — E063 Autoimmune thyroiditis: Secondary | ICD-10-CM

## 2017-11-29 DIAGNOSIS — Z23 Encounter for immunization: Secondary | ICD-10-CM | POA: Diagnosis not present

## 2017-12-09 ENCOUNTER — Ambulatory Visit: Payer: Medicare Other | Admitting: Cardiology

## 2017-12-19 NOTE — Progress Notes (Signed)
HPI The patient presents for followup of dyspnea.  I have seen her before for palpitatons. She has a family history of sudden cardiac death of her brother who had an ICD.  Of note today she tells me a story that makes it sound like he might of had a myomectomy which she still does not have any details about this and does not know whether he has had any genetic testing.   She did wear a monitor in the past and had sinus tachycardia. There were occasional PVCs.   She has been started on a beta blocker which we have been able to titrate. I did send her for a treadmill test and it was stopped secondary to dyspnea. There were PVCs during exercise and recovery but there were no sustained runs.   This was an adequate test and she had no ischemia.  An echo was done.  It demonstrated a normal EF and no significant abnormalities.     Since I last saw her she did wear palpitations.  Is only happen when she is stressed.  She had one episode where she was trying to tighten she could use her left hand but this was short-lived.  She thought it might be carpal tunnel.  She otherwise is felt okay.  She has not been as active as I would like but she does her chores of daily living including vacuuming and working and taking care of her at home.  She denies any cardiovascular symptoms with this. The patient denies any new symptoms such as chest discomfort, neck or arm discomfort. There has been no new shortness of breath, PND or orthopnea. There have been no reported palpitations, presyncope or syncope.    Allergies  Allergen Reactions  . Penicillins Swelling    Swollen tongue    Current Outpatient Medications  Medication Sig Dispense Refill  . Ascorbic Acid (VITAMIN C) 500 MG tablet Take 500 mg by mouth daily.      Marland Kitchen atorvastatin (LIPITOR) 40 MG tablet TAKE 1 TABLET (40 MG TOTAL) BY MOUTH DAILY. 90 tablet 3  . b complex vitamins tablet Take 1 tablet by mouth daily.      . Cholecalciferol (PA VITAMIN D-3) 2000  UNITS CAPS Take by mouth.      . clopidogrel (PLAVIX) 75 MG tablet TAKE 1 TABLET BY MOUTH WITH BREAKFAST 90 tablet 3  . DENTAGEL 1.1 % GEL dental gel     . esomeprazole (NEXIUM) 40 MG capsule Take 40 mg by mouth daily.    Marland Kitchen levothyroxine (SYNTHROID, LEVOTHROID) 50 MCG tablet TAKE 1/2 TABLET EVERY OTHER DAY, AND 1 TABLET ON ALTERNATING DAYS 70 tablet 1  . lisinopril-hydrochlorothiazide (PRINZIDE,ZESTORETIC) 20-25 MG tablet TAKE 1 TABLET BY MOUTH EVERY DAY 90 tablet 1  . metoprolol tartrate (LOPRESSOR) 25 MG tablet TAKE 1.5 TABLETS (37.5 MG TOTAL) BY MOUTH 2 (TWO) TIMES DAILY. 270 tablet 3  . Multiple Vitamin (MULTIVITAMIN) tablet Take 1 tablet by mouth daily.      . potassium chloride (K-DUR) 10 MEQ tablet TAKE 1 TABLET BY MOUTH EVERY DAY 90 tablet 3  . ranitidine (ZANTAC) 150 MG tablet Take 150 mg by mouth daily.     No current facility-administered medications for this visit.     Past Medical History:  Diagnosis Date  . Hashimoto's thyroiditis   . Heart palpitations   . Hyperlipidemia    x years  . Hypertension    x years  . Shingles   . Squamous carcinoma  L arm   . TIA (transient ischemic attack)     Past Surgical History:  Procedure Laterality Date  . CHOLECYSTECTOMY      ROS:  As stated in the HPI and negative for all other systems.   PHYSICAL EXAM BP 122/70   Pulse 73   Ht 5\' 11"  (1.803 m)   Wt 244 lb (110.7 kg)   BMI 34.03 kg/m   GENERAL:  Well appearing NECK:  No jugular venous distention, waveform within normal limits, carotid upstroke brisk and symmetric, no bruits, no thyromegaly LUNGS:  Clear to auscultation bilaterally CHEST:  Unremarkable HEART:  PMI not displaced or sustained,S1 and S2 within normal limits, no S3, no S4, no clicks, no rubs, no murmurs ABD:  Flat, positive bowel sounds normal in frequency in pitch, no bruits, no rebound, no guarding, no midline pulsatile mass, no hepatomegaly, no splenomegaly EXT:  2 plus pulses throughout, no edema,  no cyanosis no clubbing   EKG: Sinus rhythm, rate 73, Axis within normal limits, intervals within normal limits, no acute ST-T wave changes.  Baseline artifact 12/20/2017  ASSESSMENT AND PLAN  Palpitations -  These are not particularly bothersome.  No change in therapy.  HTN -  The blood pressure is at target.  No change in therapy.   OVERWEIGHT:   She is going to join the [facility] and we discussed weight loss strategies.   FH of SUDDEN CARDIAC DEATH/ICD: She is going to check to see if her brothers never had any genetic testing.  She had an echo last year that demonstrated no phenotypic evidence of hypertrophic cardiomyopathy or other potential etiology of sudden cardiac death.  Today she was not sure about the story on myomectomy but she thinks over Thanksgiving she can try to get a better family history.   CAROTID STENOSIS:  This was very mild on Doppler last year and no further work up is indicated at this point.

## 2017-12-20 ENCOUNTER — Ambulatory Visit (INDEPENDENT_AMBULATORY_CARE_PROVIDER_SITE_OTHER): Payer: Medicare Other | Admitting: Cardiology

## 2017-12-20 ENCOUNTER — Encounter: Payer: Self-pay | Admitting: Cardiology

## 2017-12-20 VITALS — BP 122/70 | HR 73 | Ht 71.0 in | Wt 244.0 lb

## 2017-12-20 DIAGNOSIS — R002 Palpitations: Secondary | ICD-10-CM | POA: Insufficient documentation

## 2017-12-20 DIAGNOSIS — Z8489 Family history of other specified conditions: Secondary | ICD-10-CM | POA: Diagnosis not present

## 2017-12-20 NOTE — Patient Instructions (Signed)

## 2018-01-20 DIAGNOSIS — D123 Benign neoplasm of transverse colon: Secondary | ICD-10-CM | POA: Diagnosis not present

## 2018-01-20 DIAGNOSIS — K635 Polyp of colon: Secondary | ICD-10-CM | POA: Diagnosis not present

## 2018-01-20 DIAGNOSIS — Z86010 Personal history of colonic polyps: Secondary | ICD-10-CM | POA: Diagnosis not present

## 2018-01-24 DIAGNOSIS — D123 Benign neoplasm of transverse colon: Secondary | ICD-10-CM | POA: Diagnosis not present

## 2018-01-24 DIAGNOSIS — K635 Polyp of colon: Secondary | ICD-10-CM | POA: Diagnosis not present

## 2018-01-26 ENCOUNTER — Other Ambulatory Visit: Payer: Self-pay | Admitting: Internal Medicine

## 2018-02-28 ENCOUNTER — Other Ambulatory Visit: Payer: Medicare Other | Admitting: Internal Medicine

## 2018-02-28 DIAGNOSIS — E785 Hyperlipidemia, unspecified: Secondary | ICD-10-CM | POA: Diagnosis not present

## 2018-02-28 DIAGNOSIS — R7302 Impaired glucose tolerance (oral): Secondary | ICD-10-CM

## 2018-02-28 DIAGNOSIS — I10 Essential (primary) hypertension: Secondary | ICD-10-CM | POA: Diagnosis not present

## 2018-02-28 DIAGNOSIS — K219 Gastro-esophageal reflux disease without esophagitis: Secondary | ICD-10-CM | POA: Diagnosis not present

## 2018-02-28 DIAGNOSIS — E039 Hypothyroidism, unspecified: Secondary | ICD-10-CM | POA: Diagnosis not present

## 2018-02-28 DIAGNOSIS — Z0000 Encounter for general adult medical examination without abnormal findings: Secondary | ICD-10-CM

## 2018-03-01 LAB — CBC WITH DIFFERENTIAL/PLATELET
Absolute Monocytes: 371 cells/uL (ref 200–950)
BASOS PCT: 0.5 %
Basophils Absolute: 32 cells/uL (ref 0–200)
EOS ABS: 122 {cells}/uL (ref 15–500)
Eosinophils Relative: 1.9 %
HEMATOCRIT: 43.1 % (ref 35.0–45.0)
Hemoglobin: 14.7 g/dL (ref 11.7–15.5)
LYMPHS ABS: 2746 {cells}/uL (ref 850–3900)
MCH: 29.5 pg (ref 27.0–33.0)
MCHC: 34.1 g/dL (ref 32.0–36.0)
MCV: 86.5 fL (ref 80.0–100.0)
MPV: 10.9 fL (ref 7.5–12.5)
Monocytes Relative: 5.8 %
NEUTROS ABS: 3130 {cells}/uL (ref 1500–7800)
Neutrophils Relative %: 48.9 %
PLATELETS: 269 10*3/uL (ref 140–400)
RBC: 4.98 10*6/uL (ref 3.80–5.10)
RDW: 13.2 % (ref 11.0–15.0)
Total Lymphocyte: 42.9 %
WBC: 6.4 10*3/uL (ref 3.8–10.8)

## 2018-03-01 LAB — COMPLETE METABOLIC PANEL WITH GFR
AG RATIO: 1.9 (calc) (ref 1.0–2.5)
ALT: 19 U/L (ref 6–29)
AST: 14 U/L (ref 10–35)
Albumin: 4.1 g/dL (ref 3.6–5.1)
Alkaline phosphatase (APISO): 70 U/L (ref 33–130)
BUN: 17 mg/dL (ref 7–25)
CALCIUM: 10 mg/dL (ref 8.6–10.4)
CO2: 30 mmol/L (ref 20–32)
Chloride: 106 mmol/L (ref 98–110)
Creat: 0.59 mg/dL (ref 0.50–0.99)
GFR, EST AFRICAN AMERICAN: 111 mL/min/{1.73_m2} (ref >=60)
GFR, EST NON AFRICAN AMERICAN: 96 mL/min/{1.73_m2} (ref >=60)
GLOBULIN: 2.2 g/dL (ref 1.9–3.7)
Glucose, Bld: 87 mg/dL (ref 65–99)
POTASSIUM: 4.3 mmol/L (ref 3.5–5.3)
Sodium: 144 mmol/L (ref 135–146)
TOTAL PROTEIN: 6.3 g/dL (ref 6.1–8.1)
Total Bilirubin: 0.7 mg/dL (ref 0.2–1.2)

## 2018-03-01 LAB — LIPID PANEL
CHOL/HDL RATIO: 3.6 (calc) (ref <5.0)
Cholesterol: 133 mg/dL (ref <200)
HDL: 37 mg/dL — ABNORMAL LOW (ref >50)
LDL CHOLESTEROL (CALC): 74 mg/dL
NON-HDL CHOLESTEROL (CALC): 96 mg/dL (ref <130)
TRIGLYCERIDES: 134 mg/dL (ref <150)

## 2018-03-01 LAB — HEMOGLOBIN A1C
EAG (MMOL/L): 7 (calc)
HEMOGLOBIN A1C: 6 %{Hb} — AB (ref <5.7)
Mean Plasma Glucose: 126 (calc)

## 2018-03-01 LAB — TSH: TSH: 0.96 m[IU]/L (ref 0.40–4.50)

## 2018-03-02 ENCOUNTER — Ambulatory Visit (INDEPENDENT_AMBULATORY_CARE_PROVIDER_SITE_OTHER): Payer: Medicare Other | Admitting: Internal Medicine

## 2018-03-02 ENCOUNTER — Encounter: Payer: Self-pay | Admitting: Internal Medicine

## 2018-03-02 VITALS — BP 102/80 | HR 68 | Ht 71.0 in | Wt 248.0 lb

## 2018-03-02 DIAGNOSIS — Z0000 Encounter for general adult medical examination without abnormal findings: Secondary | ICD-10-CM | POA: Diagnosis not present

## 2018-03-02 DIAGNOSIS — Z8241 Family history of sudden cardiac death: Secondary | ICD-10-CM | POA: Diagnosis not present

## 2018-03-02 DIAGNOSIS — K219 Gastro-esophageal reflux disease without esophagitis: Secondary | ICD-10-CM

## 2018-03-02 DIAGNOSIS — R002 Palpitations: Secondary | ICD-10-CM

## 2018-03-02 DIAGNOSIS — L439 Lichen planus, unspecified: Secondary | ICD-10-CM

## 2018-03-02 DIAGNOSIS — N3941 Urge incontinence: Secondary | ICD-10-CM

## 2018-03-02 DIAGNOSIS — R7302 Impaired glucose tolerance (oral): Secondary | ICD-10-CM

## 2018-03-02 DIAGNOSIS — Z8249 Family history of ischemic heart disease and other diseases of the circulatory system: Secondary | ICD-10-CM

## 2018-03-02 DIAGNOSIS — Z6834 Body mass index (BMI) 34.0-34.9, adult: Secondary | ICD-10-CM

## 2018-03-02 DIAGNOSIS — I10 Essential (primary) hypertension: Secondary | ICD-10-CM

## 2018-03-02 DIAGNOSIS — E039 Hypothyroidism, unspecified: Secondary | ICD-10-CM | POA: Diagnosis not present

## 2018-03-02 DIAGNOSIS — Z23 Encounter for immunization: Secondary | ICD-10-CM | POA: Diagnosis not present

## 2018-03-02 DIAGNOSIS — Z8673 Personal history of transient ischemic attack (TIA), and cerebral infarction without residual deficits: Secondary | ICD-10-CM

## 2018-03-02 DIAGNOSIS — E782 Mixed hyperlipidemia: Secondary | ICD-10-CM

## 2018-03-02 DIAGNOSIS — Z800 Family history of malignant neoplasm of digestive organs: Secondary | ICD-10-CM | POA: Diagnosis not present

## 2018-03-02 LAB — POCT URINALYSIS DIPSTICK
Appearance: NEGATIVE
BILIRUBIN UA: NEGATIVE
Blood, UA: NEGATIVE
GLUCOSE UA: NEGATIVE
Ketones, UA: NEGATIVE
Leukocytes, UA: NEGATIVE
Nitrite, UA: NEGATIVE
Odor: NEGATIVE
Protein, UA: NEGATIVE
SPEC GRAV UA: 1.015 (ref 1.010–1.025)
Urobilinogen, UA: 0.2 E.U./dL
pH, UA: 6.5 (ref 5.0–8.0)

## 2018-03-02 NOTE — Patient Instructions (Signed)
It was a pleasure to see you today.  Try to get rest, watch diet and get some light exercise.  Continue same medications.  Please have colonoscopy.  Follow-up in 6 months.

## 2018-03-02 NOTE — Progress Notes (Signed)
Subjective:    Patient ID: Katie Allen, female    DOB: 1950/06/16, 68 y.o.   MRN: [id number]  HPI 68 year old Female for health maintenance exam, Medicare wellness exam, and evaluation of medical issues.  Husband has had several medical issues and that has been stressful to patient.  She does not want to be on antidepressant medication.  History of squamous cell carcinoma left arm 2018.  2 retinal hemorrhages resulting in floaters and has seen Dr. Zadie Rhine as well as local eye physician in [place].  Dr. Percival Spanish is her Cardiologist.  Has been diagnosed with lichen planus and followed every 3 months via [facility] ear nose and throat.  Has developed shallow ulcerations lateral tongue.  History of GE reflux, hypothyroidism, hyperlipidemia, hypertension.  She is maintained on Plavix.  She had a TIA in July 2013 on estrogen replacement.  She complained of numbness in the arm and leg for 2 weeks.  MRI was unremarkable except for small vessel disease.  She is on generic pravastatin for hyperlipidemia.  History of impaired glucose tolerance.   She takes omeprazole for GE reflux.  Has seen Dr. Cristina Gong in the past.  History of dependent edema  In February 2019 was screened for abdominal aortic aneurysm due to family history and there was no history of abdominal aortic aneurysm.  Does not exercise much as she works long hours  History of PVCs and bigeminy.  History of palpitations  Holter monitor September 2015 showed sinus tachycardia with a rate of 126 bpm and occasional PVCs.  She is on metoprolol.  On treadmill test which was stopped due to dyspnea there  were PVCs during exercise and recovery but no sustained runs.  Codeine makes her nauseated  Given pneumococcal 23 vaccine today  She is allergic to penicillin-causes a swollen tongue  She had colonoscopy in 2010.  Follow-up needed.  She had cholecystectomy in 2010.  History of mild carotid stenosis 1 to 39% followed by Dr. Percival Spanish  with Doppler study last done 2018.   Social history: She is married.  Husband ill recently with several issues including heart failure.  She completed high school.  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  She does clerical work for another business.  Has 1 adult son.  Lives in [place].  Does not smoke or consume alcohol.  Family history: Father died at age 44 of an MI, stroke with history of abdominal aortic aneurysm.  Mother died at age 20 of colon cancer.  Had twin brother.  Twin had history of pacemaker and defibrillator with atrial fibrillation apparently had sudden cardiac death.  Another brother in his early 72s in good health with hypertension.  Another brother in good health in his mid 22s.  One sister in good health.    Review of Systems  Constitutional: Negative.   HENT:       Right lateral tongue ulcerations with history of lichen planus  Respiratory: Negative.   Cardiovascular: Positive for leg swelling.  Genitourinary:       Issues with urinary incontinence and wears pad  Neurological: Negative.   Psychiatric/Behavioral: Negative.        Situational stress with husband's medical issues       Objective:   Physical Exam Vitals signs reviewed.  Constitutional:      Appearance: Normal appearance.  HENT:     Head: Normocephalic and atraumatic.     Right Ear: Tympanic  membrane and ear canal normal.     Left Ear: Tympanic membrane and ear canal normal.     Nose: No congestion or rhinorrhea.     Mouth/Throat:     Mouth: Mucous membranes are moist.     Pharynx: Oropharynx is clear.  Eyes:     General: No scleral icterus.       Right eye: No discharge.        Left eye: No discharge.     Extraocular Movements: Extraocular movements intact.     Conjunctiva/sclera: Conjunctivae normal.     Pupils: Pupils are equal, round, and reactive to light.  Neck:     Musculoskeletal: Neck supple. No neck rigidity.    Cardiovascular:     Rate and Rhythm: Normal rate and regular rhythm.     Heart sounds: Normal heart sounds. No murmur.  Pulmonary:     Effort: Pulmonary effort is normal. No respiratory distress.     Breath sounds: Normal breath sounds. No wheezing.     Comments: Breasts without masses Abdominal:     General: Bowel sounds are normal.     Palpations: Abdomen is soft. There is no mass.  Genitourinary:    Comments: Last Pap 2019 and will not be repeated due to age.  Bimanual normal. Musculoskeletal:        General: No swelling, tenderness or deformity.     Right lower leg: No edema.  Skin:    General: Skin is warm and dry.     Findings: No rash.     Comments: Shallow ulcerations lateral tongue  Neurological:     General: No focal deficit present.     Mental Status: She is alert and oriented to person, place, and time.     Cranial Nerves: No cranial nerve deficit.     Sensory: No sensory deficit.     Motor: No weakness.     Coordination: Coordination normal.  Psychiatric:        Mood and Affect: Mood normal.        Behavior: Behavior normal.        Thought Content: Thought content normal.        Judgment: Judgment normal.           Assessment & Plan:  Family history of colon cancer-needs repeat study  History of palpitations and occasional PVCs treated with beta-blocker and followed by Dr. Percival Spanish  Lichen planus followed by ENT  GE reflux-stable on PPI  Hypothyroidism-stable on thyroid replacement  Hyperlipidemia-stable on statin and lipids within normal limits  Low HDL  Essential hypertension-stable on current regimen  History of TIA 2013 maintained on Plavix and statin  Dependent edema treated with diuretic  Impaired glucose tolerance-treated with diet.  Hemoglobin A1c 6%  Family history of heart disease-continue to follow closely  Plan: Encourage diet exercise and weight loss.  Follow-up in 6 months.  Continue same medications.  Subjective:    Patient presents for Medicare Annual/Subsequent preventive examination.  Review Past Medical/Family/Social: See above   Risk Factors  Current exercise habits: Mostly sedentary Dietary issues discussed: Low-fat low carbohydrate  Cardiac risk factors: Family history, hyperlipidemia, impaired glucose tolerance  Depression Screen  (Note: if answer to either of the following is "Yes", a more complete depression screening is indicated)   Over the past two weeks, have you felt down, depressed or hopeless?  Felt down due to stress with my husband's health Over the past two weeks, have you felt little interest or pleasure  in doing things?  Yes see above have you lost interest or pleasure in daily life?  Somewhat see above Do you often feel hopeless? No Do you cry easily over simple problems? No   Activities of Daily Living  In your present state of health, do you have any difficulty performing the following activities?:   Driving? No  Managing money? No  Feeding yourself? No  Getting from bed to chair? No  Climbing a flight of stairs? No  Preparing food and eating?: No  Bathing or showering? No  Getting dressed: No  Getting to the toilet? No  Using the toilet:No  Moving around from place to place: No  In the past year have you fallen or had a near fall?:No  Are you sexually active? No  Do you have more than one partner? No   Hearing Difficulties: No  Do you often ask people to speak up or repeat themselves? No  Do you experience ringing or noises in your ears?  Occasionally Do you have difficulty understanding soft or whispered voices? No  Do you feel that you have a problem with memory?  Occasional issues with finding a word Do you often misplace items? No    Home Safety:  Do you have a smoke alarm at your residence? Yes Do you have grab bars in the bathroom?  No Do you have throw rugs in your house?  Yes   Cognitive Testing  Alert? Yes Normal Appearance?Yes  Oriented  to person? Yes Place? Yes  Time? Yes  Recall of three objects? Yes  Can perform simple calculations? Yes  Displays appropriate judgment?Yes  Can read the correct time from a watch face?Yes   List the Names of Other Physician/Practitioners you currently use:  See referral list for the physicians patient is currently seeing.  Dr. Percival Spanish   Review of Systems: See above  Objective:     General appearance: Appears stated age and  obese  Head: Normocephalic, without obvious abnormality, atraumatic  Eyes: conj clear, EOMi PEERLA  Ears: normal TM's and external ear canals both ears  Nose: Nares normal. Septum midline. Mucosa normal. No drainage or sinus tenderness.  Throat: lips, mucosa, and tongue normal; teeth and gums normal  Neck: no adenopathy, no carotid bruit, no JVD, supple, symmetrical, trachea midline and thyroid not enlarged, symmetric, no tenderness/mass/nodules  No CVA tenderness.  Lungs: clear to auscultation bilaterally  Breasts: normal appearance, no masses or tenderness, top of the pacemaker on left upper chest. Incision well-healed. It is tender.  Heart: regular rate and rhythm, S1, S2 normal, no murmur, click, rub or gallop  Abdomen: soft, non-tender; bowel sounds normal; no masses, no organomegaly  Musculoskeletal: ROM normal in all joints, no crepitus, no deformity, Normal muscle strengthen. Back  is symmetric, no curvature. Skin: Skin color, texture, turgor normal. No rashes or lesions  Lymph nodes: Cervical, supraclavicular, and axillary nodes normal.  Neurologic: CN 2 -12 Normal, Normal symmetric reflexes. Normal coordination and gait  Psych: Alert & Oriented x 3, Mood appear stable.    Assessment:    Annual wellness medicare exam   Plan:    During the course of the visit the patient was educated and counseled about appropriate screening and preventive services including:        Patient Instructions (the written plan) was given to the patient.   Medicare Attestation  I have personally reviewed:  The patient's medical and social history  Their use of alcohol, tobacco or illicit drugs  Their current medications and supplements  The patient's functional ability including ADLs,fall risks, home safety risks, cognitive, and hearing and visual impairment  Diet and physical activities  Evidence for depression or mood disorders  The patient's weight, height, BMI, and visual acuity have been recorded in the chart. I have made referrals, counseling, and provided education to the patient based on review of the above and I have provided the patient with a written personalized care plan for preventive services.

## 2018-04-27 ENCOUNTER — Other Ambulatory Visit: Payer: Self-pay | Admitting: Internal Medicine

## 2018-04-27 DIAGNOSIS — E063 Autoimmune thyroiditis: Secondary | ICD-10-CM

## 2018-07-22 ENCOUNTER — Other Ambulatory Visit: Payer: Self-pay | Admitting: Internal Medicine

## 2018-08-16 DIAGNOSIS — L821 Other seborrheic keratosis: Secondary | ICD-10-CM | POA: Diagnosis not present

## 2018-08-16 DIAGNOSIS — D225 Melanocytic nevi of trunk: Secondary | ICD-10-CM | POA: Diagnosis not present

## 2018-08-16 DIAGNOSIS — L814 Other melanin hyperpigmentation: Secondary | ICD-10-CM | POA: Diagnosis not present

## 2018-08-16 DIAGNOSIS — L918 Other hypertrophic disorders of the skin: Secondary | ICD-10-CM | POA: Diagnosis not present

## 2018-08-16 DIAGNOSIS — B078 Other viral warts: Secondary | ICD-10-CM | POA: Diagnosis not present

## 2018-08-16 DIAGNOSIS — Z85828 Personal history of other malignant neoplasm of skin: Secondary | ICD-10-CM | POA: Diagnosis not present

## 2018-08-28 ENCOUNTER — Other Ambulatory Visit: Payer: Self-pay | Admitting: Internal Medicine

## 2018-08-28 DIAGNOSIS — Z1231 Encounter for screening mammogram for malignant neoplasm of breast: Secondary | ICD-10-CM

## 2018-08-29 ENCOUNTER — Other Ambulatory Visit: Payer: Medicare Other | Admitting: Internal Medicine

## 2018-08-29 ENCOUNTER — Other Ambulatory Visit: Payer: Self-pay

## 2018-08-29 DIAGNOSIS — R7302 Impaired glucose tolerance (oral): Secondary | ICD-10-CM

## 2018-08-29 DIAGNOSIS — E782 Mixed hyperlipidemia: Secondary | ICD-10-CM | POA: Diagnosis not present

## 2018-08-29 DIAGNOSIS — E039 Hypothyroidism, unspecified: Secondary | ICD-10-CM | POA: Diagnosis not present

## 2018-08-30 LAB — MICROALBUMIN / CREATININE URINE RATIO
Creatinine, Urine: 83 mg/dL (ref 20–275)
Microalb Creat Ratio: 4 mcg/mg creat (ref <30)
Microalb, Ur: 0.3 mg/dL

## 2018-08-30 LAB — HEPATIC FUNCTION PANEL
AG Ratio: 2 (calc) (ref 1.0–2.5)
ALT: 19 U/L (ref 6–29)
AST: 17 U/L (ref 10–35)
Albumin: 4.2 g/dL (ref 3.6–5.1)
Alkaline phosphatase (APISO): 63 U/L (ref 37–153)
Bilirubin, Direct: 0.1 mg/dL (ref 0.0–0.2)
Globulin: 2.1 g/dL (calc) (ref 1.9–3.7)
Indirect Bilirubin: 0.6 mg/dL (calc) (ref 0.2–1.2)
Total Bilirubin: 0.7 mg/dL (ref 0.2–1.2)
Total Protein: 6.3 g/dL (ref 6.1–8.1)

## 2018-08-30 LAB — LIPID PANEL
Cholesterol: 141 mg/dL (ref <200)
HDL: 38 mg/dL — ABNORMAL LOW (ref >=50)
LDL Cholesterol (Calc): 75 mg/dL (calc)
Non-HDL Cholesterol (Calc): 103 mg/dL (calc) (ref <130)
Total CHOL/HDL Ratio: 3.7 (calc) (ref <5.0)
Triglycerides: 180 mg/dL — ABNORMAL HIGH (ref <150)

## 2018-08-30 LAB — HEMOGLOBIN A1C
Hgb A1c MFr Bld: 5.9 % of total Hgb — ABNORMAL HIGH (ref <5.7)
Mean Plasma Glucose: 123 (calc)
eAG (mmol/L): 6.8 (calc)

## 2018-08-31 ENCOUNTER — Ambulatory Visit (INDEPENDENT_AMBULATORY_CARE_PROVIDER_SITE_OTHER): Payer: Medicare Other | Admitting: Internal Medicine

## 2018-08-31 ENCOUNTER — Encounter: Payer: Self-pay | Admitting: Internal Medicine

## 2018-08-31 VITALS — BP 120/80 | HR 66 | Temp 97.7°F | Ht 71.0 in | Wt 247.0 lb

## 2018-08-31 DIAGNOSIS — Z87898 Personal history of other specified conditions: Secondary | ICD-10-CM | POA: Diagnosis not present

## 2018-08-31 DIAGNOSIS — E039 Hypothyroidism, unspecified: Secondary | ICD-10-CM

## 2018-08-31 DIAGNOSIS — R7302 Impaired glucose tolerance (oral): Secondary | ICD-10-CM

## 2018-08-31 DIAGNOSIS — I10 Essential (primary) hypertension: Secondary | ICD-10-CM | POA: Diagnosis not present

## 2018-08-31 DIAGNOSIS — E782 Mixed hyperlipidemia: Secondary | ICD-10-CM

## 2018-08-31 DIAGNOSIS — Z6834 Body mass index (BMI) 34.0-34.9, adult: Secondary | ICD-10-CM | POA: Diagnosis not present

## 2018-08-31 NOTE — Patient Instructions (Signed)
It was a pleasure to see you today.  Continue to work on diet exercise and weight loss and continue current medications.  Watch fat in diet.  Try to get some exercise.  Health maintenance exam and annual Medicare wellness exam due in 6 months.  Have flu vaccine in the Fall.

## 2018-08-31 NOTE — Progress Notes (Signed)
   Subjective:    Patient ID: Katie Allen, female    DOB: 10/08/1950, 68 y.o.   MRN: [id number]  HPI  68 year old Female in today for 6 month recheck.  Feeling well without new complaints.  History of hyperlipidemia, hypertension, hypothyroidism.  History of palpitations.  Palpitations were evaluated by Dr. Percival Spanish in November 2019.  She had occasional PVCs on Holter monitor.  Was treated with beta-blocker. Follow-up visit recommended by Cardiology in 1 year which will be November 2020.  Currently on Zestoretic 20/25 daily, metoprolol 25 mg but taking 1.5 tablets daily for total of 37.5 mg daily.  She is on Lipitor 40 mg daily.  Taking potassium supplement K-Dur 10 mEq daily.  With regard to hypothyroidism taking levothyroxine 0.05 mg every other day and one half of 0.95 mg tablet every other day.  TSH was not checked with this visit.  It was normal in January.  Hemoglobin A1c 5.9% was 6% 6 months ago.  Liver functions are normal.  Triglycerides are elevated at 180 and previously in January were 134.  He has a low HDL of 38.  LDL is normal at 75.  Patient admits she is probably eating too much home-cooked food.  In January she weighed 248 pounds and BMI was 34.59.  Now weighs 247 pounds with BMI of 34.45.    Review of Systems no new complaints     Objective:   Physical Exam Blood pressure 120/80 pulse 66 temperature 97.7 weight 247 pounds.  Skin warm and dry.  Nodes none.  No thyromegaly.  No carotid bruits.  Chest is clear to auscultation without rales or wheezing.  Cardiac exam regular rate and rhythm normal S1 and S2 without murmurs or gallops.  Extremities without pitting edema.       Assessment & Plan:  Essential hypertension-stable on Zestoretic and metoprolol  History of palpitations treated with metoprolol and stable  Hypothyroidism-stable with current regimen of thyroid replacement medication but TSH was not checked with this visit.  BMI 34-needs to diet exercise and  lose weight.  Hyperlipidemia-lipid panel shows elevated triglycerides of 188 and previously were 134 in January.  She has a low HDL cholesterol of 38.  Total cholesterol and LDL cholesterol levels are normal.  She is on generic Lipitor 40 mg daily.  Reevaluate in 6 months.  Encourage to watch fat in diet and try to get more exercise.  Plan: Follow-up with annual Medicare wellness and health maintenance exam with fasting labs  in 6 months.

## 2018-09-26 ENCOUNTER — Other Ambulatory Visit: Payer: Self-pay

## 2018-09-26 ENCOUNTER — Ambulatory Visit
Admission: RE | Admit: 2018-09-26 | Discharge: 2018-09-26 | Disposition: A | Discharge status: Home / Self Care | Payer: Medicare Other | Source: Ambulatory Visit | Attending: Internal Medicine | Admitting: Internal Medicine

## 2018-09-26 DIAGNOSIS — Z1231 Encounter for screening mammogram for malignant neoplasm of breast: Secondary | ICD-10-CM | POA: Diagnosis not present

## 2018-10-11 ENCOUNTER — Other Ambulatory Visit: Payer: Self-pay | Admitting: Internal Medicine

## 2018-10-11 DIAGNOSIS — E063 Autoimmune thyroiditis: Secondary | ICD-10-CM

## 2018-10-17 ENCOUNTER — Other Ambulatory Visit: Payer: Self-pay | Admitting: Internal Medicine

## 2018-10-19 ENCOUNTER — Other Ambulatory Visit: Payer: Self-pay | Admitting: Internal Medicine

## 2018-11-02 ENCOUNTER — Other Ambulatory Visit: Payer: Self-pay | Admitting: Internal Medicine

## 2019-01-23 ENCOUNTER — Other Ambulatory Visit: Payer: Self-pay | Admitting: Internal Medicine

## 2019-02-13 DIAGNOSIS — Z7189 Other specified counseling: Secondary | ICD-10-CM | POA: Insufficient documentation

## 2019-02-13 DIAGNOSIS — E663 Overweight: Secondary | ICD-10-CM | POA: Insufficient documentation

## 2019-02-13 NOTE — Progress Notes (Signed)
Cardiology Office Note   Date:  02/15/2019   ID:  Katie Allen, DOB 1950-08-07, MRN [id number]  PCP:  Elby Showers, MD  Cardiologist:   No primary care provider on file.   Chief Complaint  Patient presents with  . Palpitations      History of Present Illness: Katie Allen is a 69 y.o. female who presents for followup of dyspnea.  I have seen her before for palpitatons. She has a family history of sudden cardiac death of her brother who had an ICD.  Of note today she tells me a story that makes it sound like he might of had a myomectomy which she still does not have any details about this and does not know whether he has had any genetic testing.   She did wear a monitor in the past and had sinus tachycardia. There were occasional PVCs.   She has been started on a beta blocker which we have been able to titrate. I did send her for a treadmill test and it was stopped secondary to dyspnea. There were PVCs during exercise and recovery but there were no sustained runs.   This was an adequate test and she had no ischemia.  An echo was done.  It demonstrated a normal EF and no significant abnormalities.     Since I last saw her she has done okay.  She retired from The patient denies any new symptoms such as chest discomfort, neck or arm discomfort. There has been no new shortness of breath, PND or orthopnea. There have been no reported palpitations, presyncope or syncope.     Past Medical History:  Diagnosis Date  . Hashimoto's thyroiditis   . Heart palpitations   . Hyperlipidemia    x years  . Hypertension    x years  . Shingles   . Squamous carcinoma    L arm   . TIA (transient ischemic attack)     Past Surgical History:  Procedure Laterality Date  . CHOLECYSTECTOMY       Current Outpatient Medications  Medication Sig Dispense Refill  . Ascorbic Acid (VITAMIN C) 500 MG tablet Take 500 mg by mouth daily.      Marland Kitchen atorvastatin (LIPITOR) 40 MG tablet TAKE 1 TABLET (40 MG  TOTAL) BY MOUTH DAILY. 90 tablet 3  . b complex vitamins tablet Take 1 tablet by mouth daily.      . Cholecalciferol (PA VITAMIN D-3) 2000 UNITS CAPS Take by mouth.      . clopidogrel (PLAVIX) 75 MG tablet TAKE 1 TABLET BY MOUTH WITH BREAKFAST 90 tablet 3  . DENTAGEL 1.1 % GEL dental gel     . esomeprazole (NEXIUM) 40 MG capsule Take 40 mg by mouth daily.    Marland Kitchen levothyroxine (SYNTHROID) 50 MCG tablet TAKE 1/2 TABLET BY MOUTH EVERY OTHER DAY, AND 1 TABLET ON ALTERNATING DAYS 70 tablet 1  . lisinopril-hydrochlorothiazide (ZESTORETIC) 20-25 MG tablet TAKE 1 TABLET BY MOUTH EVERY DAY 90 tablet 1  . metoprolol tartrate (LOPRESSOR) 25 MG tablet TAKE 1.5 TABLETS (37.5 MG TOTAL) BY MOUTH 2 (TWO) TIMES DAILY. 270 tablet 3  . Multiple Vitamin (MULTIVITAMIN) tablet Take 1 tablet by mouth daily.      . potassium chloride (K-DUR) 10 MEQ tablet TAKE 1 TABLET BY MOUTH EVERY DAY 90 tablet 3   No current facility-administered medications for this visit.    Allergies:   Penicillins    ROS:  Please see the history of present illness.  Otherwise, review of systems are positive for none.   All other systems are reviewed and negative.    PHYSICAL EXAM: VS:  BP 124/72   Pulse 65   Temp (!) 97.5 F (36.4 C)   Ht 5\' 11"  (1.803 m)   Wt 248 lb 9.6 oz (112.8 kg)   SpO2 97%   BMI 34.67 kg/m  , BMI Body mass index is 34.67 kg/m. GENERAL:  Well appearing NECK:  No jugular venous distention, waveform within normal limits, carotid upstroke brisk and symmetric, no bruits, no thyromegaly LUNGS:  Clear to auscultation bilaterally CHEST:  Unremarkable HEART:  PMI not displaced or sustained,S1 and S2 within normal limits, no S3, no S4, no clicks, no rubs, no murmurs ABD:  Flat, positive bowel sounds normal in frequency in pitch, no bruits, no rebound, no guarding, no midline pulsatile mass, no hepatomegaly, no splenomegaly EXT:  2 plus pulses throughout, no edema, no cyanosis no clubbing   EKG:  EKG is ordered  today. The ekg ordered 02/15/2019 demonstrates normal sinus rhythm, rate 65, axis within normal limits, intervals within normal limits, no acute ST-T wave changes.   Recent Labs: 02/28/2018: BUN 17; Creat 0.59; Hemoglobin 14.7; Platelets 269; Potassium 4.3; Sodium 144; TSH 0.96 08/29/2018: ALT 19    Lipid Panel    Component Value Date/Time   CHOL 141 08/29/2018 0904   CHOL 161 05/12/2012 0829   TRIG 180 (H) 08/29/2018 0904   TRIG 271 (H) 12/04/2012 1506   TRIG 173 (H) 05/12/2012 0829   HDL 38 (L) 08/29/2018 0904   HDL 41 12/04/2012 1506   HDL 43 05/12/2012 0829   CHOLHDL 3.7 08/29/2018 0904   VLDL 35 (H) 06/07/2016 0950   LDLCALC 75 08/29/2018 0904   LDLCALC 95 12/04/2012 1506   LDLCALC 83 05/12/2012 0829      Wt Readings from Last 3 Encounters:  02/15/19 248 lb 9.6 oz (112.8 kg)  08/31/18 247 lb (112 kg)  03/02/18 248 lb (112.5 kg)      Other studies Reviewed: Additional studies/ records that were reviewed today include: Labs. Review of the above records demonstrates:  Please see elsewhere in the note.     ASSESSMENT AND PLAN:   PALPITATIONS-  she is not having any significant palpitations.  No change in therapy.   HTN -  The blood pressure is controlled.  No change in therapy.   OVERWEIGHT:    We talked about exercise.  No change in therapy.   FH of SUDDEN CARDIAC DEATH/ICD:   She still has never talked to her brother about his diagnosis or any genetic testing.  I will be repeating echo next year to screen for what might be a family history of cardiomyopathy.  CAROTID STENOSIS:  This was very mild on Doppler a couple of years ago.  No change in therapy or further imaging.   COVID EDUCATION: She is anxious to get the vaccine when able.  Talked about this.   Current medicines are reviewed at length with the patient today.  The patient does not have concerns regarding medicines.  The following changes have been made:  no change  Labs/ tests ordered today  include: None  Orders Placed This Encounter  Procedures  . EKG 12-Lead  . ECHOCARDIOGRAM COMPLETE     Disposition:   FU with me in one year.     Signed, Minus Breeding, MD  02/15/2019 9:55 AM    [facility]

## 2019-02-15 ENCOUNTER — Other Ambulatory Visit: Payer: Self-pay

## 2019-02-15 ENCOUNTER — Encounter: Payer: Self-pay | Admitting: Cardiology

## 2019-02-15 ENCOUNTER — Ambulatory Visit (INDEPENDENT_AMBULATORY_CARE_PROVIDER_SITE_OTHER): Payer: Medicare Other | Admitting: Cardiology

## 2019-02-15 VITALS — BP 124/72 | HR 65 | Temp 97.5°F | Ht 71.0 in | Wt 248.6 lb

## 2019-02-15 DIAGNOSIS — E663 Overweight: Secondary | ICD-10-CM

## 2019-02-15 DIAGNOSIS — Z7189 Other specified counseling: Secondary | ICD-10-CM | POA: Diagnosis not present

## 2019-02-15 DIAGNOSIS — Z8489 Family history of other specified conditions: Secondary | ICD-10-CM

## 2019-02-15 DIAGNOSIS — Z8249 Family history of ischemic heart disease and other diseases of the circulatory system: Secondary | ICD-10-CM

## 2019-02-15 DIAGNOSIS — R002 Palpitations: Secondary | ICD-10-CM | POA: Diagnosis not present

## 2019-02-15 DIAGNOSIS — I10 Essential (primary) hypertension: Secondary | ICD-10-CM | POA: Diagnosis not present

## 2019-02-15 NOTE — Patient Instructions (Addendum)
Medication Instructions:  No changes *If you need a refill on your cardiac medications before your next appointment, please call your pharmacy*  Lab Work: None ordered  Testing/Procedures: Dr. Percival Spanish has requested that you have an echocardiogram prior to your follow up appointment. Echocardiography is a painless test that uses sound waves to create images of your heart. It provides your doctor with information about the size and shape of your heart and how well your heart's chambers and valves are working. This procedure takes approximately one hour. There are no restrictions for this procedure.    Follow-Up: At [facility], you and your health needs are our priority.  As part of our continuing mission to provide you with exceptional heart care, we have created designated Provider Care Teams.  These Care Teams include your primary Cardiologist (physician) and Advanced Practice Providers (APPs -  Physician Assistants and Nurse Practitioners) who all work together to provide you with the care you need, when you need it.  Your next appointment:   1 year(s)  The format for your next appointment:   In Person  Provider:   Minus Breeding, MD

## 2019-03-30 ENCOUNTER — Other Ambulatory Visit: Payer: Self-pay | Admitting: Internal Medicine

## 2019-03-30 DIAGNOSIS — E063 Autoimmune thyroiditis: Secondary | ICD-10-CM

## 2019-04-05 ENCOUNTER — Other Ambulatory Visit: Payer: Self-pay

## 2019-04-05 ENCOUNTER — Other Ambulatory Visit: Payer: Medicare Other | Admitting: Internal Medicine

## 2019-04-05 DIAGNOSIS — Z87898 Personal history of other specified conditions: Secondary | ICD-10-CM | POA: Diagnosis not present

## 2019-04-05 DIAGNOSIS — E663 Overweight: Secondary | ICD-10-CM | POA: Diagnosis not present

## 2019-04-05 DIAGNOSIS — Z0000 Encounter for general adult medical examination without abnormal findings: Secondary | ICD-10-CM

## 2019-04-05 DIAGNOSIS — Z8673 Personal history of transient ischemic attack (TIA), and cerebral infarction without residual deficits: Secondary | ICD-10-CM

## 2019-04-05 DIAGNOSIS — E782 Mixed hyperlipidemia: Secondary | ICD-10-CM

## 2019-04-05 DIAGNOSIS — Z8249 Family history of ischemic heart disease and other diseases of the circulatory system: Secondary | ICD-10-CM | POA: Diagnosis not present

## 2019-04-05 DIAGNOSIS — I10 Essential (primary) hypertension: Secondary | ICD-10-CM | POA: Diagnosis not present

## 2019-04-05 DIAGNOSIS — E039 Hypothyroidism, unspecified: Secondary | ICD-10-CM | POA: Diagnosis not present

## 2019-04-05 DIAGNOSIS — R7302 Impaired glucose tolerance (oral): Secondary | ICD-10-CM | POA: Diagnosis not present

## 2019-04-06 LAB — LIPID PANEL
Cholesterol: 129 mg/dL (ref <200)
HDL: 40 mg/dL — ABNORMAL LOW (ref >=50)
LDL Cholesterol (Calc): 62 mg/dL (calc)
Non-HDL Cholesterol (Calc): 89 mg/dL (calc) (ref <130)
Total CHOL/HDL Ratio: 3.2 (calc) (ref <5.0)
Triglycerides: 198 mg/dL — ABNORMAL HIGH (ref <150)

## 2019-04-06 LAB — COMPLETE METABOLIC PANEL WITH GFR
AG Ratio: 2.5 (calc) (ref 1.0–2.5)
ALT: 19 U/L (ref 6–29)
AST: 14 U/L (ref 10–35)
Albumin: 4.3 g/dL (ref 3.6–5.1)
Alkaline phosphatase (APISO): 77 U/L (ref 37–153)
BUN: 16 mg/dL (ref 7–25)
CO2: 29 mmol/L (ref 20–32)
Calcium: 9.6 mg/dL (ref 8.6–10.4)
Chloride: 105 mmol/L (ref 98–110)
Creat: 0.66 mg/dL (ref 0.50–0.99)
GFR, Est African American: 106 mL/min/{1.73_m2} (ref >=60)
GFR, Est Non African American: 91 mL/min/{1.73_m2} (ref >=60)
Globulin: 1.7 g/dL (calc) — ABNORMAL LOW (ref 1.9–3.7)
Glucose, Bld: 106 mg/dL — ABNORMAL HIGH (ref 65–99)
Potassium: 3.9 mmol/L (ref 3.5–5.3)
Sodium: 143 mmol/L (ref 135–146)
Total Bilirubin: 0.7 mg/dL (ref 0.2–1.2)
Total Protein: 6 g/dL — ABNORMAL LOW (ref 6.1–8.1)

## 2019-04-06 LAB — CBC WITH DIFFERENTIAL/PLATELET
Absolute Monocytes: 377 cells/uL (ref 200–950)
Basophils Absolute: 29 cells/uL (ref 0–200)
Basophils Relative: 0.5 %
Eosinophils Absolute: 128 cells/uL (ref 15–500)
Eosinophils Relative: 2.2 %
HCT: 43.2 % (ref 35.0–45.0)
Hemoglobin: 14.5 g/dL (ref 11.7–15.5)
Lymphs Abs: 2598 cells/uL (ref 850–3900)
MCH: 29.4 pg (ref 27.0–33.0)
MCHC: 33.6 g/dL (ref 32.0–36.0)
MCV: 87.4 fL (ref 80.0–100.0)
MPV: 10.8 fL (ref 7.5–12.5)
Monocytes Relative: 6.5 %
Neutro Abs: 2668 cells/uL (ref 1500–7800)
Neutrophils Relative %: 46 %
Platelets: 264 10*3/uL (ref 140–400)
RBC: 4.94 10*6/uL (ref 3.80–5.10)
RDW: 13 % (ref 11.0–15.0)
Total Lymphocyte: 44.8 %
WBC: 5.8 10*3/uL (ref 3.8–10.8)

## 2019-04-06 LAB — HEMOGLOBIN A1C
Hgb A1c MFr Bld: 6 % of total Hgb — ABNORMAL HIGH (ref <5.7)
Mean Plasma Glucose: 126 (calc)
eAG (mmol/L): 7 (calc)

## 2019-04-06 LAB — TSH: TSH: 1.03 mIU/L (ref 0.40–4.50)

## 2019-04-12 ENCOUNTER — Encounter: Payer: Self-pay | Admitting: Internal Medicine

## 2019-04-12 ENCOUNTER — Other Ambulatory Visit: Payer: Self-pay

## 2019-04-12 ENCOUNTER — Ambulatory Visit (INDEPENDENT_AMBULATORY_CARE_PROVIDER_SITE_OTHER): Payer: Medicare Other | Admitting: Internal Medicine

## 2019-04-12 VITALS — BP 110/70 | HR 72 | Ht 71.0 in | Wt 247.0 lb

## 2019-04-12 DIAGNOSIS — I10 Essential (primary) hypertension: Secondary | ICD-10-CM

## 2019-04-12 DIAGNOSIS — R7302 Impaired glucose tolerance (oral): Secondary | ICD-10-CM

## 2019-04-12 DIAGNOSIS — Z87898 Personal history of other specified conditions: Secondary | ICD-10-CM

## 2019-04-12 DIAGNOSIS — Z8673 Personal history of transient ischemic attack (TIA), and cerebral infarction without residual deficits: Secondary | ICD-10-CM

## 2019-04-12 DIAGNOSIS — Z6834 Body mass index (BMI) 34.0-34.9, adult: Secondary | ICD-10-CM

## 2019-04-12 DIAGNOSIS — Z0000 Encounter for general adult medical examination without abnormal findings: Secondary | ICD-10-CM | POA: Diagnosis not present

## 2019-04-12 DIAGNOSIS — E782 Mixed hyperlipidemia: Secondary | ICD-10-CM

## 2019-04-12 DIAGNOSIS — E039 Hypothyroidism, unspecified: Secondary | ICD-10-CM

## 2019-04-12 LAB — POCT URINALYSIS DIPSTICK
Appearance: NEGATIVE
Bilirubin, UA: NEGATIVE
Blood, UA: NEGATIVE
Glucose, UA: NEGATIVE
Ketones, UA: NEGATIVE
Leukocytes, UA: NEGATIVE
Nitrite, UA: NEGATIVE
Odor: NEGATIVE
Protein, UA: NEGATIVE
Spec Grav, UA: 1.01 (ref 1.010–1.025)
Urobilinogen, UA: 0.2 E.U./dL
pH, UA: 6.5 (ref 5.0–8.0)

## 2019-04-12 NOTE — Progress Notes (Signed)
Subjective:    Patient ID: Katie Allen, female    DOB: 01-10-1951, 69 y.o.   MRN: [id number]  HPI 69 year old Female for health maintenance exam, Medicare wellness, and evaluation of medical issues. Saw Dr. Percival Spanish January 2021 and was doing well.  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  History of squamous cell carcinoma left arm 2018.  2 retinal hemorrhages resulting in floaters and is seeing Dr. Zadie Rhine in the past.  Diagnosed with lichen planus and is followed at [facility].  History of GE reflux, hypothyroidism, hyperlipidemia, hypertension.  She is maintained on Plavix.  She had a TIA July 2013 on estrogen replacement.  She complained of numbness in arm and leg for 2 weeks.  MRI was unremarkable except for mild small vessel disease.  She is on generic pravastatin for hyperlipidemia.  History of impaired glucose tolerance.  Takes omeprazole for GE reflux.  Has seen Dr. Cristina Gong in the past.  History of dependent edema.  In February 2019 was screened for abdominal aortic aneurysm due to family history and there was no abdominal aortic aneurysm present.  History of PVCs and bigeminy.  History of palpitations.  Holter monitor September 2015 showed sinus tachycardia with a rate of 126 bpm and occasional PVCs.  She is on metoprolol.  She had a treadmill test which was stopped due to dyspnea showing PVCs during exercise and recovery but no sustained runs.  Codeine makes her nauseated.  She is allergic penicillin-causes swollen tongue.  A cholecystectomy 2010.  History of mild carotid stenosis 1 to 39% followed by Dr. Percival Spanish.  Social history: She is married.  Husband is issues with heart failure.  She completed high school.  1 adult son.  Lives in [place].  Does not smoke or consume alcohol.  Family history: Father died at age 41 with an MI.  History of stroke and history of abdominal aortic aneurysm.  Mother died at age 68 of colon  cancer.  Patient had a twin brother with history of pacemaker and defibrillator with history of atrial fibrillation apparently died of sudden cardiac death.  Another brother in his 22s in good health with history of hypertension.  Another brother in good health in his 79s.  1 sister in good health.  Had colonoscopy in 2019  Triglycerides are elevated at 198 and previously were 180 in July 2020.  TSH is normal.  Hemoglobin A1c 6%.  Fasting glucose is 106  Review of Systems doing well during the pandemic.  No new complaints.     Objective:   Physical Exam Blood pressure 110/70, pulse 72 pulse oximetry 96% weight 247 pounds BMI 34.45  Skin warm and dry.  Nodes none.  TMs are clear.  Neck supple.  Chest clear to auscultation.  Cardiac exam regular rate and rhythm normal S1 and S2.  Breast without masses.  Abdomen soft nondistended without hepatosplenomegaly masses or tenderness.  No lower extremity pitting edema.  No focal deficits on brief neurological exam.  Affect judgment and thought process normal. Bimanual normal.  Pap not done due to age      Assessment & Plan:  Impaired glucose tolerance-stable with diet continue diet exercise and weight loss  GE reflux treated with Nexium  Hypothyroidism-stable on levothyroxine 50 mcg she takes 1/2 tablet every other day and 1 tablet every other day  History of palpitations treated with metoprolol  History of hypokalemia treated with K. Dur-this is due to diuretic  therapy  Essential hypertension treated with Zestoretic and stable  History of TIA treated with Plavix  Mixed hyperlipidemia treated with Lipitor  BMI 34.45-continue diet exercise and weight loss efforts  Plan: Return in 6 months.  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  Subjective:   Patient presents for Medicare Annual/Subsequent preventive examination.  Review Past Medical/Family/Social: See  above   Risk Factors  Current exercise habits: Not a lot of exercise Dietary issues discussed: Low-fat low carbohydrate  Cardiac risk factors: Family history, hyperlipidemia, impaired glucose tolerance  Depression Screen  (Note: if answer to either of the following is "Yes", a more complete depression screening is indicated)   Over the past two weeks, have you felt down, depressed or hopeless? No  Over the past two weeks, have you felt little interest or pleasure in doing things? No Have you lost interest or pleasure in daily life? No Do you often feel hopeless? No Do you cry easily over simple problems? No   Activities of Daily Living  In your present state of health, do you have any difficulty performing the following activities?:   Driving? No  Managing money? No  Feeding yourself? No  Getting from bed to chair? No  Climbing a flight of stairs? No  Preparing food and eating?: No  Bathing or showering? No  Getting dressed: No  Getting to the toilet? No  Using the toilet:No  Moving around from place to place: No  In the past year have you fallen or had a near fall?:No  Are you sexually active? No  Do you have more than one partner? No   Hearing Difficulties: No  Do you often ask people to speak up or repeat themselves? No  Do you experience ringing or noises in your ears? No  Do you have difficulty understanding soft or whispered voices? No  Do you feel that you have a problem with memory? No Do you often misplace items? No    Home Safety:  Do you have a smoke alarm at your residence? Yes Do you have grab bars in the bathroom?  None Do you have throw rugs in your house?  Yes   Cognitive Testing  Alert? Yes Normal Appearance?Yes  Oriented to person? Yes Place? Yes  Time? Yes  Recall of three objects? Yes  Can perform simple calculations? Yes  Displays appropriate judgment?Yes  Can read the correct time from a watch face?Yes   List the Names of Other  Physician/Practitioners you currently use:  See referral list for the physicians patient is currently seeing.  Dr. Percival Spanish   Review of Systems: See above   Objective:     General appearance: Appears stated age and  obese  Head: Normocephalic, without obvious abnormality, atraumatic  Eyes: conj clear, EOMi PEERLA  Ears: normal TM's and external ear canals both ears  Nose: Nares normal. Septum midline. Mucosa normal. No drainage or sinus tenderness.  Throat: lips, mucosa, and tongue normal; teeth and gums normal  Neck: no adenopathy, no carotid bruit, no JVD, supple, symmetrical, trachea midline and thyroid not enlarged, symmetric, no tenderness/mass/nodules  No CVA tenderness.  Lungs: clear to auscultation bilaterally  Breasts: normal appearance, no masses or tenderness Heart: regular rate and rhythm, S1, S2 normal, no murmur, click, rub or gallop  Abdomen: soft, non-tender; bowel sounds normal; no masses, no organomegaly  Musculoskeletal: ROM normal in all joints, no crepitus, no deformity, Normal muscle strengthen.  Back  is symmetric, no curvature. Skin: Skin color, texture, turgor normal. No rashes or lesions  Lymph nodes: Cervical, supraclavicular, and axillary nodes normal.  Neurologic: CN 2 -12 Normal, Normal symmetric reflexes. Normal coordination and gait  Psych: Alert & Oriented x 3, Mood appear stable.    Assessment:    Annual wellness medicare exam   Plan:    During the course of the visit the patient was educated and counseled about appropriate screening and preventive services including:    Annual flu vaccine  Has not had Covid vaccine yet  Patient Instructions (the written plan) was given to the patient.  Medicare Attestation  I have personally reviewed:  The patient's medical and social history  Their use of alcohol, tobacco or illicit drugs  Their current medications and supplements  The patient's functional ability including ADLs,fall risks, home  safety risks, cognitive, and hearing and visual impairment  Diet and physical activities  Evidence for depression or mood disorders  The patient's weight, height, BMI, and visual acuity have been recorded in the chart. I have made referrals, counseling, and provided education to the patient based on review of the above and I have provided the patient with a written personalized care plan for preventive services.

## 2019-05-06 ENCOUNTER — Encounter: Payer: Self-pay | Admitting: Internal Medicine

## 2019-05-06 NOTE — Patient Instructions (Addendum)
It was a pleasure to see you today.  Continue current medications and follow-up in 6 months.  Please try to find time to walk some for exercise.

## 2019-06-29 DIAGNOSIS — Z03818 Encounter for observation for suspected exposure to other biological agents ruled out: Secondary | ICD-10-CM | POA: Diagnosis not present

## 2019-06-29 DIAGNOSIS — Z20828 Contact with and (suspected) exposure to other viral communicable diseases: Secondary | ICD-10-CM | POA: Diagnosis not present

## 2019-07-21 ENCOUNTER — Other Ambulatory Visit: Payer: Self-pay | Admitting: Internal Medicine

## 2019-09-05 DIAGNOSIS — L723 Sebaceous cyst: Secondary | ICD-10-CM | POA: Diagnosis not present

## 2019-09-05 DIAGNOSIS — Z85828 Personal history of other malignant neoplasm of skin: Secondary | ICD-10-CM | POA: Diagnosis not present

## 2019-09-05 DIAGNOSIS — L821 Other seborrheic keratosis: Secondary | ICD-10-CM | POA: Diagnosis not present

## 2019-09-05 DIAGNOSIS — L578 Other skin changes due to chronic exposure to nonionizing radiation: Secondary | ICD-10-CM | POA: Diagnosis not present

## 2019-09-05 DIAGNOSIS — D225 Melanocytic nevi of trunk: Secondary | ICD-10-CM | POA: Diagnosis not present

## 2019-09-05 DIAGNOSIS — L814 Other melanin hyperpigmentation: Secondary | ICD-10-CM | POA: Diagnosis not present

## 2019-09-12 ENCOUNTER — Other Ambulatory Visit: Payer: Self-pay | Admitting: Internal Medicine

## 2019-09-12 DIAGNOSIS — E063 Autoimmune thyroiditis: Secondary | ICD-10-CM

## 2019-10-11 ENCOUNTER — Other Ambulatory Visit: Payer: Self-pay

## 2019-10-11 ENCOUNTER — Other Ambulatory Visit: Payer: Medicare Other | Admitting: Internal Medicine

## 2019-10-11 DIAGNOSIS — R7302 Impaired glucose tolerance (oral): Secondary | ICD-10-CM | POA: Diagnosis not present

## 2019-10-11 DIAGNOSIS — E782 Mixed hyperlipidemia: Secondary | ICD-10-CM | POA: Diagnosis not present

## 2019-10-12 LAB — LIPID PANEL
Cholesterol: 129 mg/dL (ref <200)
HDL: 40 mg/dL — ABNORMAL LOW (ref >=50)
LDL Cholesterol (Calc): 65 mg/dL (calc)
Non-HDL Cholesterol (Calc): 89 mg/dL (calc) (ref <130)
Total CHOL/HDL Ratio: 3.2 (calc) (ref <5.0)
Triglycerides: 163 mg/dL — ABNORMAL HIGH (ref <150)

## 2019-10-12 LAB — HEMOGLOBIN A1C
Hgb A1c MFr Bld: 5.8 % of total Hgb — ABNORMAL HIGH (ref <5.7)
Mean Plasma Glucose: 120 (calc)
eAG (mmol/L): 6.6 (calc)

## 2019-10-12 LAB — HEPATIC FUNCTION PANEL
AG Ratio: 1.9 (calc) (ref 1.0–2.5)
ALT: 22 U/L (ref 6–29)
AST: 17 U/L (ref 10–35)
Albumin: 4.3 g/dL (ref 3.6–5.1)
Alkaline phosphatase (APISO): 74 U/L (ref 37–153)
Bilirubin, Direct: 0.2 mg/dL (ref 0.0–0.2)
Globulin: 2.3 g/dL (calc) (ref 1.9–3.7)
Indirect Bilirubin: 0.6 mg/dL (calc) (ref 0.2–1.2)
Total Bilirubin: 0.8 mg/dL (ref 0.2–1.2)
Total Protein: 6.6 g/dL (ref 6.1–8.1)

## 2019-10-18 ENCOUNTER — Ambulatory Visit (INDEPENDENT_AMBULATORY_CARE_PROVIDER_SITE_OTHER): Payer: Medicare Other | Admitting: Internal Medicine

## 2019-10-18 ENCOUNTER — Encounter: Payer: Self-pay | Admitting: Internal Medicine

## 2019-10-18 ENCOUNTER — Other Ambulatory Visit: Payer: Self-pay

## 2019-10-18 VITALS — BP 100/80 | HR 67 | Ht 71.0 in | Wt 248.0 lb

## 2019-10-18 DIAGNOSIS — Z87898 Personal history of other specified conditions: Secondary | ICD-10-CM | POA: Diagnosis not present

## 2019-10-18 DIAGNOSIS — L439 Lichen planus, unspecified: Secondary | ICD-10-CM

## 2019-10-18 DIAGNOSIS — E039 Hypothyroidism, unspecified: Secondary | ICD-10-CM | POA: Diagnosis not present

## 2019-10-18 DIAGNOSIS — E782 Mixed hyperlipidemia: Secondary | ICD-10-CM

## 2019-10-18 DIAGNOSIS — I10 Essential (primary) hypertension: Secondary | ICD-10-CM | POA: Diagnosis not present

## 2019-10-18 DIAGNOSIS — Z8673 Personal history of transient ischemic attack (TIA), and cerebral infarction without residual deficits: Secondary | ICD-10-CM

## 2019-10-18 DIAGNOSIS — Z6834 Body mass index (BMI) 34.0-34.9, adult: Secondary | ICD-10-CM

## 2019-10-18 DIAGNOSIS — R7302 Impaired glucose tolerance (oral): Secondary | ICD-10-CM | POA: Diagnosis not present

## 2019-10-18 MED ORDER — CLOPIDOGREL BISULFATE 75 MG PO TABS: ORAL_TABLET | ORAL | 3 refills | Status: DC

## 2019-10-18 MED ORDER — POTASSIUM CHLORIDE ER 10 MEQ PO TBCR: 10.0000 meq | EXTENDED_RELEASE_TABLET | Freq: Every day | ORAL | 3 refills | Status: DC

## 2019-10-18 NOTE — Progress Notes (Signed)
   Subjective:    Patient ID: Katie Allen, female    DOB: [birth year], 69 y.o.   MRN: [id number]  HPI 69 year old Female for 6 month follow up.  She has a history of hypertension, hyperlipidemia, hypothyroidism and obesity.  History of palpitations.  She formerly worked as an Glass blower/designer in transcription as per Dr. 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  Hemoglobin A1c 5.8%.  Liver panel normal.  Lipids are normal with the exception of a low HDL of 40 and triglycerides slightly elevated at 163.  BMI is 34.59.  TSH was not checked with this visit.    Review of Systems see above-no new complaints     Objective:   Physical Exam Blood pressure 100/80 pulse 67 regular pulse oximetry 95% weight 248 pounds Neck is supple without JVD thyromegaly or carotid bruits.  Chest clear to auscultation.  Cardiac exam regular rate and rhythm normal S1 and S2 without murmurs or gallops.  Extremities without edema.  Affect thought and judgment are normal.      Assessment & Plan:  Essential hypertension-stable on Zestoretic and metoprolol  Hypothyroidism-TSH not checked today continue levothyroxine 50 mcg daily  Hyperlipidemia-stable.  Triglycerides slightly elevated at 163.  Continue Lipitor 40 mg daily.  Patient is on Plavix for history of CVA  Takes Nexium for GE reflux  Continue potassium supplement on Zestoretic  History of lichen planus followed by ENT with shallow ulcerations of tongue  History of dependent edema  History of TIA July 2013 while on estrogen replacement.  Complains of numbness in the arm and leg for 2 weeks.  She is now on Plavix.  History of bigeminy and PVCs.  History of palpitations.  This is controlled with metoprolol.  Plan: Continue to work on diet and exercise.  Continue current medications and follow-up in 6 months for health maintenance exam.

## 2019-11-03 NOTE — Patient Instructions (Signed)
It was a pleasure to see you today.  Continue current medications and follow-up with health maintenance exam and labs in 6 months.

## 2019-11-04 ENCOUNTER — Other Ambulatory Visit: Payer: Self-pay | Admitting: Internal Medicine

## 2019-11-26 ENCOUNTER — Other Ambulatory Visit: Payer: Self-pay | Admitting: Internal Medicine

## 2019-11-26 DIAGNOSIS — Z1231 Encounter for screening mammogram for malignant neoplasm of breast: Secondary | ICD-10-CM

## 2019-12-21 ENCOUNTER — Ambulatory Visit: Payer: Medicare Other

## 2019-12-22 ENCOUNTER — Other Ambulatory Visit: Payer: Self-pay | Admitting: Internal Medicine

## 2019-12-25 DIAGNOSIS — L723 Sebaceous cyst: Secondary | ICD-10-CM | POA: Diagnosis not present

## 2020-01-07 DIAGNOSIS — L723 Sebaceous cyst: Secondary | ICD-10-CM | POA: Diagnosis not present

## 2020-01-19 ENCOUNTER — Other Ambulatory Visit: Payer: Self-pay | Admitting: Internal Medicine

## 2020-01-21 ENCOUNTER — Other Ambulatory Visit: Payer: Self-pay

## 2020-01-21 ENCOUNTER — Ambulatory Visit
Admission: RE | Admit: 2020-01-21 | Discharge: 2020-01-21 | Disposition: A | Discharge status: Home / Self Care | Payer: Medicare Other | Source: Ambulatory Visit | Attending: Internal Medicine | Admitting: Internal Medicine

## 2020-01-21 DIAGNOSIS — Z1231 Encounter for screening mammogram for malignant neoplasm of breast: Secondary | ICD-10-CM | POA: Diagnosis not present

## 2020-01-29 ENCOUNTER — Telehealth: Payer: Self-pay | Admitting: Internal Medicine

## 2020-01-29 NOTE — Telephone Encounter (Signed)
Scheduled

## 2020-01-29 NOTE — Telephone Encounter (Signed)
Car visit tomorrow

## 2020-01-29 NOTE — Telephone Encounter (Signed)
Katie Allen [phone]  Glennie called to say for the last 3 days she has been getting a fever around 2 in the afternoon around 100, she has now started with a tickle in her throat and a dry cough, she also has an uncomfortable feeling in left lower quadrant, urinating often. No pain or burning. No COVID exposure. Did self COVID test it was negative. NO COVID Vaccines

## 2020-01-30 ENCOUNTER — Other Ambulatory Visit: Payer: Self-pay

## 2020-01-30 ENCOUNTER — Encounter: Payer: Self-pay | Admitting: Internal Medicine

## 2020-01-30 ENCOUNTER — Ambulatory Visit (INDEPENDENT_AMBULATORY_CARE_PROVIDER_SITE_OTHER): Payer: Medicare Other | Admitting: Internal Medicine

## 2020-01-30 VITALS — HR 88 | Temp 102.1°F

## 2020-01-30 DIAGNOSIS — R0981 Nasal congestion: Secondary | ICD-10-CM

## 2020-01-30 DIAGNOSIS — R059 Cough, unspecified: Secondary | ICD-10-CM | POA: Diagnosis not present

## 2020-01-30 DIAGNOSIS — R509 Fever, unspecified: Secondary | ICD-10-CM | POA: Diagnosis not present

## 2020-01-30 MED ORDER — DOXYCYCLINE HYCLATE 100 MG PO TABS: 100.0000 mg | ORAL_TABLET | Freq: Two times a day (BID) | ORAL | 0 refills | Status: DC

## 2020-01-30 NOTE — Patient Instructions (Addendum)
COVID-19 and respiratory virus panels obtained.  Take doxycycline 100 mg twice daily for 10 days.  Call if symptoms worsen or not improving.  May take Tylenol for fever.  Rest and drink plenty of fluids.  Quarantine until results of COVID-19 test are obtained.

## 2020-01-30 NOTE — Progress Notes (Signed)
   Subjective:    Patient ID: Katie Allen, female    DOB: Dec 14, 1950, 69 y.o.   MRN: [id number]  HPI 69 year old Female seen for nasal congestion and fever. Has not been vaccinated for Covid-19. Brother has Covid but lives at [employer], and she has not been around him. She has some post nasal drainage. Symptoms including fever had onset about 4 days ago.Has been urinating often but no dysuria but some mild left lower quadrant pain. Did one home covid test that was negative.    Review of Systems nausea or vomiting.  No shaking chills.  Sounds nasally congested when she speaks.  No diarrhea.     Objective:   Physical Exam Temperature 102.1 degrees.  Pulse oximetry 97%.  Pulse 88.  TMs are clear.  Chest is clear to auscultation without rales or wheezing.  She is in no acute distress.  Due to COVID-19 pandemic, she was seen outside my office.  She was seen in person.  Due to her fever she was not seen in the office.  No urine specimen was obtained.     Assessment & Plan:  Fever  Nasal congestion-rule out COVID-19 infection  Urinary frequency but no dysuria  Plan: COVID-19 test obtained as well as respiratory virus panel.  Start doxycycline 100 mg twice daily for 10 days.  Rest and drink fluids.

## 2020-02-01 LAB — SARS-COV-2 RNA,(COVID-19) QUALITATIVE NAAT: SARS CoV2 RNA: DETECTED — AB

## 2020-02-01 LAB — RESPIRATORY VIRUS PANEL

## 2020-02-02 ENCOUNTER — Telehealth: Payer: Self-pay | Admitting: Internal Medicine

## 2020-02-02 MED ORDER — METHYLPREDNISOLONE 4 MG PO TABS: ORAL_TABLET | ORAL | 0 refills | Status: DC

## 2020-02-02 MED ORDER — ONDANSETRON HCL 4 MG PO TABS: 4.0000 mg | ORAL_TABLET | Freq: Three times a day (TID) | ORAL | 0 refills | Status: DC | PRN

## 2020-02-02 MED ORDER — ALBUTEROL SULFATE HFA 108 (90 BASE) MCG/ACT IN AERS: 2.0000 | INHALATION_SPRAY | Freq: Four times a day (QID) | RESPIRATORY_TRACT | 2 refills | Status: DC | PRN

## 2020-02-02 MED ORDER — METHYLPREDNISOLONE 4 MG PO TABS: ORAL_TABLET | ORAL | 1 refills | Status: DC

## 2020-02-02 NOTE — Telephone Encounter (Signed)
Just received result of Covid-19 test which is positive. Pt is hoarse, slightly SOB, Temp max 101 degrees. Have called in Medrol for her, Albuterol inhaler and zofran. Have e-mailed infusion center. I will check in with her by phone every few hours. Have her get pulse ox at pharmacy. She is already on doxycycline. MJB, MD.

## 2020-02-03 ENCOUNTER — Telehealth: Payer: Self-pay | Admitting: Unknown Physician Specialty

## 2020-02-03 NOTE — Telephone Encounter (Signed)
Called to discuss with Katie Allen about Covid symptoms and the use of  monoclonal antibody infusion for those with mild to moderate Covid symptoms and at a high risk of hospitalization.     Pt does not qualify for infusion therapy as her symptoms first presented > 7 days prior to timing of infusion tomorrow. Symptoms tier reviewed as well as criteria for ending isolation. Preventative practices reviewed. Patient verbalized understanding  Patient Active Problem List   Diagnosis Date Noted  . Overweight 02/13/2019  . Educated about COVID-19 virus infection 02/13/2019  . Family history of sudden death 24-Dec-2017  . Palpitations 12-24-2017  . Gastroesophageal reflux disease without esophagitis 02/18/2017  . Muscle tension dysphonia 02/18/2017  . Tongue lesion 02/18/2017  . TIA (transient ischemic attack) 09/06/2011  . Hypertension 05/06/2010  . BPPV (benign paroxysmal positional vertigo) 05/06/2010  . Hyperlipemia 05/06/2010  . Hashimoto's disease 05/06/2010  . Hypothyroid 05/06/2010  . Vasomotor flushing 05/06/2010  . OBESITY, UNSPECIFIED 09/17/2009  . PREMATURE VENTRICULAR CONTRACTIONS 09/17/2009  . DYSPNEA 09/17/2009

## 2020-02-05 ENCOUNTER — Telehealth: Payer: Self-pay | Admitting: Internal Medicine

## 2020-02-05 ENCOUNTER — Encounter: Payer: Self-pay | Admitting: Internal Medicine

## 2020-02-05 MED ORDER — FLUCONAZOLE 150 MG PO TABS: 150.0000 mg | ORAL_TABLET | Freq: Once | ORAL | 1 refills | Status: AC

## 2020-02-05 NOTE — Telephone Encounter (Signed)
Phone call to patient this morning.  Checking on her as she has been diagnosed with COVID-19 and is recuperating at home.  Did not qualify for monoclonal antibody infusion.  She is having persistent fatigue and weakness.  Pulse oximetry is around 96% and stable.  Denies significant shortness of breath.  Appetite is decreased.  We discussed things that she can eat such as soup.  She is trying to stay well-hydrated.  She is drinking boost.  She does not feel like she needs a chest x-ray because cough is not much of an issue and her pulse oximetry is stable.  Patient is at home and I am in my office.

## 2020-02-05 NOTE — Telephone Encounter (Signed)
Patient has called back she has a thick white coat on her tongue and feels like her throat is closing up.

## 2020-02-05 NOTE — Telephone Encounter (Signed)
Patient now has coated tongue. Feels it could be thrush. Has had it before. No difficulty breathing. Call in Diflucan 150 mg tab and repeat dose in 3-5 days

## 2020-02-05 NOTE — Telephone Encounter (Signed)
Called patient to check in on her. Not much appetite. Very weak. Urinating 2-3 times a day. Trying to drink Boost for nutrition and hydration. Ate some applesauce. Has chemical smell today. Had dysgeusia yesterday. Has dry throat today which is new.Pulse ox is stable. She will call me if worse and I will continue to call her daily.  She is disappointed as I am that she did not qualify for the infusion. Hopefully can avoid hospitalization.

## 2020-02-06 ENCOUNTER — Telehealth: Payer: Self-pay | Admitting: Internal Medicine

## 2020-02-06 ENCOUNTER — Encounter: Payer: Self-pay | Admitting: Internal Medicine

## 2020-02-06 NOTE — Telephone Encounter (Signed)
Called patient to follow up on her condition. She is beginning to feel some better but has thrush and difficulty swallowing. Able to drink fluids. Hard to eat eggs today- did not want to go down easily. Husband likely has Covid as well but will not take test. Continue to rest and take it easy rest of week. Call if not continuing to improve or symptoms worsen. MJB

## 2020-02-07 ENCOUNTER — Telehealth: Payer: Self-pay

## 2020-02-07 NOTE — Telephone Encounter (Signed)
Patient called has been sick with COVID, she wanted to know if she could only take prednisone tapering course for 8 days instead of 12 days, I asked Dr. Lenord Fellers and she said it was ok as long as patient is feeling better.

## 2020-02-21 ENCOUNTER — Ambulatory Visit (HOSPITAL_COMMUNITY): Payer: Medicare HMO | Attending: Cardiology

## 2020-02-21 ENCOUNTER — Other Ambulatory Visit: Payer: Self-pay

## 2020-02-21 DIAGNOSIS — Z8249 Family history of ischemic heart disease and other diseases of the circulatory system: Secondary | ICD-10-CM | POA: Insufficient documentation

## 2020-02-21 DIAGNOSIS — E669 Obesity, unspecified: Secondary | ICD-10-CM | POA: Diagnosis not present

## 2020-02-21 DIAGNOSIS — E785 Hyperlipidemia, unspecified: Secondary | ICD-10-CM | POA: Insufficient documentation

## 2020-02-21 DIAGNOSIS — I421 Obstructive hypertrophic cardiomyopathy: Secondary | ICD-10-CM | POA: Insufficient documentation

## 2020-02-21 DIAGNOSIS — R002 Palpitations: Secondary | ICD-10-CM | POA: Insufficient documentation

## 2020-02-21 DIAGNOSIS — R0600 Dyspnea, unspecified: Secondary | ICD-10-CM | POA: Insufficient documentation

## 2020-02-21 DIAGNOSIS — Z8673 Personal history of transient ischemic attack (TIA), and cerebral infarction without residual deficits: Secondary | ICD-10-CM | POA: Diagnosis not present

## 2020-02-21 DIAGNOSIS — I10 Essential (primary) hypertension: Secondary | ICD-10-CM | POA: Diagnosis not present

## 2020-02-21 LAB — ECHOCARDIOGRAM COMPLETE
Area-P 1/2: 2.76 cm2
S' Lateral: 2.5 cm

## 2020-02-24 ENCOUNTER — Other Ambulatory Visit: Payer: Self-pay | Admitting: Internal Medicine

## 2020-02-24 DIAGNOSIS — E063 Autoimmune thyroiditis: Secondary | ICD-10-CM

## 2020-02-27 NOTE — Progress Notes (Signed)
Cardiology Office Note   Date:  02/28/2020   ID:  Katie Allen, DOB April 07, 1950, MRN [id number]  PCP:  Elby Showers, MD  Cardiologist:   No primary care provider on file.   Chief Complaint  Patient presents with  . Palpitations      History of Present Illness: Katie Allen is a 70 y.o. female who presented for followup of dyspnea.  I have seen her before for palpitatons. She has a family history of sudden cardiac death of her brother who had an ICD.   Of note she makes it sound like he might of had a myomectomy which she still does not have any details about this and does not know whether he has had any genetic testing.   She did wear a monitor in the past and had sinus tachycardia. There were occasional PVCs.   She has been started on a beta blocker which we have been able to titrate. I did send her for a treadmill test and it was stopped secondary to dyspnea. There were PVCs during exercise and recovery but there were no sustained runs.   This was an adequate test and she had no ischemia.  An echo was done.  It demonstrated a normal EF and no significant abnormalities.     Since I last saw her she has done well. .  She had an echo in January that was unremarkable.   Did this because we do not know why her brother has an ICD or family history of genetic testing he had an ICD in early age.  She has lost 14 pounds with diet and exercise.  Her breathing is much better.The patient denies any new symptoms such as chest discomfort, neck or arm discomfort. There has been no new shortness of breath, PND or orthopnea. There have been no reported palpitations, presyncope or syncope.   She has no other palpitations.  These are not bothersome.    Past Medical History:  Diagnosis Date  . Hashimoto's thyroiditis   . Heart palpitations   . Hyperlipidemia    x years  . Hypertension    x years  . Shingles   . Squamous carcinoma    L arm   . TIA (transient ischemic attack)     Past  Surgical History:  Procedure Laterality Date  . CHOLECYSTECTOMY       Current Outpatient Medications  Medication Sig Dispense Refill  . Ascorbic Acid (VITAMIN C) 500 MG tablet Take 500 mg by mouth daily.    Marland Kitchen atorvastatin (LIPITOR) 40 MG tablet TAKE 1 TABLET (40 MG TOTAL) BY MOUTH DAILY. 90 tablet 3  . b complex vitamins tablet Take 1 tablet by mouth daily.    . Cholecalciferol 50 MCG (2000 UT) CAPS Take by mouth.    . clopidogrel (PLAVIX) 75 MG tablet TAKE 1 TABLET BY MOUTH WITH BREAKFAST 90 tablet 3  . DENTAGEL 1.1 % GEL dental gel     . levothyroxine (SYNTHROID) 50 MCG tablet TAKE 1/2 TABLET BY MOUTH EVERY OTHER DAY, AND 1 TABLET ON ALTERNATING DAYS 70 tablet 1  . lisinopril-hydrochlorothiazide (ZESTORETIC) 20-25 MG tablet TAKE 1 TABLET BY MOUTH EVERY DAY 90 tablet 1  . metoprolol tartrate (LOPRESSOR) 25 MG tablet TAKE 1.5 TABLETS (37.5 MG TOTAL) BY MOUTH 2 (TWO) TIMES DAILY. 270 tablet 3  . Multiple Vitamin (MULTIVITAMIN) tablet Take 1 tablet by mouth daily.    Marland Kitchen OMEPRAZOLE PO Take by mouth.    . potassium chloride (KLOR-CON)  10 MEQ tablet Take 1 tablet (10 mEq total) by mouth daily. 90 tablet 3   No current facility-administered medications for this visit.    Allergies:   Penicillins    ROS:  Please see the history of present illness.   Otherwise, review of systems are positive for none.   All other systems are reviewed and negative.    PHYSICAL EXAM: VS:  BP 135/71   Pulse 71   Ht 5\' 11"  (1.803 m)   Wt 239 lb (108.4 kg)   SpO2 95%   BMI 33.33 kg/m  , BMI Body mass index is 33.33 kg/m. GENERAL:  Well appearing NECK:  No jugular venous distention, waveform within normal limits, carotid upstroke brisk and symmetric, no bruits, no thyromegaly LUNGS:  Clear to auscultation bilaterally CHEST:  Unremarkable HEART:  PMI not displaced or sustained,S1 and S2 within normal limits, no S3, no S4, no clicks, no rubs, no murmurs ABD:  Flat, positive bowel sounds normal in frequency  in pitch, no bruits, no rebound, no guarding, no midline pulsatile mass, no hepatomegaly, no splenomegaly EXT:  2 plus pulses throughout, no edema, no cyanosis no clubbing   EKG:  EKG is  ordered today. The ekg ordered 02/28/2020 demonstrates normal sinus rhythm, rate 71 axis within normal limits, intervals within normal limits, no acute ST-T wave changes.   Recent Labs: 04/05/2019: BUN 16; Creat 0.66; Hemoglobin 14.5; Platelets 264; Potassium 3.9; Sodium 143; TSH 1.03 10/11/2019: ALT 22    Lipid Panel    Component Value Date/Time   CHOL 129 10/11/2019 0911   CHOL 161 05/12/2012 0829   TRIG 163 (H) 10/11/2019 0911   TRIG 271 (H) 12/04/2012 1506   TRIG 173 (H) 05/12/2012 0829   HDL 40 (L) 10/11/2019 0911   HDL 41 12/04/2012 1506   HDL 43 05/12/2012 0829   CHOLHDL 3.2 10/11/2019 0911   VLDL 35 (H) 06/07/2016 0950   LDLCALC 65 10/11/2019 0911   LDLCALC 95 12/04/2012 1506   LDLCALC 83 05/12/2012 0829      Wt Readings from Last 3 Encounters:  02/28/20 239 lb (108.4 kg)  10/18/19 248 lb (112.5 kg)  04/12/19 247 lb (112 kg)      Other studies Reviewed: Additional studies/ records that were reviewed today include: Labs Review of the above records demonstrates:  Please see elsewhere in the note.     ASSESSMENT AND PLAN:   PALPITATIONS-     She is not bothered by these.  No change in therapy.   HTN -  The blood pressure is at target.  No change in therapy.   OVERWEIGHT:      She is losing weight with diet and exercise and I applauded this and encouraged more of the same.   FH of SUDDEN CARDIAC DEATH/ICD:     She has never been able to get a history from her brother but I see no indication on EKG or echo of any high risk features that might indicate a familial disposition.  No further change in therapy or testing is indicated.  CAROTID STENOSIS:  This was very mild on Doppler a couple of years ago.  No change in therapy or further imaging.   COVID EDUCATION:   She has not  yet had the vaccine though she states she did have the virus late last year.  We talked about this at length.   Current medicines are reviewed at length with the patient today.  The patient does not have concerns  regarding medicines.  The following changes have been made: None  Labs/ tests ordered today include: None  Orders Placed This Encounter  Procedures  . EKG 12-Lead     Disposition:   FU with me 1 year   Signed, Minus Breeding, MD  02/28/2020 10:13 AM    [facility]

## 2020-02-28 ENCOUNTER — Ambulatory Visit: Payer: Medicare HMO | Admitting: Cardiology

## 2020-02-28 ENCOUNTER — Other Ambulatory Visit: Payer: Self-pay

## 2020-02-28 ENCOUNTER — Encounter: Payer: Self-pay | Admitting: Cardiology

## 2020-02-28 VITALS — BP 135/71 | HR 71 | Ht 71.0 in | Wt 239.0 lb

## 2020-02-28 DIAGNOSIS — I10 Essential (primary) hypertension: Secondary | ICD-10-CM

## 2020-02-28 DIAGNOSIS — R002 Palpitations: Secondary | ICD-10-CM | POA: Diagnosis not present

## 2020-02-28 DIAGNOSIS — Z8489 Family history of other specified conditions: Secondary | ICD-10-CM | POA: Diagnosis not present

## 2020-02-28 NOTE — Patient Instructions (Signed)

## 2020-04-10 ENCOUNTER — Other Ambulatory Visit: Payer: Medicare Other | Admitting: Internal Medicine

## 2020-04-11 ENCOUNTER — Other Ambulatory Visit: Payer: Self-pay

## 2020-04-11 ENCOUNTER — Other Ambulatory Visit: Payer: Medicare HMO | Admitting: Internal Medicine

## 2020-04-11 DIAGNOSIS — E782 Mixed hyperlipidemia: Secondary | ICD-10-CM

## 2020-04-11 DIAGNOSIS — E039 Hypothyroidism, unspecified: Secondary | ICD-10-CM

## 2020-04-11 DIAGNOSIS — I10 Essential (primary) hypertension: Secondary | ICD-10-CM | POA: Diagnosis not present

## 2020-04-11 DIAGNOSIS — Z87898 Personal history of other specified conditions: Secondary | ICD-10-CM | POA: Diagnosis not present

## 2020-04-11 DIAGNOSIS — Z8673 Personal history of transient ischemic attack (TIA), and cerebral infarction without residual deficits: Secondary | ICD-10-CM

## 2020-04-11 DIAGNOSIS — Z6834 Body mass index (BMI) 34.0-34.9, adult: Secondary | ICD-10-CM

## 2020-04-11 DIAGNOSIS — Z0000 Encounter for general adult medical examination without abnormal findings: Secondary | ICD-10-CM

## 2020-04-11 DIAGNOSIS — R7302 Impaired glucose tolerance (oral): Secondary | ICD-10-CM

## 2020-04-12 LAB — HEMOGLOBIN A1C
Hgb A1c MFr Bld: 5.9 % of total Hgb — ABNORMAL HIGH (ref <5.7)
Mean Plasma Glucose: 123 mg/dL
eAG (mmol/L): 6.8 mmol/L

## 2020-04-12 LAB — COMPLETE METABOLIC PANEL WITH GFR
AG Ratio: 2 (calc) (ref 1.0–2.5)
ALT: 22 U/L (ref 6–29)
AST: 19 U/L (ref 10–35)
Albumin: 4.3 g/dL (ref 3.6–5.1)
Alkaline phosphatase (APISO): 73 U/L (ref 37–153)
BUN: 17 mg/dL (ref 7–25)
CO2: 27 mmol/L (ref 20–32)
Calcium: 10.2 mg/dL (ref 8.6–10.4)
Chloride: 106 mmol/L (ref 98–110)
Creat: 0.67 mg/dL (ref 0.50–0.99)
GFR, Est African American: 105 mL/min/{1.73_m2} (ref >=60)
GFR, Est Non African American: 90 mL/min/{1.73_m2} (ref >=60)
Globulin: 2.1 g/dL (calc) (ref 1.9–3.7)
Glucose, Bld: 91 mg/dL (ref 65–99)
Potassium: 3.9 mmol/L (ref 3.5–5.3)
Sodium: 145 mmol/L (ref 135–146)
Total Bilirubin: 0.8 mg/dL (ref 0.2–1.2)
Total Protein: 6.4 g/dL (ref 6.1–8.1)

## 2020-04-12 LAB — CBC WITH DIFFERENTIAL/PLATELET
Absolute Monocytes: 390 cells/uL (ref 200–950)
Basophils Absolute: 20 cells/uL (ref 0–200)
Basophils Relative: 0.4 %
Eosinophils Absolute: 90 cells/uL (ref 15–500)
Eosinophils Relative: 1.8 %
HCT: 44.1 % (ref 35.0–45.0)
Hemoglobin: 14.9 g/dL (ref 11.7–15.5)
Lymphs Abs: 2545 cells/uL (ref 850–3900)
MCH: 30.2 pg (ref 27.0–33.0)
MCHC: 33.8 g/dL (ref 32.0–36.0)
MCV: 89.3 fL (ref 80.0–100.0)
MPV: 11.1 fL (ref 7.5–12.5)
Monocytes Relative: 7.8 %
Neutro Abs: 1955 cells/uL (ref 1500–7800)
Neutrophils Relative %: 39.1 %
Platelets: 248 10*3/uL (ref 140–400)
RBC: 4.94 10*6/uL (ref 3.80–5.10)
RDW: 13.2 % (ref 11.0–15.0)
Total Lymphocyte: 50.9 %
WBC: 5 10*3/uL (ref 3.8–10.8)

## 2020-04-12 LAB — LIPID PANEL
Cholesterol: 140 mg/dL (ref <200)
HDL: 41 mg/dL — ABNORMAL LOW (ref >=50)
LDL Cholesterol (Calc): 74 mg/dL (calc)
Non-HDL Cholesterol (Calc): 99 mg/dL (calc) (ref <130)
Total CHOL/HDL Ratio: 3.4 (calc) (ref <5.0)
Triglycerides: 169 mg/dL — ABNORMAL HIGH (ref <150)

## 2020-04-12 LAB — TSH: TSH: 1.17 mIU/L (ref 0.40–4.50)

## 2020-04-18 ENCOUNTER — Other Ambulatory Visit: Payer: Self-pay

## 2020-04-18 ENCOUNTER — Ambulatory Visit (INDEPENDENT_AMBULATORY_CARE_PROVIDER_SITE_OTHER): Payer: Medicare HMO | Admitting: Internal Medicine

## 2020-04-18 ENCOUNTER — Encounter: Payer: Self-pay | Admitting: Internal Medicine

## 2020-04-18 VITALS — BP 110/70 | HR 82 | Ht 71.0 in | Wt 236.0 lb

## 2020-04-18 DIAGNOSIS — L439 Lichen planus, unspecified: Secondary | ICD-10-CM

## 2020-04-18 DIAGNOSIS — E782 Mixed hyperlipidemia: Secondary | ICD-10-CM | POA: Diagnosis not present

## 2020-04-18 DIAGNOSIS — L659 Nonscarring hair loss, unspecified: Secondary | ICD-10-CM

## 2020-04-18 DIAGNOSIS — I10 Essential (primary) hypertension: Secondary | ICD-10-CM | POA: Diagnosis not present

## 2020-04-18 DIAGNOSIS — Z87898 Personal history of other specified conditions: Secondary | ICD-10-CM

## 2020-04-18 DIAGNOSIS — Z6832 Body mass index (BMI) 32.0-32.9, adult: Secondary | ICD-10-CM

## 2020-04-18 DIAGNOSIS — Z8616 Personal history of COVID-19: Secondary | ICD-10-CM

## 2020-04-18 DIAGNOSIS — Z0000 Encounter for general adult medical examination without abnormal findings: Secondary | ICD-10-CM | POA: Diagnosis not present

## 2020-04-18 DIAGNOSIS — Z8673 Personal history of transient ischemic attack (TIA), and cerebral infarction without residual deficits: Secondary | ICD-10-CM | POA: Diagnosis not present

## 2020-04-18 DIAGNOSIS — R7302 Impaired glucose tolerance (oral): Secondary | ICD-10-CM

## 2020-04-18 DIAGNOSIS — E039 Hypothyroidism, unspecified: Secondary | ICD-10-CM | POA: Diagnosis not present

## 2020-04-18 DIAGNOSIS — L853 Xerosis cutis: Secondary | ICD-10-CM

## 2020-04-18 LAB — POCT URINALYSIS DIPSTICK
Appearance: NEGATIVE
Bilirubin, UA: NEGATIVE
Blood, UA: NEGATIVE
Glucose, UA: NEGATIVE
Ketones, UA: NEGATIVE
Leukocytes, UA: NEGATIVE
Nitrite, UA: NEGATIVE
Odor: NEGATIVE
Protein, UA: NEGATIVE
Spec Grav, UA: 1.01 (ref 1.010–1.025)
Urobilinogen, UA: 0.2 E.U./dL
pH, UA: 6.5 (ref 5.0–8.0)

## 2020-04-18 NOTE — Progress Notes (Addendum)
Subjective:    Patient ID: Katie Allen, female    DOB: 1950/09/30, 70 y.o.   MRN: [id number]  HPI 70 year old Female seen for Medicare Wellness and health maintenance exam.   She had follow up visit with Dr. Percival Spanish in January 2022 for history of dyspnea and sinus tachycardia with occasional PVCs in the past. She had recent ECHO which was normal. She takes a beta blocker. He thought she was doing OK.  See complete information on this below  Recently, her hair has been falling out quite a bit and her skin is very dry. However, her TSH is normal on Levothyroxine 50 mcg daily.  We spoke about this today, and I will be referring her to Endocrinology. She is concerned this is somehow related to her hx of Hashshimoto's thyroiditis. She had Covid-19  in December, and I wonder if it is related to that illness. She was fairly ill- was unable to get infusion as there was a shortage of MAB medication and missed deadline Staff said by one day over Christmas. She  was treated with steroids and antibiotics as an out-patient with improvement. Has not been vaccinated for Covid-19.  Has had pneumococcal vaccines.  She has a history of GE reflux, hyperlipidemia, hypertension.  She is maintained on Plavix.  She had a TIA in July 2013 on estrogen replacement.  She complained of numbness in arm and leg for 2 weeks.  MRI was unremarkable except for small vessel disease.  She takes generic pravastatin for hyperlipidemia.  She has a history of impaired glucose tolerance.  She takes omeprazole for GE reflux and has seen Dr. Cristina Gong in the past.  History of dependent edema.  In February 2019 we will screen for abdominal aortic aneurysm due to family history but no aneurysm was present.  History of PVCs and bigeminy.  History of palpitations.  Holter monitor September 2015 showed sinus tachycardia with rate of 126 bpm and occasional PVCs.  She had a treadmill test at that time which was stopped due to dyspnea  showing PVCs during exercise and recovery but no sustained runs.  Codeine causes nausea.  She is allergic to penicillin-causes swollen tongue.  Had cholecystectomy in 2010  History of mild carotid stenosis 1 to 39% followed by Dr. Percival Spanish.  Social history: She is married.  She has done well during the pandemic.  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  She resides in [place].  Husband has issues with heart failure.  1 adult son.  Does not smoke or consume alcohol.  Family history: Father died at age 58 with an MI and he had history of stroke and  abdominal aortic aneurysm.  Mother died at age 31 of colon cancer.  Patient had a twin brother who had history of pacemaker and defibrillator with atrial fibrillation apparently had sudden cardiac death. Also, another brother in his 65s in good health with hypertension.  Yet another brother in good health in his 85s.  One sister in good health.  I have reviewed her medications and do not see any that would cause her hair loss to the excess she is having.  Her TSH is excellent at 1.17.  Between 2017 and 2020 her TSH ranged from 0.87 to  0.96.  Last year it was 1.03.   She has been diagnosed with lichen planus and is followed every  3 months by [facility].  She developed shallow ulcerations lateral tongue.  This is not a new diagnosis.  Colonoscopy done 2019 by Dr. Cristina Gong  History of dependent edema.  Review of Systems dry skin and hair loss which are new complaints of the past few weeks     Objective:   Physical Exam Blood pressure 110/70 pulse 84 pulse oximetry 98% weight 236 pounds height 5 feet 11 inches BMI 32.92  Skin: Is dry.  No cervical adenopathy.  No appreciable thyromegaly.  No adenopathy.  Neck is supple.  No carotid bruits.  Chest clear to auscultation.  Cardiac exam: Regular rate  and rhythm without ectopy or murmur appreciated.  Abdomen: Soft nondistended without hepatosplenomegaly masses or tenderness.  No significant lower extremity edema.  Neuro: Intact without focal deficits.  Affect thought and judgment are normal.       Assessment & Plan:  Very pleasant 70 year old Female with new onset of hair loss and dry skin with history of hypothyroidism treated with levothyroxine 50 mcg daily.  GE reflux treated with omeprazole  Hypertension treated with lisinopril HCTZ  History of TIA treated with Plavix  Hyperlipidemia treated with Lipitor  History of COVID-19 infection December 2021  History of sinus tachycardia and PVCs treated with beta-blocker by Dr. Percival Spanish  Family history of colon cancer in mother-patient has been screened  Family history of stroke and abdominal aortic aneurysm in father.  Patient followed by Dr. Percival Spanish.  Plan: Referral to Endocrinologist for evaluation of her issues with dry skin and hair loss.  Perhaps dose of thyroid replacement needs to be adjusted.  She is agreeable to this.  Return here in 6 months for follow-up.  Subjective:   Patient presents for Medicare Annual/Subsequent preventive examination.  Review Past Medical/Family/Social: See above   Risk Factors  Current exercise habits: Light Dietary issues discussed: Yes  Cardiac risk factors: Family history, hyperlipidemia  Depression Screen  (Note: if answer to either of the following is "Yes", a more complete depression screening is indicated)   Over the past two weeks, have you felt down, depressed or hopeless? No  Over the past two weeks, have you felt little interest or pleasure in doing things? No Have you lost interest or pleasure in daily life? No Do you often feel hopeless? No Do you cry easily over simple problems? No   Activities of Daily Living  In your present state of health, do you have any difficulty performing the following activities?:   Driving?  No  Managing money? No  Feeding yourself? No  Getting from bed to chair? No  Climbing a flight of stairs? No  Preparing food and eating?: No  Bathing or showering? No  Getting dressed: No  Getting to the toilet? No  Using the toilet:No  Moving around from place to place: No  In the past year have you fallen or had a near fall?:yes- one  Are you sexually active? No  Do you have more than one partner? No   Hearing Difficulties: No  Do you often ask people to speak up or repeat themselves? No  Do you experience ringing or noises in your ears?  Sometimes Do you have difficulty understanding soft or whispered voices? No  Do you feel that you have a problem with memory? No Do you often misplace items? No    Home Safety:  Do you have a smoke alarm at your residence? Yes Do you have grab bars in the bathroom?  No Do you have throw rugs in your house?  Yes   Cognitive Testing  Alert? Yes Normal Appearance?Yes  Oriented to person? Yes Place? Yes  Time? Yes  Recall of three objects? Yes  Can perform simple calculations? Yes  Displays appropriate judgment?Yes  Can read the correct time from a watch face?Yes   List the Names of Other Physician/Practitioners you currently use:  See referral list for the physicians patient is currently seeing.     Review of Systems: See above   Objective:     General appearance: Pleasant and mildly obese  Head: Normocephalic, without obvious abnormality, atraumatic  Eyes: conj clear, EOMi PEERLA  Ears: normal TM's and external ear canals both ears  Nose: Nares normal. Septum midline. Mucosa normal. No drainage or sinus tenderness.  Throat: lips, mucosa, and tongue normal; teeth and gums normal  Neck: no adenopathy, no carotid bruit, no JVD, supple, symmetrical, trachea midline and thyroid not enlarged, symmetric, no tenderness/mass/nodules  No CVA tenderness.  Lungs: clear to auscultation bilaterally  Breasts: normal appearance, no masses  or tenderness Heart: regular rate and rhythm, S1, S2 normal, no murmur, click, rub or gallop  Abdomen: soft, non-tender; bowel sounds normal; no masses, no organomegaly  Musculoskeletal: ROM normal  no deformity, Normal muscle strengthen. Back  is symmetric, no curvature. Skin: Dry Lymph nodes: Cervical, supraclavicular, and axillary nodes normal.  Neurologic: CN 2 -12 Normal, Normal symmetric reflexes. Normal coordination and gait  Psych: Alert & Oriented x 3, Mood appear stable.    Assessment:    Annual wellness medicare exam   Plan:    During the course of the visit the patient was educated and counseled about appropriate screening and preventive services including:   Has had colonoscopy  Had mammogram in December 2021  Declines vaccine for COVID-19  Had pneumococcal vaccines     Patient Instructions (the written plan) was given to the patient.  Medicare Attestation  I have personally reviewed:  The patient's medical and social history  Their use of alcohol, tobacco or illicit drugs  Their current medications and supplements  The patient's functional ability including ADLs,fall risks, home safety risks, cognitive, and hearing and visual impairment  Diet and physical activities  Evidence for depression or mood disorders  The patient's weight, height, BMI, and visual acuity have been recorded in the chart. I have made referrals, counseling, and provided education to the patient based on review of the above and I have provided the patient with a written personalized care plan for preventive services.

## 2020-04-18 NOTE — Patient Instructions (Addendum)
It was a pleasure to see you today.  We will make referral to endocrinologist for evaluation of dry skin and hair loss.  I am wondering if these issues are is related to COVID-19 and not thyroid disorder.  Continue current medications and follow-up here in 6 months.

## 2020-04-21 DIAGNOSIS — H5210 Myopia, unspecified eye: Secondary | ICD-10-CM | POA: Diagnosis not present

## 2020-04-21 DIAGNOSIS — Z0100 Encounter for examination of eyes and vision without abnormal findings: Secondary | ICD-10-CM | POA: Diagnosis not present

## 2020-04-21 DIAGNOSIS — H2513 Age-related nuclear cataract, bilateral: Secondary | ICD-10-CM | POA: Diagnosis not present

## 2020-04-21 DIAGNOSIS — H43813 Vitreous degeneration, bilateral: Secondary | ICD-10-CM | POA: Diagnosis not present

## 2020-04-30 ENCOUNTER — Telehealth: Payer: Self-pay | Admitting: Internal Medicine

## 2020-04-30 NOTE — Telephone Encounter (Signed)
LVM for patient to [facility] tomorrow about referral to endocrinology one doctor is out of office for month the other denied it because levels are normal so the first possible availability was May. Dr Renold Genta also thinks the hair loss could have something to do with you having COVID in the past.

## 2020-05-01 NOTE — Telephone Encounter (Signed)
Patient called back and I let her know that what was going on with referral and she said she had done some research after Dr Renold Genta had told her that the hair loss could be from where she had COVID and she found that was a side effect, so she said she was going to give it a month or two to see if it clears up on its on. She will let us know if she decides to pursue endocrinology referral any further.  I have let endo know and I have closed the referral for now.

## 2020-05-16 DIAGNOSIS — L650 Telogen effluvium: Secondary | ICD-10-CM | POA: Diagnosis not present

## 2020-05-16 DIAGNOSIS — L219 Seborrheic dermatitis, unspecified: Secondary | ICD-10-CM | POA: Diagnosis not present

## 2020-07-18 ENCOUNTER — Other Ambulatory Visit: Payer: Self-pay | Admitting: Internal Medicine

## 2020-08-04 ENCOUNTER — Telehealth: Payer: Self-pay | Admitting: Cardiology

## 2020-08-04 NOTE — Telephone Encounter (Signed)
Patient c/o Palpitations:  High priority if patient c/o lightheadedness, shortness of breath, or chest pain  How long have you had palpitations/irregular HR/ Afib? Are you having the symptoms now?  Not at this time- Heart skipping off and on all day and night   Are you currently experiencing lightheadedness, SOB or CP? no  Do you have a history of afib (atrial fibrillation) or irregular heart rhythm?   Have you checked your BP or HR? (document readings if available):  normal  Are you experiencing any other symptoms? Real weak and clammy- been worse the last 3 days- pt wants to be seen

## 2020-08-04 NOTE — Telephone Encounter (Signed)
Pt called reporting for the past 3 days she's been consistently experiencing palpitations off and on. She report episodes only last for a few seconds but are more frequent. Pt also report sweating, feeling very weak, and light headed but HR and BP are WNL.   Appointment scheduled for 7/8 for further evaluations. Pt also made aware of ED precaution should any new symptoms develop or worsen.

## 2020-08-14 NOTE — Progress Notes (Signed)
Cardiology Office Note   Date:  08/15/2020   ID:  Katie Allen, DOB 01-09-1951, MRN [id number]  PCP:  Elby Showers, MD  Cardiologist:   None   Chief Complaint  Patient presents with   Palpitations       History of Present Illness: Katie Allen is a 70 y.o. female who presented for followup of dyspnea.  I have seen her before for palpitatons. She has a family history of sudden cardiac death of her brother who had an ICD.   Of note she makes it sound like he might of had a myomectomy which she still does not have any details about this and does not know whether he has had any genetic testing.   She did wear a monitor in the past and had sinus tachycardia. There were occasional PVCs.   She has been started on a beta blocker which we have been able to titrate. I did send her for a treadmill test and it was stopped secondary to dyspnea. There were PVCs during exercise and recovery but there were no sustained runs.   This was an adequate test and she had no ischemia.  An echo was done.  It demonstrated a normal EF and no significant abnormalities.     Since I last saw her she reported palpitations.  She said that she had about 3 days of this late last month.  This was her previous PVCs but she is not more persistent.  She was having them multiple times throughout the day and they would kind of wax and wane in intensity.  She felt fatigued.  She was otherwise feeling okay except she was lightheaded.  She checked her pulse oximeter and thought that it was recording irregular beats more but she could not really tell.  She said eventually after about 3 days it went away and she has not been bothered by it any longer.  She is not getting any chest pressure, neck or arm discomfort.  She has not been having any new PND or orthopnea.   Past Medical History:  Diagnosis Date   Hashimoto's thyroiditis    Heart palpitations    Hyperlipidemia    x years   Hypertension    x years   Shingles     Squamous carcinoma    L arm    TIA (transient ischemic attack)     Past Surgical History:  Procedure Laterality Date   CHOLECYSTECTOMY       Current Outpatient Medications  Medication Sig Dispense Refill   Ascorbic Acid (VITAMIN C) 500 MG tablet Take 500 mg by mouth daily.     atorvastatin (LIPITOR) 40 MG tablet TAKE 1 TABLET (40 MG TOTAL) BY MOUTH DAILY. 90 tablet 3   b complex vitamins tablet Take 1 tablet by mouth daily.     Cholecalciferol 50 MCG (2000 UT) CAPS Take by mouth.     clopidogrel (PLAVIX) 75 MG tablet TAKE 1 TABLET BY MOUTH WITH BREAKFAST 90 tablet 3   DENTAGEL 1.1 % GEL dental gel      levothyroxine (SYNTHROID) 50 MCG tablet TAKE 1/2 TABLET BY MOUTH EVERY OTHER DAY, AND 1 TABLET ON ALTERNATING DAYS 70 tablet 1   lisinopril-hydrochlorothiazide (ZESTORETIC) 20-25 MG tablet TAKE 1 TABLET BY MOUTH EVERY DAY 90 tablet 1   metoprolol tartrate (LOPRESSOR) 25 MG tablet TAKE 1.5 TABLETS (37.5 MG TOTAL) BY MOUTH 2 (TWO) TIMES DAILY. 270 tablet 3   Multiple Vitamin (MULTIVITAMIN) tablet Take 1 tablet  by mouth daily.     OMEPRAZOLE PO Take by mouth.     potassium chloride (KLOR-CON) 10 MEQ tablet Take 1 tablet (10 mEq total) by mouth daily. 90 tablet 3   No current facility-administered medications for this visit.    Allergies:   Penicillins    ROS:  Please see the history of present illness.   Otherwise, review of systems are positive for none.   All other systems are reviewed and negative.    PHYSICAL EXAM: VS:  BP 110/78 (BP Location: Right Arm)   Pulse 71   Ht 5\' 11"  (1.803 m)   Wt 243 lb 6.4 oz (110.4 kg)   BMI 33.95 kg/m  , BMI Body mass index is 33.95 kg/m. GENERAL:  Well appearing NECK:  No jugular venous distention, waveform within normal limits, carotid upstroke brisk and symmetric, no bruits, no thyromegaly LUNGS:  Clear to auscultation bilaterally CHEST:  Unremarkable HEART:  PMI not displaced or sustained,S1 and S2 within normal limits, no S3, no S4,  no clicks, no rubs, no murmurs ABD:  Flat, positive bowel sounds normal in frequency in pitch, no bruits, no rebound, no guarding, no midline pulsatile mass, no hepatomegaly, no splenomegaly EXT:  2 plus pulses throughout, no edema, no cyanosis no clubbing  EKG:  EKG is  ordered today. The ekg ordered 08/15/2020 demonstrates normal sinus rhythm, rate 71 axis within normal limits, intervals within normal limits, no acute ST-T wave changes.   Recent Labs: 04/11/2020: ALT 22; BUN 17; Creat 0.67; Hemoglobin 14.9; Platelets 248; Potassium 3.9; Sodium 145; TSH 1.17    Lipid Panel    Component Value Date/Time   CHOL 140 04/11/2020 0929   CHOL 161 05/12/2012 0829   TRIG 169 (H) 04/11/2020 0929   TRIG 271 (H) 12/04/2012 1506   TRIG 173 (H) 05/12/2012 0829   HDL 41 (L) 04/11/2020 0929   HDL 41 12/04/2012 1506   HDL 43 05/12/2012 0829   CHOLHDL 3.4 04/11/2020 0929   VLDL 35 (H) 06/07/2016 0950   LDLCALC 74 04/11/2020 0929   LDLCALC 95 12/04/2012 1506   LDLCALC 83 05/12/2012 0829      Wt Readings from Last 3 Encounters:  08/15/20 243 lb 6.4 oz (110.4 kg)  04/18/20 236 lb (107 kg)  02/28/20 239 lb (108.4 kg)      Other studies Reviewed: Additional studies/ records that were reviewed today include: Labs Review of the above records demonstrates:  NA   ASSESSMENT AND PLAN:   PALPITATIONS-    She is currently not having palpitations.  I do not think there is reason to get another monitor but would like her to get a Chad.    HTN -  The blood pressure is at target.  No change in therapy.    FH of SUDDEN CARDIAC DEATH/ICD:    She still does not know her brother's history.  She is going to get me and her sister-in-law's telephone number and I be happy to call her and talk to see if we can get some further history.  Regardless the patient had a normal echo earlier this year and no evidence of left ventricular hypertrophy which I think was her brothers issue.  There was no structural heart  disease on this echo.  Current medicines are reviewed at length with the patient today.  The patient does not have concerns regarding medicines.  The following changes have been made: None  Labs/ tests ordered today include: None  Orders Placed This  Encounter  Procedures   EKG 12-Lead      Disposition:   FU with me one year   Signed, Minus Breeding, MD  08/15/2020 4:52 PM    [facility]

## 2020-08-15 ENCOUNTER — Ambulatory Visit: Payer: Medicare HMO | Admitting: Cardiology

## 2020-08-15 ENCOUNTER — Other Ambulatory Visit: Payer: Self-pay

## 2020-08-15 ENCOUNTER — Encounter: Payer: Self-pay | Admitting: Cardiology

## 2020-08-15 VITALS — BP 110/78 | HR 71 | Ht 71.0 in | Wt 243.4 lb

## 2020-08-15 DIAGNOSIS — I10 Essential (primary) hypertension: Secondary | ICD-10-CM

## 2020-08-15 DIAGNOSIS — R002 Palpitations: Secondary | ICD-10-CM

## 2020-08-15 NOTE — Patient Instructions (Signed)
Medication Instructions:  Your physician recommends that you continue on your current medications as directed. Please refer to the Current Medication list given to you today.   *If you need a refill on your cardiac medications before your next appointment, please call your pharmacy*  Lab Work: NONE   Testing/Procedures: NONE  Follow-Up: At [facility], you and your health needs are our priority.  As part of our continuing mission to provide you with exceptional heart care, we have created designated Provider Care Teams.  These Care Teams include your primary Cardiologist (physician) and Advanced Practice Providers (APPs -  Physician Assistants and Nurse Practitioners) who all work together to provide you with the care you need, when you need it.  We recommend signing up for the patient portal called "MyChart".  Sign up information is provided on this After Visit Summary.  MyChart is used to connect with patients for Virtual Visits (Telemedicine).  Patients are able to view lab/test results, encounter notes, upcoming appointments, etc.  Non-urgent messages can be sent to your provider as well.   To learn more about what you can do with MyChart, go to [email or web address].    Your next appointment:   12 month(s)  The format for your next appointment:   In Person  Provider:   You may see DR [facility]  or one of the following Advanced Practice Providers on your designated Care Team:   Rosaria Ferries, PA-C Caron Presume, PA-C Jory Sims, DNP, ANP   Other Instructions  KARDIA IS THE MONITOR THAT WAS DISCUSSED

## 2020-08-23 ENCOUNTER — Other Ambulatory Visit: Payer: Self-pay | Admitting: Internal Medicine

## 2020-08-23 DIAGNOSIS — E063 Autoimmune thyroiditis: Secondary | ICD-10-CM

## 2020-09-24 DIAGNOSIS — L578 Other skin changes due to chronic exposure to nonionizing radiation: Secondary | ICD-10-CM | POA: Diagnosis not present

## 2020-09-24 DIAGNOSIS — L821 Other seborrheic keratosis: Secondary | ICD-10-CM | POA: Diagnosis not present

## 2020-09-24 DIAGNOSIS — L814 Other melanin hyperpigmentation: Secondary | ICD-10-CM | POA: Diagnosis not present

## 2020-09-24 DIAGNOSIS — D225 Melanocytic nevi of trunk: Secondary | ICD-10-CM | POA: Diagnosis not present

## 2020-09-24 DIAGNOSIS — L570 Actinic keratosis: Secondary | ICD-10-CM | POA: Diagnosis not present

## 2020-09-24 DIAGNOSIS — Z85828 Personal history of other malignant neoplasm of skin: Secondary | ICD-10-CM | POA: Diagnosis not present

## 2020-10-16 ENCOUNTER — Other Ambulatory Visit: Payer: Self-pay

## 2020-10-16 ENCOUNTER — Other Ambulatory Visit: Payer: Medicare HMO | Admitting: Internal Medicine

## 2020-10-16 DIAGNOSIS — Z79899 Other long term (current) drug therapy: Secondary | ICD-10-CM

## 2020-10-16 DIAGNOSIS — E669 Obesity, unspecified: Secondary | ICD-10-CM

## 2020-10-16 DIAGNOSIS — E782 Mixed hyperlipidemia: Secondary | ICD-10-CM

## 2020-10-16 LAB — HEPATIC FUNCTION PANEL
AG Ratio: 2.1 (calc) (ref 1.0–2.5)
ALT: 20 U/L (ref 6–29)
AST: 15 U/L (ref 10–35)
Albumin: 4.4 g/dL (ref 3.6–5.1)
Alkaline phosphatase (APISO): 68 U/L (ref 37–153)
Bilirubin, Direct: 0.2 mg/dL (ref 0.0–0.2)
Globulin: 2.1 g/dL (calc) (ref 1.9–3.7)
Indirect Bilirubin: 0.7 mg/dL (calc) (ref 0.2–1.2)
Total Bilirubin: 0.9 mg/dL (ref 0.2–1.2)
Total Protein: 6.5 g/dL (ref 6.1–8.1)

## 2020-10-16 LAB — LIPID PANEL
Cholesterol: 147 mg/dL (ref <200)
HDL: 44 mg/dL — ABNORMAL LOW (ref >=50)
LDL Cholesterol (Calc): 76 mg/dL (calc)
Non-HDL Cholesterol (Calc): 103 mg/dL (calc) (ref <130)
Total CHOL/HDL Ratio: 3.3 (calc) (ref <5.0)
Triglycerides: 171 mg/dL — ABNORMAL HIGH (ref <150)

## 2020-10-21 ENCOUNTER — Other Ambulatory Visit: Payer: Self-pay | Admitting: Internal Medicine

## 2020-10-23 ENCOUNTER — Other Ambulatory Visit: Payer: Self-pay

## 2020-10-23 ENCOUNTER — Ambulatory Visit (INDEPENDENT_AMBULATORY_CARE_PROVIDER_SITE_OTHER): Payer: Medicare HMO | Admitting: Internal Medicine

## 2020-10-23 ENCOUNTER — Encounter: Payer: Self-pay | Admitting: Internal Medicine

## 2020-10-23 VITALS — BP 122/76 | HR 63 | Ht 71.0 in | Wt 230.0 lb

## 2020-10-23 DIAGNOSIS — Z8673 Personal history of transient ischemic attack (TIA), and cerebral infarction without residual deficits: Secondary | ICD-10-CM

## 2020-10-23 DIAGNOSIS — E039 Hypothyroidism, unspecified: Secondary | ICD-10-CM

## 2020-10-23 DIAGNOSIS — Z0000 Encounter for general adult medical examination without abnormal findings: Secondary | ICD-10-CM

## 2020-10-23 DIAGNOSIS — H6501 Acute serous otitis media, right ear: Secondary | ICD-10-CM | POA: Diagnosis not present

## 2020-10-23 DIAGNOSIS — I10 Essential (primary) hypertension: Secondary | ICD-10-CM

## 2020-10-23 DIAGNOSIS — E782 Mixed hyperlipidemia: Secondary | ICD-10-CM | POA: Diagnosis not present

## 2020-10-23 MED ORDER — ATORVASTATIN CALCIUM 80 MG PO TABS: 80.0000 mg | ORAL_TABLET | Freq: Every day | ORAL | 3 refills | Status: DC

## 2020-10-23 MED ORDER — AZITHROMYCIN 250 MG PO TABS: ORAL_TABLET | ORAL | 0 refills | Status: AC

## 2020-10-23 NOTE — Patient Instructions (Addendum)
Take Zithromax Z-PAK 2 tabs day 1 followed by 1 tab days 2 through 5.  Increase Atoravastin to 80 mg daily. RTC in 3 months for labs only with increased statin dose and RTC in 6 months for Medicare wellness and health maintenance exam. To get flu vaccine later. Declines Covid vaccine.Mammogram ordered.

## 2020-10-23 NOTE — Progress Notes (Signed)
   Subjective:    Patient ID: Katie Allen, female    DOB: 04-23-1950, 70 y.o.   MRN: [id number]  HPI 70 year old Female for  6 month follow up.  Had Medicare wellness exam in March.  She had COVID-19 virus infection in December 2021 and recovered.  She has not been vaccinated for COVID-19.  History of palpitations and shortness of breath seen by Dr. Percival Spanish for follow up in July.  There is a family history of sudden cardiac death of her brother who has an ICD.  She takes a beta-blocker and has PVCs at times.  She she had 2D echocardiogram with image enhancing agent January 2022 showing no significant abnormalities.  EKG in July done by Dr. Percival Spanish was within normal limits.  Had Medicare annual wellness visit in March 2022.  After having COVID she had some hair loss despite having normal TSH on levothyroxine.  This has improved.  She has a history of GE reflux, hyperlipidemia, hypertension.  She is maintained on Plavix for TIA in July 2013 while on estrogen replacement.  She complained of numbness in arm and leg for 2 weeks.  MRI was unremarkable except for small vessel disease.  She has a history of impaired glucose tolerance.  History of dependent edema.  Triglycerides are mildly elevated at 171 and previously were 169 in March 2022.  HDL is 44 and stable.  Total cholesterol 147 and LDL cholesterol 76.  She will increase atorvastatin to 80 mg daily and follow-up with lipid panel liver functions without office visit in December.    Remains on metoprolol and lisinopril HCTZ 20/25 for hypertension.    She takes potassium supplementation due to being on diuretic for hypertension.  She is on levothyroxine 50 mcg daily for hypothyroidism and TSH in March was normal.  Review of Systems new issue is some ear fullness on the right     Objective:   Physical Exam Vital signs reviewed and are stable.  BMI 32.08 weight 230 pounds Skin is warm and dry.  No cervical adenopathy or carotid  bruits.  Chest is clear to auscultation.  Cardiac exam: Regular rate and rhythm without ectopy.  No lower extremity pitting edema. TMs slightly full on the right     Assessment & Plan:  History of COVID-27 January 2020.  Has not been vaccinated for COVID-19.  BMI 32.08-continue diet and exercise efforts  History of palpitations treated with metoprolol and has been seen by Dr. Percival Spanish  History of sudden cardiac death in brother who had ICD.  Essential hypertension-stable on Zestoretic and metoprolol  Hypothyroidism-treated with thyroid replacement therapy and TSH was normal in March at 1.17  Right serous otitis media-take Zithromax Z-PAK  Plan: Continue current medications.  Continue to work on diet and exercise efforts.  Take Zithromax Z-PAK as directed for right ear discomfort/serous otitis media.  Increase atorvastatin to 80 mg daily.  Have lipid panel liver functions in 3 months without office visit.  Return in 6 months for Medicare wellness and health maintenance exam.  Declines flu vaccine.  Declines COVID-vaccine.  Mammogram ordered.

## 2020-11-11 ENCOUNTER — Other Ambulatory Visit: Payer: Self-pay | Admitting: Internal Medicine

## 2020-11-11 DIAGNOSIS — Z1231 Encounter for screening mammogram for malignant neoplasm of breast: Secondary | ICD-10-CM

## 2020-11-25 DIAGNOSIS — H8091 Unspecified otosclerosis, right ear: Secondary | ICD-10-CM | POA: Diagnosis not present

## 2020-11-25 DIAGNOSIS — H90A31 Mixed conductive and sensorineural hearing loss, unilateral, right ear with restricted hearing on the contralateral side: Secondary | ICD-10-CM | POA: Diagnosis not present

## 2020-11-25 DIAGNOSIS — H9192 Unspecified hearing loss, left ear: Secondary | ICD-10-CM | POA: Diagnosis not present

## 2020-12-03 DIAGNOSIS — H1045 Other chronic allergic conjunctivitis: Secondary | ICD-10-CM | POA: Diagnosis not present

## 2020-12-03 DIAGNOSIS — H02882 Meibomian gland dysfunction right lower eyelid: Secondary | ICD-10-CM | POA: Diagnosis not present

## 2020-12-03 DIAGNOSIS — H02885 Meibomian gland dysfunction left lower eyelid: Secondary | ICD-10-CM | POA: Diagnosis not present

## 2020-12-03 DIAGNOSIS — H04123 Dry eye syndrome of bilateral lacrimal glands: Secondary | ICD-10-CM | POA: Diagnosis not present

## 2020-12-15 IMAGING — MG DIGITAL SCREENING BILAT W/ TOMO W/ CAD
8 series · 8 of 24 positions shown · non-contrast
Comparison: Previous exam(s).

CLINICAL DATA: Screening.

EXAM:
DIGITAL SCREENING BILATERAL MAMMOGRAM WITH TOMO AND CAD

[R CC synth-2D]
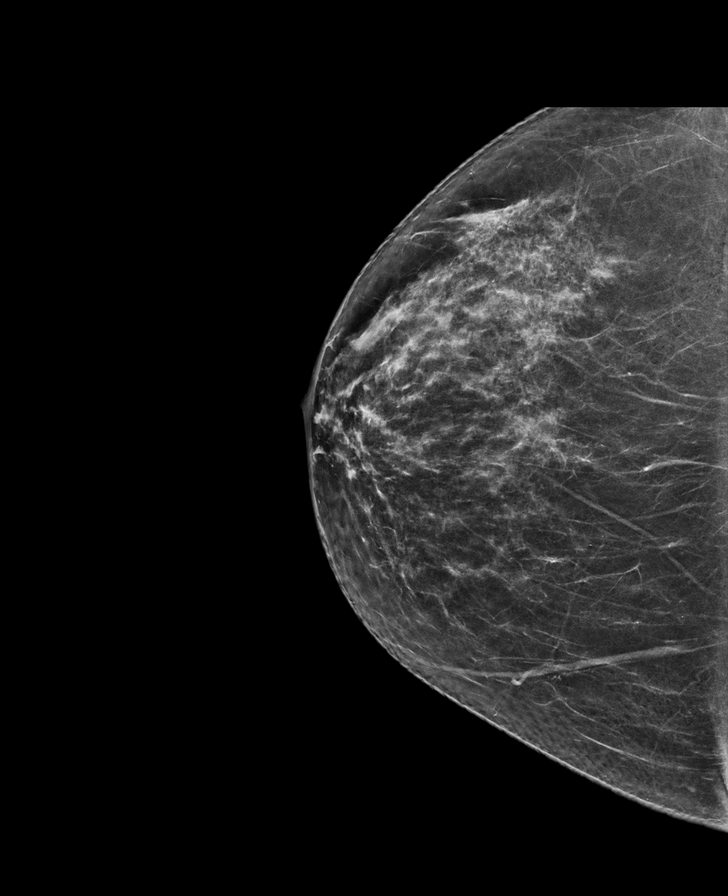

[L CC synth-2D]
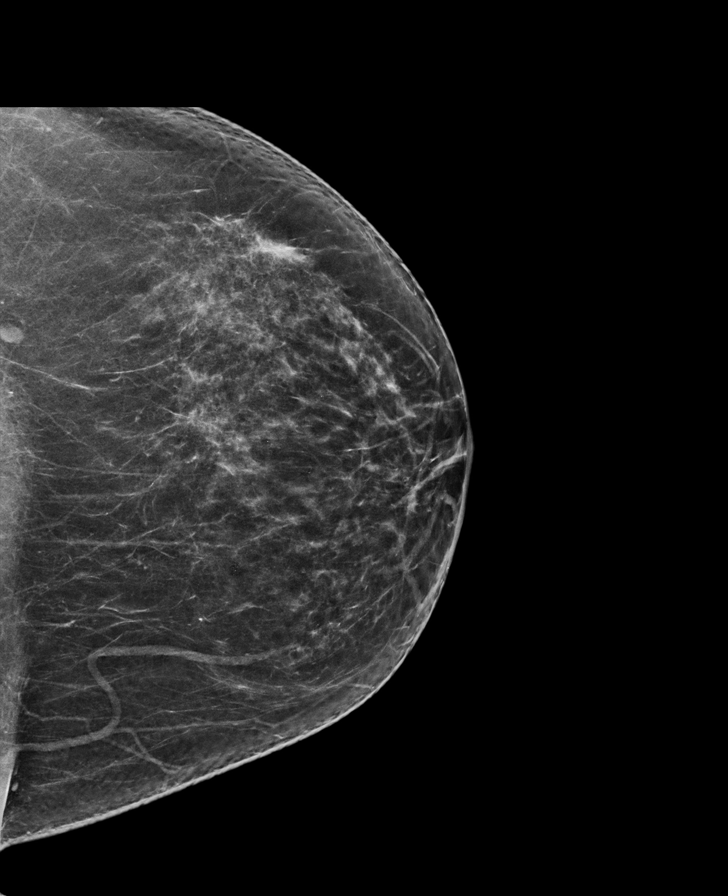

[L MLO synth-2D]
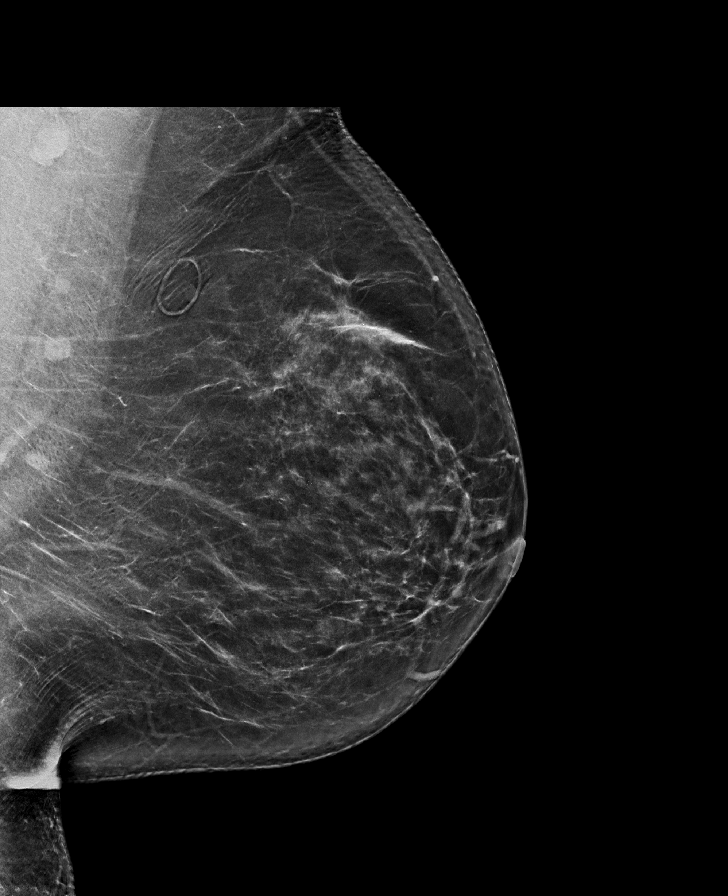

[R MLO synth-2D]
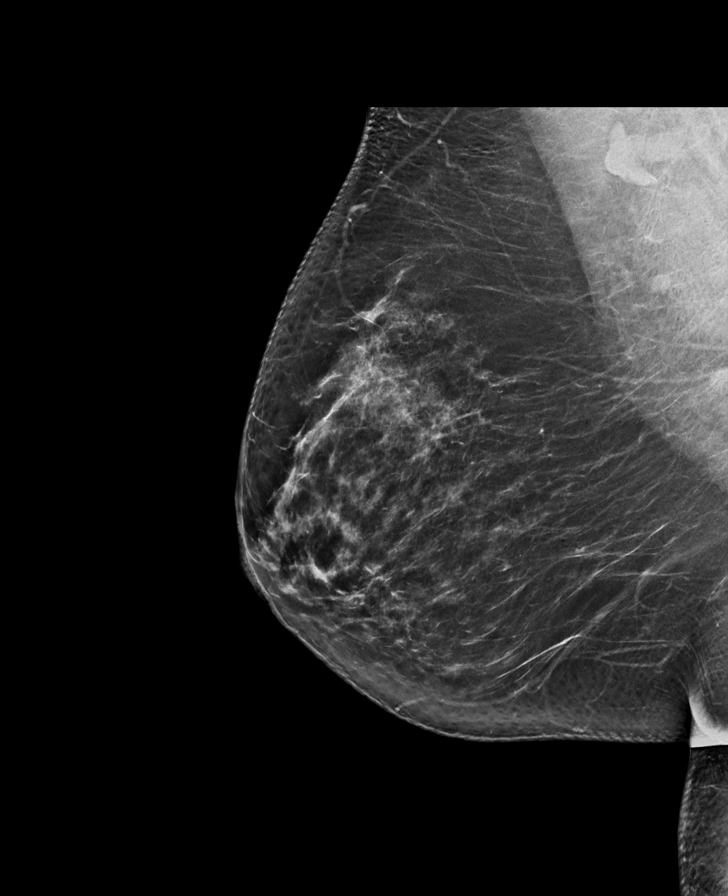

[R CC tomo · tomo slice 35/68.0]
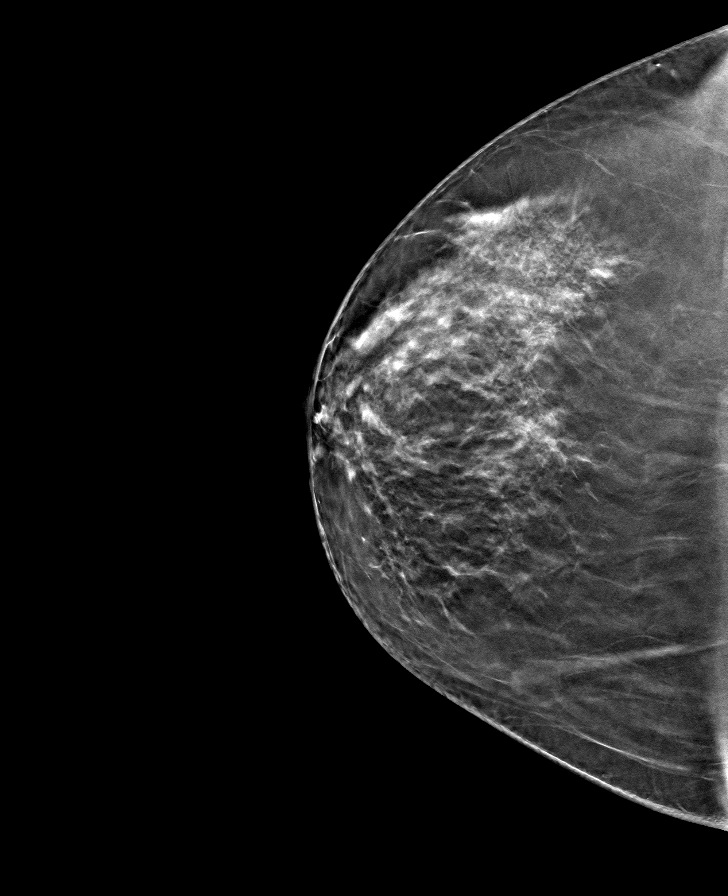

[L CC tomo · tomo slice 37/74.0]
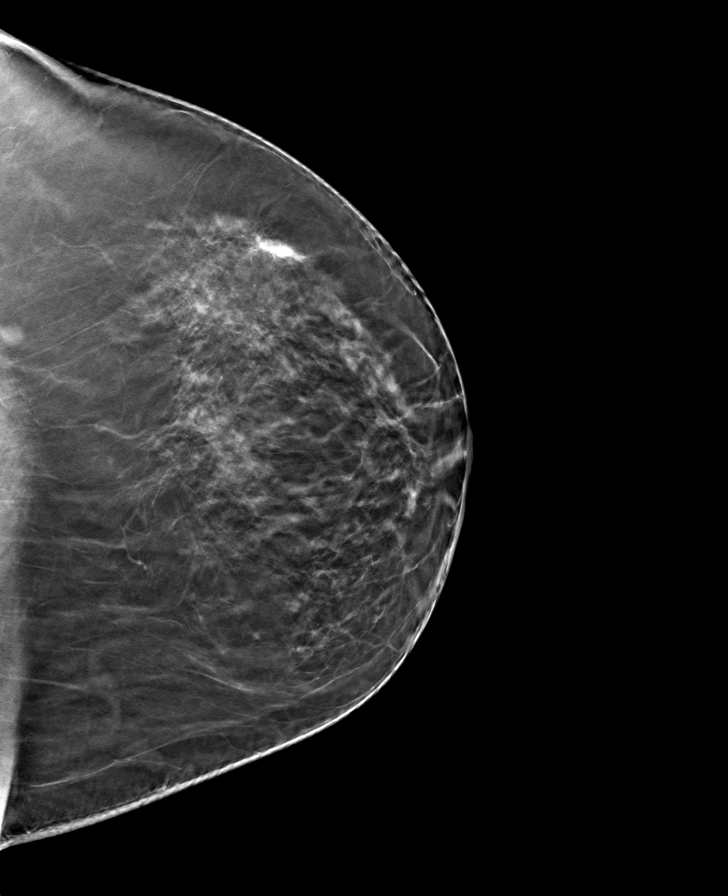

[L MLO tomo · tomo slice 43/85.0]
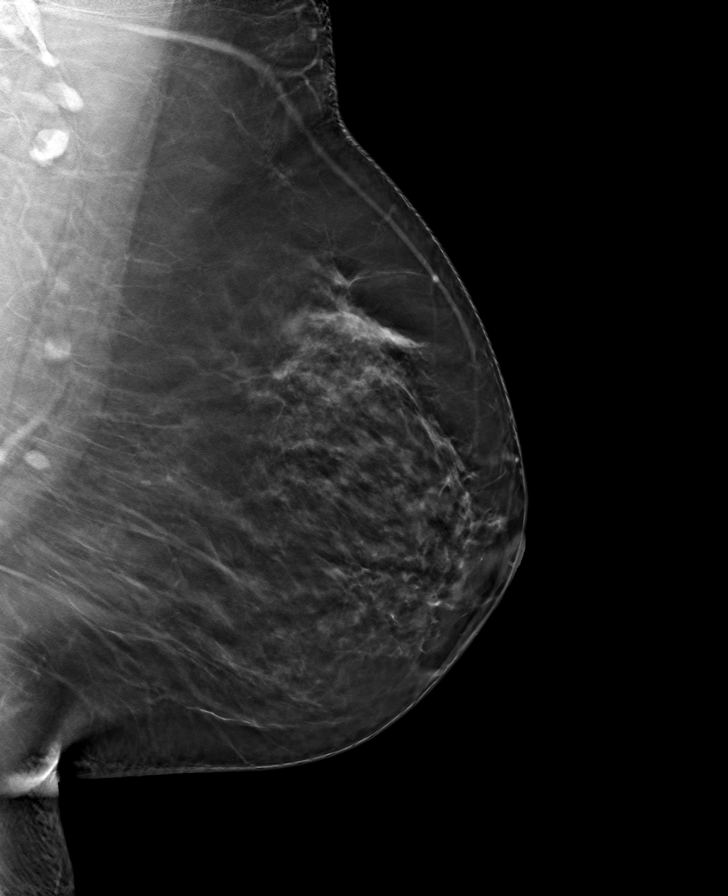

[R MLO tomo · tomo slice 41/81.0]
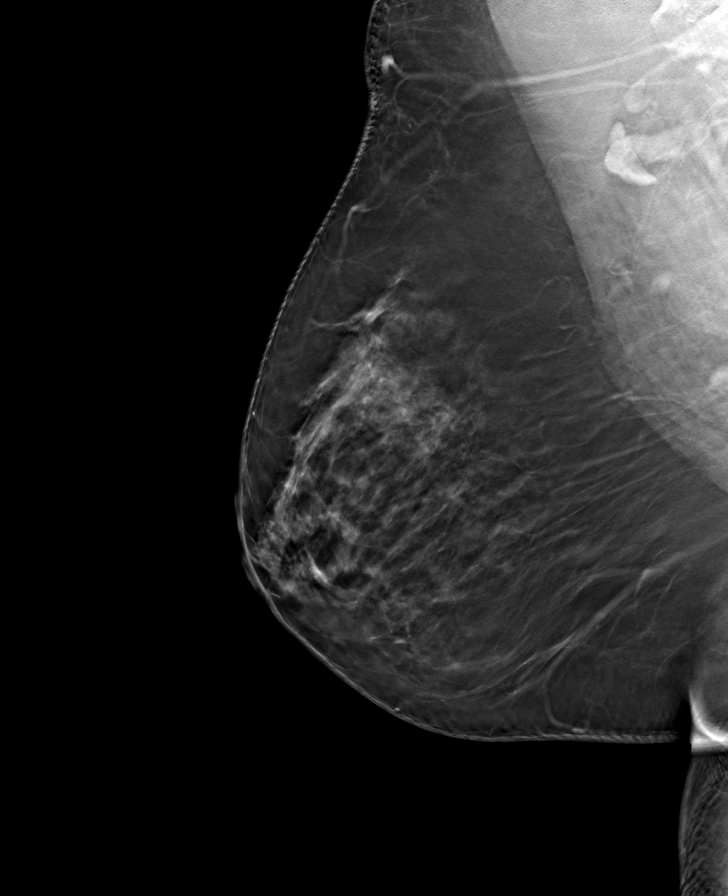

[8 of 24 positions shown; findings below may reference images not displayed]

ACR Breast Density Category b: There are scattered areas of
fibroglandular density.
FINDINGS: There are no findings suspicious for malignancy. Images were
processed with CAD.
IMPRESSION: No mammographic evidence of malignancy. A result letter of this
screening mammogram will be mailed directly to the patient.

RECOMMENDATION:
Screening mammogram in one year. (Code:CN-U-775)

BI-RADS CATEGORY  1: Negative.

## 2020-12-20 ENCOUNTER — Other Ambulatory Visit: Payer: Self-pay | Admitting: Internal Medicine

## 2021-01-07 DIAGNOSIS — C44722 Squamous cell carcinoma of skin of right lower limb, including hip: Secondary | ICD-10-CM | POA: Diagnosis not present

## 2021-01-07 DIAGNOSIS — D485 Neoplasm of uncertain behavior of skin: Secondary | ICD-10-CM | POA: Diagnosis not present

## 2021-01-09 ENCOUNTER — Other Ambulatory Visit: Payer: Self-pay | Admitting: Internal Medicine

## 2021-01-23 ENCOUNTER — Other Ambulatory Visit: Payer: Medicare HMO | Admitting: Internal Medicine

## 2021-01-23 ENCOUNTER — Ambulatory Visit
Admission: RE | Admit: 2021-01-23 | Discharge: 2021-01-23 | Disposition: A | Discharge status: Home / Self Care | Payer: Medicare HMO | Source: Ambulatory Visit

## 2021-01-23 ENCOUNTER — Other Ambulatory Visit: Payer: Self-pay

## 2021-01-23 DIAGNOSIS — E782 Mixed hyperlipidemia: Secondary | ICD-10-CM

## 2021-01-23 DIAGNOSIS — I10 Essential (primary) hypertension: Secondary | ICD-10-CM | POA: Diagnosis not present

## 2021-01-23 DIAGNOSIS — Z1231 Encounter for screening mammogram for malignant neoplasm of breast: Secondary | ICD-10-CM | POA: Diagnosis not present

## 2021-01-24 LAB — LIPID PANEL
Cholesterol: 133 mg/dL (ref <200)
HDL: 40 mg/dL — ABNORMAL LOW (ref >=50)
LDL Cholesterol (Calc): 72 mg/dL (calc)
Non-HDL Cholesterol (Calc): 93 mg/dL (calc) (ref <130)
Total CHOL/HDL Ratio: 3.3 (calc) (ref <5.0)
Triglycerides: 130 mg/dL (ref <150)

## 2021-01-24 LAB — HEPATIC FUNCTION PANEL
AG Ratio: 2 (calc) (ref 1.0–2.5)
ALT: 20 U/L (ref 6–29)
AST: 16 U/L (ref 10–35)
Albumin: 4.2 g/dL (ref 3.6–5.1)
Alkaline phosphatase (APISO): 78 U/L (ref 37–153)
Bilirubin, Direct: 0.2 mg/dL (ref 0.0–0.2)
Globulin: 2.1 g/dL (calc) (ref 1.9–3.7)
Indirect Bilirubin: 0.6 mg/dL (calc) (ref 0.2–1.2)
Total Bilirubin: 0.8 mg/dL (ref 0.2–1.2)
Total Protein: 6.3 g/dL (ref 6.1–8.1)

## 2021-02-18 DIAGNOSIS — C44722 Squamous cell carcinoma of skin of right lower limb, including hip: Secondary | ICD-10-CM | POA: Diagnosis not present

## 2021-02-18 DIAGNOSIS — L905 Scar conditions and fibrosis of skin: Secondary | ICD-10-CM | POA: Diagnosis not present

## 2021-04-17 ENCOUNTER — Other Ambulatory Visit: Payer: Medicare HMO | Admitting: Internal Medicine

## 2021-04-17 ENCOUNTER — Other Ambulatory Visit: Payer: Self-pay

## 2021-04-17 DIAGNOSIS — R7301 Impaired fasting glucose: Secondary | ICD-10-CM | POA: Diagnosis not present

## 2021-04-17 DIAGNOSIS — E782 Mixed hyperlipidemia: Secondary | ICD-10-CM | POA: Diagnosis not present

## 2021-04-17 DIAGNOSIS — E039 Hypothyroidism, unspecified: Secondary | ICD-10-CM | POA: Diagnosis not present

## 2021-04-17 DIAGNOSIS — I10 Essential (primary) hypertension: Secondary | ICD-10-CM

## 2021-04-20 NOTE — Addendum Note (Signed)
Addended by: Angus Seller on: 04/20/2021 03:12 PM ? ? Modules accepted: Orders ? ?

## 2021-04-22 LAB — COMPLETE METABOLIC PANEL WITH GFR
AG Ratio: 1.8 (calc) (ref 1.0–2.5)
ALT: 16 U/L (ref 6–29)
AST: 16 U/L (ref 10–35)
Albumin: 4 g/dL (ref 3.6–5.1)
Alkaline phosphatase (APISO): 76 U/L (ref 37–153)
BUN: 15 mg/dL (ref 7–25)
CO2: 29 mmol/L (ref 20–32)
Calcium: 10 mg/dL (ref 8.6–10.4)
Chloride: 107 mmol/L (ref 98–110)
Creat: 0.56 mg/dL (ref 0.50–1.05)
Globulin: 2.2 g/dL (calc) (ref 1.9–3.7)
Glucose, Bld: 101 mg/dL — ABNORMAL HIGH (ref 65–99)
Potassium: 4.7 mmol/L (ref 3.5–5.3)
Sodium: 142 mmol/L (ref 135–146)
Total Bilirubin: 1 mg/dL (ref 0.2–1.2)
Total Protein: 6.2 g/dL (ref 6.1–8.1)
eGFR: 99 mL/min/{1.73_m2} (ref >=60)

## 2021-04-22 LAB — HEMOGLOBIN A1C W/OUT EAG: Hgb A1c MFr Bld: 6 % of total Hgb — ABNORMAL HIGH (ref <5.7)

## 2021-04-22 LAB — CBC WITH DIFFERENTIAL/PLATELET
Absolute Monocytes: 494 cells/uL (ref 200–950)
Basophils Absolute: 20 cells/uL (ref 0–200)
Basophils Relative: 0.3 %
Eosinophils Absolute: 156 cells/uL (ref 15–500)
Eosinophils Relative: 2.4 %
HCT: 42 % (ref 35.0–45.0)
Hemoglobin: 14 g/dL (ref 11.7–15.5)
Lymphs Abs: 2743 cells/uL (ref 850–3900)
MCH: 29.5 pg (ref 27.0–33.0)
MCHC: 33.3 g/dL (ref 32.0–36.0)
MCV: 88.6 fL (ref 80.0–100.0)
MPV: 11.2 fL (ref 7.5–12.5)
Monocytes Relative: 7.6 %
Neutro Abs: 3088 cells/uL (ref 1500–7800)
Neutrophils Relative %: 47.5 %
Platelets: 242 10*3/uL (ref 140–400)
RBC: 4.74 10*6/uL (ref 3.80–5.10)
RDW: 13 % (ref 11.0–15.0)
Total Lymphocyte: 42.2 %
WBC: 6.5 10*3/uL (ref 3.8–10.8)

## 2021-04-22 LAB — LIPID PANEL
Cholesterol: 116 mg/dL (ref <200)
HDL: 42 mg/dL — ABNORMAL LOW (ref >=50)
LDL Cholesterol (Calc): 55 mg/dL (calc)
Non-HDL Cholesterol (Calc): 74 mg/dL (calc) (ref <130)
Total CHOL/HDL Ratio: 2.8 (calc) (ref <5.0)
Triglycerides: 103 mg/dL (ref <150)

## 2021-04-22 LAB — TSH: TSH: 1.02 mIU/L (ref 0.40–4.50)

## 2021-04-24 ENCOUNTER — Ambulatory Visit (INDEPENDENT_AMBULATORY_CARE_PROVIDER_SITE_OTHER): Payer: Medicare HMO | Admitting: Internal Medicine

## 2021-04-24 ENCOUNTER — Encounter: Payer: Self-pay | Admitting: Internal Medicine

## 2021-04-24 ENCOUNTER — Other Ambulatory Visit: Payer: Self-pay

## 2021-04-24 VITALS — BP 112/68 | HR 65 | Temp 97.8°F | Ht 70.5 in | Wt 243.5 lb

## 2021-04-24 DIAGNOSIS — I10 Essential (primary) hypertension: Secondary | ICD-10-CM | POA: Diagnosis not present

## 2021-04-24 DIAGNOSIS — E039 Hypothyroidism, unspecified: Secondary | ICD-10-CM

## 2021-04-24 DIAGNOSIS — Z87898 Personal history of other specified conditions: Secondary | ICD-10-CM | POA: Diagnosis not present

## 2021-04-24 DIAGNOSIS — N3941 Urge incontinence: Secondary | ICD-10-CM | POA: Diagnosis not present

## 2021-04-24 DIAGNOSIS — B351 Tinea unguium: Secondary | ICD-10-CM

## 2021-04-24 DIAGNOSIS — Z8719 Personal history of other diseases of the digestive system: Secondary | ICD-10-CM

## 2021-04-24 DIAGNOSIS — R7302 Impaired glucose tolerance (oral): Secondary | ICD-10-CM | POA: Diagnosis not present

## 2021-04-24 DIAGNOSIS — R82998 Other abnormal findings in urine: Secondary | ICD-10-CM

## 2021-04-24 DIAGNOSIS — Z0000 Encounter for general adult medical examination without abnormal findings: Secondary | ICD-10-CM | POA: Diagnosis not present

## 2021-04-24 DIAGNOSIS — C4492 Squamous cell carcinoma of skin, unspecified: Secondary | ICD-10-CM

## 2021-04-24 DIAGNOSIS — E782 Mixed hyperlipidemia: Secondary | ICD-10-CM | POA: Diagnosis not present

## 2021-04-24 DIAGNOSIS — Z8673 Personal history of transient ischemic attack (TIA), and cerebral infarction without residual deficits: Secondary | ICD-10-CM

## 2021-04-24 DIAGNOSIS — Z6834 Body mass index (BMI) 34.0-34.9, adult: Secondary | ICD-10-CM | POA: Diagnosis not present

## 2021-04-24 LAB — POCT URINALYSIS DIPSTICK
Bilirubin, UA: NEGATIVE
Blood, UA: 5
Glucose, UA: NEGATIVE
Nitrite, UA: NEGATIVE
Protein, UA: NEGATIVE
Spec Grav, UA: 1.015 (ref 1.010–1.025)
Urobilinogen, UA: 0.2 E.U./dL
pH, UA: 5 (ref 5.0–8.0)

## 2021-04-24 MED ORDER — OXYBUTYNIN CHLORIDE ER 10 MG PO TB24: 10.0000 mg | ORAL_TABLET | Freq: Every day | ORAL | 5 refills | Status: DC

## 2021-04-24 MED ORDER — METFORMIN HCL 500 MG PO TABS: 500.0000 mg | ORAL_TABLET | Freq: Two times a day (BID) | ORAL | 3 refills | Status: DC

## 2021-04-24 NOTE — Progress Notes (Deleted)
IElby Showers, MD, have reviewed all documentation for this visit. The documentation on 04/24/21 for the exam, diagnosis, procedures, and orders are all accurate and complete. ?

## 2021-04-24 NOTE — Patient Instructions (Addendum)
See Dr. Renda Rolls about toe nail fungus. Try Ditropan for incontinence. Urine culture sent today as urine dipstick is abnormal.  Please continue to work on diet exercise and weight loss.  Continue current medications.  Follow-up in 6 months. ?

## 2021-04-24 NOTE — Progress Notes (Signed)
? ? ? ?Annual Wellness Visit ? ?  ? ?Patient: Katie Allen, Female    DOB: [birth year], 71 y.o.   MRN: [id number] ?Visit Date: 04/24/2021 ? ?Chief Complaint  ?Patient presents with  ? Medicare Wellness  ? ?Subjective  ?  ?Katie Allen is a 71 y.o. Female who presents today for her Annual Wellness Visit. ? ?HPI She is also here for health maintenance exam and evaluation of medical issues.  She is having issues with onychomycosis and will be seeing Dermatologist soon and can consult with them about appropriate treatment. ? ?Having some issues with urinary incontinence.  Can try Ditropan XL 10 mg at bedtime.  If this is not working may need Urology referral. Hx of cystocele. ? ?History of hyperlipidemia treated with statin medication and  history of HTN.  History of hypothyroidism. ? ?History of squamous cell carcinoma of skin. ? ?History of palpitations seen by Dr. Percival Spanish.  History of occasional PVCs in the past.  She saw Dr. Percival Spanish August 15, 2020 after a bout of palpitations for about 3 days. ?EKG was normal at that time.  He recommended a Kardia device.  Echocardiogram January 2022 was normal. ? 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  Diagnosed with otosclerosis of right ear.  Seen by [facility].  History of mild tinnitus. ? ?History of GE reflux.  She takes PPI and is seeing Dr. Cristina Gong in the past.  History of dependent edema.  She has maintained on Plavix.  She had a TIA in July 2013 on estrogen replacement.  She complained of numbness in her arm and leg for 2 weeks.  MRI was unremarkable except for small vessel disease. ? ?History of impaired glucose tolerance. ? ?In February 2019 was screened for abdominal aortic aneurysm due to family history but no aneurysm was present. ? ?History of PVCs and bigeminy.  History of palpitations.  She had a treadmill test in 2015 which was stopped due to dyspnea showing PVCs during exercise and  recovery but no sustained runs of PVCs. ? ?Had cholecystectomy 2010. ? ?History of mild carotid stenosis 1 to 39% followed by Dr. Percival Spanish. ? ?She had a TIA in July 2013 on estrogen replacement.  She complained of numbness in arm and leg for 2 weeks.  MRI was unremarkable except for small vessel disease.  She is maintained on Plavix. ? 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  She resides in [place].  Husband has issues with heart failure.  1 adult son.  Does not smoke or consume alcohol. ? ?Family history: Father died at age 17 with an MI and had history of stroke and abdominal aortic aneurysm.  Mother died at age 14 of colon cancer.  Patient had a twin brother who had history of pacemaker and defibrillator with atrial fibrillation apparently had sudden cardiac death.  Also another brother in his 51s in good health with hypertension.  He had another brother in good health in his 30s.  1 sister in good health. ? ? ?Patient Care Team: ?Elby Showers, MD as PCP - General (Internal Medicine) ? ?Review of Systems no new complaints except for onychomycosis and urge urinary incontinence ? ? Objective  ?  ?Vitals:  ? ?Physical Exam blood pressure 112/68 pulse 65 temperature 97.8 degrees pulse oximetry  97% weight 243 pounds 8 ounces height 5 feet 10.5 inches BMI 34.44 ? ?Skin: Warm and dry.  Has onychomycosis both great toenails.  No cervical adenopathy.  No thyromegaly.  No carotid bruits.  Chest clear to auscultation.  Cardiac exam: Regular rate and rhythm without ectopy today.  Abdomen is soft nondistended without hepatosplenomegaly masses or tenderness.  No lower extremity pitting edema.  Bimanual exam is normal.  Neurological exam is intact without gross focal deficits.  Affect thought and judgment are normal. ? ? ?Most recent functional status assessment: ?In  your present state of health, do you have any difficulty performing the following activities: 04/24/2021  ?Hearing? N  ?Vision? N  ?Difficulty concentrating or making decisions? N  ?Walking or climbing stairs? N  ?Dressing or bathing? N  ?Doing errands, shopping? N  ?Preparing Food and eating ? N  ?Using the Toilet? N  ?In the past six months, have you accidently leaked urine? Y  ?Do you have problems with loss of bowel control? N  ?Managing your Medications? N  ?Managing your Finances? N  ?Housekeeping or managing your Housekeeping? N  ?Some recent data might be hidden  ? ?Most recent fall risk assessment: ?Fall Risk  04/24/2021  ?Falls in the past year? 0  ?Number falls in past yr: 0  ?Injury with Fall? 0  ?Comment -  ?Risk for fall due to : No Fall Risks  ?Follow up Falls evaluation completed  ? ? Most recent depression screenings: ?PHQ 2/9 Scores 04/24/2021 04/18/2020  ?PHQ - 2 Score 0 0  ?PHQ- 9 Score - -  ? ?Most recent cognitive screening: ?6CIT Screen 04/24/2021  ?What time? 0 points  ?Count back from 20 0 points  ?Months in reverse 0 points  ?Repeat phrase 0 points  ? ? ? ? ? Assessment & Plan  ?  ? ?Annual wellness visit done today including the all of the following: ?Reviewed patient's Family Medical History ?Reviewed and updated list of patient's medical providers ?Assessment of cognitive impairment was done ?Assessed patient's functional ability ?Established a written schedule for health screening services ?Health Risk Assessent Completed and Reviewed ? ?Discussed health benefits of physical activity, and encouraged her to engage in regular exercise appropriate for her age and condition.  ?  ?Plan: She will ask her Dermatologist about onychomycosis.  She will have annual follow-up with Dr. Percival Spanish.  She has small LE on urine dipstick and culture will be obtained.  She has some issues with urinary urgency/incontinence and will try Ditropan XL 10 mg daily.  If this does not work she may need urology  consultation. ? ?Blood pressure is stable on Zestoretic and metoprolol.  She has low HDL of 42 but lipids are within normal limits otherwise.  TSH is normal.  Hemoglobin A1c is 6%.  Last year it was 5.9%.  She should consider metformin twice daily with follow-up in 6 months. ? ?  ? ?I, Elby Showers, MD, have reviewed all documentation for this visit. The documentation on 04/24/21 for the exam, diagnosis, procedures, and orders are all accurate and complete. ? ? ?Angus Seller, CMA  ? ?

## 2021-04-26 LAB — URINE CULTURE
MICRO NUMBER:: 13145604
Result:: NO GROWTH
SPECIMEN QUALITY:: ADEQUATE

## 2021-05-07 DIAGNOSIS — H5210 Myopia, unspecified eye: Secondary | ICD-10-CM | POA: Diagnosis not present

## 2021-05-07 DIAGNOSIS — Z0100 Encounter for examination of eyes and vision without abnormal findings: Secondary | ICD-10-CM | POA: Diagnosis not present

## 2021-05-09 ENCOUNTER — Other Ambulatory Visit: Payer: Self-pay | Admitting: Internal Medicine

## 2021-05-09 DIAGNOSIS — E063 Autoimmune thyroiditis: Secondary | ICD-10-CM

## 2021-07-18 ENCOUNTER — Other Ambulatory Visit: Payer: Self-pay | Admitting: Internal Medicine

## 2021-10-06 ENCOUNTER — Other Ambulatory Visit: Payer: Self-pay | Admitting: Internal Medicine

## 2021-10-08 ENCOUNTER — Other Ambulatory Visit: Payer: Medicare HMO

## 2021-10-08 DIAGNOSIS — R7302 Impaired glucose tolerance (oral): Secondary | ICD-10-CM

## 2021-10-08 DIAGNOSIS — I10 Essential (primary) hypertension: Secondary | ICD-10-CM | POA: Diagnosis not present

## 2021-10-08 DIAGNOSIS — E782 Mixed hyperlipidemia: Secondary | ICD-10-CM

## 2021-10-08 DIAGNOSIS — E039 Hypothyroidism, unspecified: Secondary | ICD-10-CM

## 2021-10-08 NOTE — Addendum Note (Signed)
Addended by: Geradine Girt D on: 10/08/2021 10:35 AM   Modules accepted: Orders

## 2021-10-08 NOTE — Addendum Note (Signed)
Addended by: Geradine Girt D on: 10/08/2021 10:31 AM   Modules accepted: Orders

## 2021-10-15 LAB — TSH: TSH: 1 mIU/L (ref 0.40–4.50)

## 2021-10-15 LAB — HEPATIC FUNCTION PANEL
AG Ratio: 2 (calc) (ref 1.0–2.5)
ALT: 17 U/L (ref 6–29)
AST: 16 U/L (ref 10–35)
Albumin: 4.2 g/dL (ref 3.6–5.1)
Alkaline phosphatase (APISO): 81 U/L (ref 37–153)
Bilirubin, Direct: 0.1 mg/dL (ref 0.0–0.2)
Globulin: 2.1 g/dL (calc) (ref 1.9–3.7)
Indirect Bilirubin: 0.5 mg/dL (calc) (ref 0.2–1.2)
Total Bilirubin: 0.6 mg/dL (ref 0.2–1.2)
Total Protein: 6.3 g/dL (ref 6.1–8.1)

## 2021-10-15 LAB — LIPID PANEL
Cholesterol: 125 mg/dL (ref <200)
HDL: 41 mg/dL — ABNORMAL LOW (ref >=50)
LDL Cholesterol (Calc): 60 mg/dL (calc)
Non-HDL Cholesterol (Calc): 84 mg/dL (calc) (ref <130)
Total CHOL/HDL Ratio: 3 (calc) (ref <5.0)
Triglycerides: 165 mg/dL — ABNORMAL HIGH (ref <150)

## 2021-10-15 LAB — HEMOGLOBIN A1C
Hgb A1c MFr Bld: 5.9 % of total Hgb — ABNORMAL HIGH (ref <5.7)
Mean Plasma Glucose: 123 mg/dL
eAG (mmol/L): 6.8 mmol/L

## 2021-10-17 ENCOUNTER — Other Ambulatory Visit: Payer: Self-pay | Admitting: Internal Medicine

## 2021-10-22 ENCOUNTER — Other Ambulatory Visit: Payer: Medicare HMO

## 2021-10-23 ENCOUNTER — Ambulatory Visit (INDEPENDENT_AMBULATORY_CARE_PROVIDER_SITE_OTHER): Payer: Medicare HMO | Admitting: Internal Medicine

## 2021-10-23 ENCOUNTER — Encounter: Payer: Self-pay | Admitting: Internal Medicine

## 2021-10-23 VITALS — BP 116/70 | HR 61 | Temp 97.9°F | Ht 70.5 in | Wt 245.8 lb

## 2021-10-23 DIAGNOSIS — Z87898 Personal history of other specified conditions: Secondary | ICD-10-CM

## 2021-10-23 DIAGNOSIS — E782 Mixed hyperlipidemia: Secondary | ICD-10-CM

## 2021-10-23 DIAGNOSIS — N3941 Urge incontinence: Secondary | ICD-10-CM | POA: Diagnosis not present

## 2021-10-23 DIAGNOSIS — Z6834 Body mass index (BMI) 34.0-34.9, adult: Secondary | ICD-10-CM

## 2021-10-23 DIAGNOSIS — R7302 Impaired glucose tolerance (oral): Secondary | ICD-10-CM

## 2021-10-23 DIAGNOSIS — E039 Hypothyroidism, unspecified: Secondary | ICD-10-CM | POA: Diagnosis not present

## 2021-10-23 DIAGNOSIS — Z0000 Encounter for general adult medical examination without abnormal findings: Secondary | ICD-10-CM | POA: Diagnosis not present

## 2021-10-23 DIAGNOSIS — I10 Essential (primary) hypertension: Secondary | ICD-10-CM | POA: Diagnosis not present

## 2021-10-23 DIAGNOSIS — Z8673 Personal history of transient ischemic attack (TIA), and cerebral infarction without residual deficits: Secondary | ICD-10-CM

## 2021-10-23 NOTE — Patient Instructions (Addendum)
Mammogram order placed. Labs are reviewed in detail and are stable. Vaccines discussed.  RTC in 6 months for Medicare wellness and CPE with fasting labs.

## 2021-10-23 NOTE — Progress Notes (Signed)
   Subjective:    Patient ID: Katie Allen, female    DOB: 09/29/1950, 71 y.o.   MRN: [id number]  HPI 71 year old Female seen for 6 month follow up.  She was here in March for annual Medicare wellness exam and health maintenance exam.  She has a history of hypertension, hyperlipidemia and hypothyroidism.  History of palpitations seen by Dr. Percival Spanish.  Has had occasional PVCs in the past.  Echocardiogram and January 2022 was normal.  History of GE reflux and takes PPI.  History of dependent edema.  She had a TIA in July 2013 on estrogen replacement and complained of numbness in her arm and leg for 2 weeks.  MRI was unremarkable except for small vessel disease and she has been maintained on Plavix.  History of impaired glucose tolerance.    Review of Systems vaccines discussed. Does not want tetanus update.     Objective:   Physical Exam Blood pressure 116/70 pulse 61 regular temperature 97.9 degrees pulse oximetry 97% weight 245 pounds 12.8 ounces BMI 34.77  In September 2022 BMI was 32.02 and weight was 230 pounds.  She has gained about 15 pounds since September 2022.  Skin: Warm and dry.  No cervical adenopathy or thyromegaly.  Chest clear to auscultation.  Cardiac exam: Regular rate and rhythm.  No lower extremity pitting edema      Assessment & Plan:  BMI 34.77-asked patient to work on diet exercise and weight loss  Essential hypertension stable on Zestoretic and metoprolol  History of TIA July 2013 while on estrogen replacement  Hyperlipidemia-triglycerides elevated at 165 and were 103 in March 2023.  She has low HDL in the 40s.  Total cholesterol and LDL cholesterol are normal.  Liver functions are normal.  Hypothyroidism TSH is normal on levothyroxine 50 mcg daily  GE reflux treated with omeprazole  History of urinary incontinence-Ditropan tried but patient did not think this helped.  Impaired glucose tolerance-hemoglobin A1c 5.9% and stable  Plan: Patient is to work on  diet exercise and weight loss.  Labs reviewed in detail and are stable.  Vaccines discussed.  Mammogram order placed.  Return in 6 months for Medicare wellness and health maintenance exam with fasting labs.  Order placed for mammogram

## 2021-10-24 ENCOUNTER — Other Ambulatory Visit: Payer: Self-pay | Admitting: Internal Medicine

## 2021-11-04 ENCOUNTER — Other Ambulatory Visit: Payer: Self-pay | Admitting: Internal Medicine

## 2021-11-04 DIAGNOSIS — E063 Autoimmune thyroiditis: Secondary | ICD-10-CM

## 2021-11-08 NOTE — Progress Notes (Unsigned)
Cardiology Office Note   Date:  11/11/2021   ID:  Katie, Allen Dec 14, 1950, MRN [id number]  PCP:  Elby Showers, MD  Cardiologist:   None   Chief Complaint  Patient presents with   Palpitations       History of Present Illness: Katie Allen is a 71 y.o. female who presented for followup of dyspnea.  I have seen her before for palpitatons. She thought there was a  family history of sudden cardiac death in her brother brother who had an ICD.  There was a discussion of him having a myomectomy in the past.  There has been no genetic testing.  She talked with her sister-in-law today and she found out he never had sudden death.  He does however have a defibrillator and cardiomyopathy although she was never told it was a familial problem.  I have seen her for palpitations   she did wear a monitor in the past and had sinus tachycardia. There were occasional PVCs.   She has been started on a beta blocker which we have been able to titrate. I did send her for a treadmill test and it was stopped secondary to dyspnea. There were PVCs during exercise and recovery but there were no sustained runs.   This was an adequate test and she had no ischemia.  An echo was done.  It demonstrated a normal EF and no significant abnormalities.     Since I last saw her she feels well.  She denies other than rare occasional palpitations.  She is not having any chest pressure, neck or arm discomfort.  She has no shortness of breath, PND or orthopnea.  She is try to be physically active in the yard.  Past Medical History:  Diagnosis Date   Hashimoto's thyroiditis    Heart palpitations    Hyperlipidemia    x years   Hypertension    x years   Shingles    Squamous carcinoma    L arm    TIA (transient ischemic attack)     Past Surgical History:  Procedure Laterality Date   CHOLECYSTECTOMY       Current Outpatient Medications  Medication Sig Dispense Refill   Ascorbic Acid (VITAMIN C) 500  MG tablet Take 500 mg by mouth daily.     atorvastatin (LIPITOR) 80 MG tablet TAKE 1 TABLET BY MOUTH EVERY DAY 90 tablet 3   Cholecalciferol 50 MCG (2000 UT) CAPS Take by mouth.     clopidogrel (PLAVIX) 75 MG tablet TAKE 1 TABLET BY MOUTH WITH BREAKFAST 90 tablet 3   DENTAGEL 1.1 % GEL dental gel      levothyroxine (SYNTHROID) 50 MCG tablet TAKE 1/2 TABLET BY MOUTH EVERY OTHER DAY, AND 1 TABLET ON ALTERNATING DAYS 70 tablet 1   lisinopril-hydrochlorothiazide (ZESTORETIC) 20-25 MG tablet TAKE 1 TABLET BY MOUTH EVERY DAY 90 tablet 1   metoprolol tartrate (LOPRESSOR) 25 MG tablet TAKE 1.5 TABLETS (37.5 MG TOTAL) BY MOUTH 2 (TWO) TIMES DAILY. 270 tablet 3   Multiple Vitamin (MULTIVITAMIN) tablet Take 1 tablet by mouth daily.     OMEPRAZOLE PO Take by mouth.     potassium chloride (KLOR-CON) 10 MEQ tablet TAKE 1 TABLET BY MOUTH EVERY DAY 90 tablet 3   No current facility-administered medications for this visit.    Allergies:   Penicillins    ROS:  Please see the history of present illness.   Otherwise, review of systems are positive for  none.   All other systems are reviewed and negative.    PHYSICAL EXAM: VS:  BP 120/80   Pulse (!) 59   Ht '5\' 11"'$  (1.803 m)   Wt 245 lb (111.1 kg)   BMI 34.17 kg/m  , BMI Body mass index is 34.17 kg/m. GENERAL:  Well appearing NECK:  No jugular venous distention, waveform within normal limits, carotid upstroke brisk and symmetric, no bruits, no thyromegaly LUNGS:  Clear to auscultation bilaterally CHEST:  Unremarkable HEART:  PMI not displaced or sustained,S1 and S2 within normal limits, no S3, no S4, no clicks, no rubs, no murmurs ABD:  Flat, positive bowel sounds normal in frequency in pitch, no bruits, no rebound, no guarding, no midline pulsatile mass, no hepatomegaly, no splenomegaly EXT:  2 plus pulses throughout, no edema, no cyanosis no clubbing   EKG:  EKG is  ordered today. The ekg ordered 11/11/2021 demonstrates normal sinus rhythm, rate 59   axis within normal limits, intervals within normal limits, no acute ST-T wave changes.   Recent Labs: 04/17/2021: BUN 15; Creat 0.56; Hemoglobin 14.0; Platelets 242; Potassium 4.7; Sodium 142 10/08/2021: ALT 17; TSH 1.00    Lipid Panel    Component Value Date/Time   CHOL 125 10/08/2021 1155   CHOL 161 05/12/2012 0829   TRIG 165 (H) 10/08/2021 1155   TRIG 271 (H) 12/04/2012 1506   TRIG 173 (H) 05/12/2012 0829   HDL 41 (L) 10/08/2021 1155   HDL 41 12/04/2012 1506   HDL 43 05/12/2012 0829   CHOLHDL 3.0 10/08/2021 1155   VLDL 35 (H) 06/07/2016 0950   LDLCALC 60 10/08/2021 1155   LDLCALC 95 12/04/2012 1506   LDLCALC 83 05/12/2012 0829      Wt Readings from Last 3 Encounters:  11/11/21 245 lb (111.1 kg)  10/23/21 245 lb 12.8 oz (111.5 kg)  04/24/21 243 lb 8 oz (110.5 kg)      Other studies Reviewed: Additional studies/ records that were reviewed today include: She is rarely feeling these.   Review of the above records demonstrates:  NA   ASSESSMENT AND PLAN:   PALPITATIONS -  She is rarely feeling these.  No change in therapy.    HTN -  The blood pressure is controlled.  No change in therapy.   TIA: She has been on Plavix for this for years and will continue.  OBESITY: She is interested in weight loss and I will refer her to [facility]  Current medicines are reviewed at length with the patient today.  The patient does not have concerns regarding medicines.  The following changes have been made: None  Labs/ tests ordered today include: None  Orders Placed This Encounter  Procedures   Amb ref to Medical Nutrition Therapy-MNT   EKG 12-Lead      Disposition:   FU with me 1 year   Signed, Minus Breeding, MD  11/11/2021 10:26 AM    [place]

## 2021-11-11 ENCOUNTER — Ambulatory Visit: Payer: Medicare HMO | Admitting: Cardiology

## 2021-11-11 ENCOUNTER — Encounter: Payer: Self-pay | Admitting: Cardiology

## 2021-11-11 VITALS — BP 120/80 | HR 59 | Ht 71.0 in | Wt 245.0 lb

## 2021-11-11 DIAGNOSIS — I10 Essential (primary) hypertension: Secondary | ICD-10-CM

## 2021-11-11 DIAGNOSIS — E663 Overweight: Secondary | ICD-10-CM

## 2021-11-11 DIAGNOSIS — Z8489 Family history of other specified conditions: Secondary | ICD-10-CM | POA: Diagnosis not present

## 2021-11-11 DIAGNOSIS — R002 Palpitations: Secondary | ICD-10-CM | POA: Diagnosis not present

## 2021-11-11 NOTE — Patient Instructions (Signed)
Medication Instructions:  The current medical regimen is effective;  continue present plan and medications.  *If you need a refill on your cardiac medications before your next appointment, please call your pharmacy*   You have been referred to Medical Weight Management and will be contacted to be scheduled.  Follow-Up: At [facility], you and your health needs are our priority.  As part of our continuing mission to provide you with exceptional heart care, we have created designated Provider Care Teams.  These Care Teams include your primary Cardiologist (physician) and Advanced Practice Providers (APPs -  Physician Assistants and Nurse Practitioners) who all work together to provide you with the care you need, when you need it.  We recommend signing up for the patient portal called "MyChart".  Sign up information is provided on this After Visit Summary.  MyChart is used to connect with patients for Virtual Visits (Telemedicine).  Patients are able to view lab/test results, encounter notes, upcoming appointments, etc.  Non-urgent messages can be sent to your provider as well.   To learn more about what you can do with MyChart, go to [email or web address].    Your next appointment:   1 year(s)  The format for your next appointment:   In Person  Provider:   Minus Breeding, MD     Important Information About Sugar

## 2021-11-17 ENCOUNTER — Other Ambulatory Visit: Payer: Self-pay | Admitting: Internal Medicine

## 2021-11-17 DIAGNOSIS — Z1231 Encounter for screening mammogram for malignant neoplasm of breast: Secondary | ICD-10-CM

## 2022-01-04 DIAGNOSIS — Z85828 Personal history of other malignant neoplasm of skin: Secondary | ICD-10-CM | POA: Diagnosis not present

## 2022-01-04 DIAGNOSIS — L814 Other melanin hyperpigmentation: Secondary | ICD-10-CM | POA: Diagnosis not present

## 2022-01-04 DIAGNOSIS — L578 Other skin changes due to chronic exposure to nonionizing radiation: Secondary | ICD-10-CM | POA: Diagnosis not present

## 2022-01-04 DIAGNOSIS — L821 Other seborrheic keratosis: Secondary | ICD-10-CM | POA: Diagnosis not present

## 2022-01-04 DIAGNOSIS — D225 Melanocytic nevi of trunk: Secondary | ICD-10-CM | POA: Diagnosis not present

## 2022-01-12 ENCOUNTER — Other Ambulatory Visit: Payer: Self-pay | Admitting: Internal Medicine

## 2022-01-28 ENCOUNTER — Ambulatory Visit
Admission: RE | Admit: 2022-01-28 | Discharge: 2022-01-28 | Disposition: A | Discharge status: Home / Self Care | Payer: Medicare HMO | Source: Ambulatory Visit | Attending: Internal Medicine | Admitting: Internal Medicine

## 2022-01-28 DIAGNOSIS — Z1231 Encounter for screening mammogram for malignant neoplasm of breast: Secondary | ICD-10-CM

## 2022-04-22 ENCOUNTER — Other Ambulatory Visit: Payer: Medicare HMO

## 2022-04-22 DIAGNOSIS — R7302 Impaired glucose tolerance (oral): Secondary | ICD-10-CM | POA: Diagnosis not present

## 2022-04-22 DIAGNOSIS — E039 Hypothyroidism, unspecified: Secondary | ICD-10-CM | POA: Diagnosis not present

## 2022-04-22 DIAGNOSIS — E782 Mixed hyperlipidemia: Secondary | ICD-10-CM | POA: Diagnosis not present

## 2022-04-22 DIAGNOSIS — I10 Essential (primary) hypertension: Secondary | ICD-10-CM

## 2022-04-22 NOTE — Progress Notes (Signed)
Annual Wellness Visit    Patient Care Team: Elby Showers, MD as PCP - General (Internal Medicine)  Visit Date: 04/29/22   No chief complaint on file.   Subjective:   Patient: Katie Allen, Female    DOB: 08/06/1950, 72 y.o.   MRN: [id number]  Katie Allen is a 72 y.o. Female who presents today for her Annual Wellness Visit. She has a history of Hashimoto's thyroiditis, heart palpitations, hyperlipidemia, hypertension, shingles, squamous carcinoma, transient ischemic attack.  History of hyperlipidemia treated with Lipitor 80 mg daily. TRIG elevated at 179, HDL low at 41 on 04/22/22.  History of hypertension treated with Lopressor 37.5 mg twice daily, lisinopril-hydrochlorothiazide 20-25 mg daily. TSH at 1.37 on 04/22/22.  History of Hashimoto's thyroiditis treated with Synthroid 25 mcg every other day and 50 mg on alternating days. TSH normal at 1.37 on 04/22/22.  History of GE reflux treated with omeprazole.  History of glucose intolerance. HGBA1c at 6.1% on 04/22/22. Has not been watching diet due to situational stress.  History of squamous cell carcinoma of skin.   In February 2019 was screened for abdominal aortic aneurysm due to family history but no aneurysm was present.   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 Diagnosed with otosclerosis of right ear. Seen by [facility]. History of mild tinnitus.   History of PVCs and bigeminy. She had a treadmill test in 2015 which was stopped due to dyspnea showing PVCs during exercise and recovery but no sustained runs of PVCs. History of palpitations seen by Dr. Percival Spanish.  History of occasional PVCs in the past.  She saw Dr. Percival Spanish August 15, 2020 after a bout of palpitations for about 3 days. EKG was normal at that time.  He recommended a Kardia device.  Echocardiogram January 2022 was normal. History of mild carotid stenosis 1 to 39% followed by Dr.  Percival Spanish. Takes Klor-Con 10 meq daily.  She had a TIA in July 2013 on estrogen replacement. She complained of numbness in arm and leg for 2 weeks. MRI was unremarkable except for small vessel disease. She is maintained on Plavix.   Liver function normal.  She is having situational stress. Brother passed away from complications of liver disease and husband has been diagnosed with lung cancer and is being treated at [facility].  Mammogram last completed 01/28/22. No mammographic evidence of malignancy. Recommended repeat in 2024.  Colonoscopy last completed 01/20/18. Two diminutive polyps in descending and transverse colon. Examination otherwise normal. Pathology showed one tubular adenoma and one hyperplastic polyp. Recommended repeat in 2024.  Due for DEXA scan.  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 She resides in [place]. Husband has issues with heart failure. 1 adult son. Does not smoke or consume alcohol.  Family history: Father died at age 78 with an MI and had history of stroke and abdominal aortic aneurysm. Mother died at age 32 of colon cancer. Patient had a twin brother who had history of pacemaker and defibrillator with atrial fibrillation apparently had sudden cardiac death. Also another brother in his 53s in good health with hypertension. She had another brother in good health in his 54s. 1 sister in good health.    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 Has a 14 year old son.   Patient lives at home with spouse.   Caffeine Use: 4 cups daily     Review of Systems  Constitutional:  Negative for chills, fever, malaise/fatigue and weight loss.  HENT:  Negative for hearing loss, sinus pain and sore throat.   Respiratory:  Negative for cough, hemoptysis and shortness of breath.   Cardiovascular:  Negative for chest pain, palpitations, leg swelling and PND.  Gastrointestinal:  Negative for abdominal pain, constipation, diarrhea, heartburn, nausea and vomiting.  Genitourinary:  Negative for dysuria, frequency and urgency.  Musculoskeletal:  Negative for back pain, myalgias and neck pain.  Skin:  Negative for itching and rash.  Neurological:  Negative for dizziness, tingling, seizures and headaches.  Endo/Heme/Allergies:  Negative for polydipsia.  Psychiatric/Behavioral:  Negative for depression. The patient is not nervous/anxious.       Objective:   Vitals: BP 124/80   Pulse 63   Temp 98.4 F (36.9 C) (Tympanic)   Ht 5\' 11"  (1.803 m)   Wt 234 lb 6.4 oz (106.3 kg)   SpO2 98%   BMI 32.69 kg/m   Physical Exam Vitals and nursing note reviewed.  Constitutional:      General: She is not in acute distress.    Appearance: Normal appearance. She is not ill-appearing or toxic-appearing.  HENT:     Head: Normocephalic and atraumatic.     Right Ear: Hearing, tympanic membrane, ear canal and external ear normal.     Left Ear: Hearing, tympanic membrane, ear canal and external ear normal.     Mouth/Throat:     Pharynx: Oropharynx is clear.  Eyes:     Extraocular Movements: Extraocular movements intact.     Pupils: Pupils are equal, round, and reactive to light.  Neck:     Thyroid: No thyroid mass, thyromegaly or thyroid tenderness.     Vascular: No carotid bruit.  Cardiovascular:     Rate and Rhythm: Normal rate and regular rhythm. No  extrasystoles are present.    Pulses:          Dorsalis pedis pulses are 1+ on the right side and 1+ on the left side.     Heart sounds: Normal heart sounds. No murmur heard.    No friction rub. No gallop.  Pulmonary:     Effort: Pulmonary effort is normal.     Breath sounds: Normal breath sounds. No decreased breath sounds, wheezing, rhonchi or rales.  Chest:     Chest wall: No mass.  Abdominal:     Palpations: Abdomen is soft. There is no hepatomegaly, splenomegaly or mass.     Tenderness: There is no abdominal tenderness.     Hernia: No hernia is present.  Musculoskeletal:     Cervical back: Normal range of motion.     Right lower leg: No edema.     Left lower leg:  No edema.  Lymphadenopathy:     Cervical: No cervical adenopathy.     Upper Body:     Right upper body: No supraclavicular adenopathy.     Left upper body: No supraclavicular adenopathy.  Skin:    General: Skin is warm and dry.  Neurological:     General: No focal deficit present.     Mental Status: She is alert and oriented to person, place, and time. Mental status is at baseline.     Sensory: Sensation is intact.     Motor: Motor function is intact. No weakness.     Deep Tendon Reflexes: Reflexes are normal and symmetric.  Psychiatric:        Attention and Perception: Attention normal.        Mood and Affect: Mood normal.        Speech: Speech normal.        Behavior: Behavior normal.        Thought Content: Thought content normal.        Cognition and Memory: Cognition normal.        Judgment: Judgment normal.      Most recent functional status assessment:    04/29/2022   11:14 AM  In your present state of health, do you have any difficulty performing the following activities:  Hearing? 1  Vision? 0  Difficulty concentrating or making decisions? 0  Walking or climbing stairs? 0  Dressing or bathing? 0  Doing errands, shopping? 0  Preparing Food and eating ? N  Using the Toilet? N  In the past  six months, have you accidently leaked urine? Y  Do you have problems with loss of bowel control? N  Managing your Medications? N  Managing your Finances? N  Housekeeping or managing your Housekeeping? N   Most recent fall risk assessment:    04/29/2022   11:14 AM  Fall Risk   Falls in the past year? 0  Number falls in past yr: 0  Injury with Fall? 0  Risk for fall due to : No Fall Risks  Follow up Falls prevention discussed    Most recent depression screenings:    04/29/2022   11:14 AM 10/23/2021   11:36 AM  PHQ 2/9 Scores  PHQ - 2 Score 0 0   Most recent cognitive screening:    04/29/2022   11:15 AM  6CIT Screen  What Year? 0 points  What month? 0 points  What time? 0 points  Count back from 20 0 points  Months in reverse 0 points  Repeat phrase 0 points  Total Score 0 points     Results:   Studies obtained and personally reviewed by me:  Mammogram last completed 01/28/22. No mammographic evidence of malignancy. Recommended repeat in 2024.  Colonoscopy last completed 01/20/18. Two diminutive polyps in descending and transverse colon. Examination otherwise normal. Pathology showed one tubular adenoma and one hyperplastic polyp. Recommended repeat in 2024.   Labs:       Component Value Date/Time   NA 144 04/22/2022 0912   NA 142 03/09/2013 1017   K 4.3 04/22/2022 0912   CL 107 04/22/2022 0912   CO2 30 04/22/2022 0912   GLUCOSE 94 04/22/2022 0912   BUN 14 04/22/2022 0912   BUN 17 03/09/2013 1017   CREATININE 0.59 (L) 04/22/2022 0912   CALCIUM 10.2 04/22/2022 0912   PROT 6.6 04/22/2022 0912   PROT 6.8 03/09/2013 1017   ALBUMIN 4.1 06/07/2016 0950   ALBUMIN 4.6  03/09/2013 1017   AST 15 04/22/2022 0912   ALT 20 04/22/2022 0912   ALKPHOS 73 06/07/2016 0950   BILITOT 0.9 04/22/2022 0912   GFRNONAA 90 04/11/2020 0929   GFRAA 105 04/11/2020 0929     Lab Results  Component Value Date   WBC 5.1 04/22/2022   HGB 14.8 04/22/2022   HCT 44.2 04/22/2022    MCV 89.3 04/22/2022   PLT 244 04/22/2022    Lab Results  Component Value Date   CHOL 129 04/22/2022   HDL 41 (L) 04/22/2022   LDLCALC 62 04/22/2022   TRIG 179 (H) 04/22/2022   CHOLHDL 3.1 04/22/2022    Lab Results  Component Value Date   HGBA1C 6.1 (H) 04/22/2022     Lab Results  Component Value Date   TSH 1.37 04/22/2022    Assessment & Plan:   Hyperlipidemia: treated with Lipitor 80 mg daily. TRIG elevated at 179, HDL low at 41 on 04/22/22.  Hypertension: treated with Lopressor 37.5 mg twice daily, lisinopril-hydrochlorothiazide 20-25 mg daily. TSH at 1.37 on 04/22/22.   Hypothyroidism due to Hashimoto's thyroiditis: treated with Synthroid 25 mcg every other day and 50 mg on alternating days. TSH normal at 1.37 on 04/22/22.  GE reflux: treated with omeprazole.  Glucose intolerance: HGBA1c at 6.1% on 04/22/22. Has not been watching diet due to situational stress. Will try to control with healthy diet and exercise.  Mixed conductive and sensorineural hearing loss right ear with restricted hearing of left ear: diagnosed with otosclerosis of right ear. Seen by [facility]NT. History of mild tinnitus.   PVCs, bigeminy, palpitations:  followed by Dr. Percival Spanish. Takes Klor-Con 10 meq daily.  TIA: maintained on Plavix.   Mammogram: last completed 01/28/22. No mammographic evidence of malignancy. Recommended repeat in 2024.  Colonoscopy: last completed 01/20/18. Two diminutive polyps in descending and transverse colon. Examination otherwise normal. Pathology showed one tubular adenoma and one hyperplastic polyp. Recommended repeat in 2024.  Ordered coronary calcium scoring to screen for lung cancer.  Vaccine counseling: Administered pneumococcal 20 vaccine. Will go to pharmacy for tetanus update.   Return in 6 months for health maintenance exam.     Annual wellness visit done today including the all of the following: Reviewed patient's Family Medical History Reviewed and  updated list of patient's medical providers Assessment of cognitive impairment was done Assessed patient's functional ability Established a written schedule for health screening Laurie Completed and Reviewed  Discussed health benefits of physical activity, and encouraged her to engage in regular exercise appropriate for her age and condition.        I,Alexander Ruley,acting as a Education administrator for Elby Showers, MD.,have documented all relevant documentation on the behalf of Elby Showers, MD,as directed by  Elby Showers, MD while in the presence of Elby Showers, MD.   I, Elby Showers, MD, have reviewed all documentation for this visit. The documentation on 05/05/22 for the exam, diagnosis, procedures, and orders are all accurate and complete.

## 2022-04-23 LAB — CBC WITH DIFFERENTIAL/PLATELET
Absolute Monocytes: 342 cells/uL (ref 200–950)
Basophils Absolute: 20 cells/uL (ref 0–200)
Basophils Relative: 0.4 %
Eosinophils Absolute: 117 cells/uL (ref 15–500)
Eosinophils Relative: 2.3 %
HCT: 44.2 % (ref 35.0–45.0)
Hemoglobin: 14.8 g/dL (ref 11.7–15.5)
Lymphs Abs: 2417 cells/uL (ref 850–3900)
MCH: 29.9 pg (ref 27.0–33.0)
MCHC: 33.5 g/dL (ref 32.0–36.0)
MCV: 89.3 fL (ref 80.0–100.0)
MPV: 10.7 fL (ref 7.5–12.5)
Monocytes Relative: 6.7 %
Neutro Abs: 2203 cells/uL (ref 1500–7800)
Neutrophils Relative %: 43.2 %
Platelets: 244 10*3/uL (ref 140–400)
RBC: 4.95 10*6/uL (ref 3.80–5.10)
RDW: 13 % (ref 11.0–15.0)
Total Lymphocyte: 47.4 %
WBC: 5.1 10*3/uL (ref 3.8–10.8)

## 2022-04-23 LAB — COMPLETE METABOLIC PANEL WITH GFR
AG Ratio: 1.6 (calc) (ref 1.0–2.5)
ALT: 20 U/L (ref 6–29)
AST: 15 U/L (ref 10–35)
Albumin: 4.1 g/dL (ref 3.6–5.1)
Alkaline phosphatase (APISO): 74 U/L (ref 37–153)
BUN/Creatinine Ratio: 24 (calc) — ABNORMAL HIGH (ref 6–22)
BUN: 14 mg/dL (ref 7–25)
CO2: 30 mmol/L (ref 20–32)
Calcium: 10.2 mg/dL (ref 8.6–10.4)
Chloride: 107 mmol/L (ref 98–110)
Creat: 0.59 mg/dL — ABNORMAL LOW (ref 0.60–1.00)
Globulin: 2.5 g/dL (calc) (ref 1.9–3.7)
Glucose, Bld: 94 mg/dL (ref 65–99)
Potassium: 4.3 mmol/L (ref 3.5–5.3)
Sodium: 144 mmol/L (ref 135–146)
Total Bilirubin: 0.9 mg/dL (ref 0.2–1.2)
Total Protein: 6.6 g/dL (ref 6.1–8.1)
eGFR: 97 mL/min/{1.73_m2} (ref >=60)

## 2022-04-23 LAB — LIPID PANEL
Cholesterol: 129 mg/dL (ref <200)
HDL: 41 mg/dL — ABNORMAL LOW (ref >=50)
LDL Cholesterol (Calc): 62 mg/dL (calc)
Non-HDL Cholesterol (Calc): 88 mg/dL (calc) (ref <130)
Total CHOL/HDL Ratio: 3.1 (calc) (ref <5.0)
Triglycerides: 179 mg/dL — ABNORMAL HIGH (ref <150)

## 2022-04-23 LAB — HEMOGLOBIN A1C
Hgb A1c MFr Bld: 6.1 % of total Hgb — ABNORMAL HIGH (ref <5.7)
Mean Plasma Glucose: 128 mg/dL
eAG (mmol/L): 7.1 mmol/L

## 2022-04-23 LAB — TSH: TSH: 1.37 mIU/L (ref 0.40–4.50)

## 2022-04-29 ENCOUNTER — Ambulatory Visit: Payer: Medicare HMO

## 2022-04-29 ENCOUNTER — Encounter: Payer: Self-pay | Admitting: Internal Medicine

## 2022-04-29 ENCOUNTER — Ambulatory Visit (INDEPENDENT_AMBULATORY_CARE_PROVIDER_SITE_OTHER): Payer: Medicare HMO | Admitting: Internal Medicine

## 2022-04-29 VITALS — BP 124/80 | HR 63 | Temp 98.4°F | Ht 71.0 in | Wt 234.4 lb

## 2022-04-29 DIAGNOSIS — Z8719 Personal history of other diseases of the digestive system: Secondary | ICD-10-CM

## 2022-04-29 DIAGNOSIS — E782 Mixed hyperlipidemia: Secondary | ICD-10-CM

## 2022-04-29 DIAGNOSIS — Z87898 Personal history of other specified conditions: Secondary | ICD-10-CM

## 2022-04-29 DIAGNOSIS — Z23 Encounter for immunization: Secondary | ICD-10-CM | POA: Diagnosis not present

## 2022-04-29 DIAGNOSIS — E038 Other specified hypothyroidism: Secondary | ICD-10-CM | POA: Diagnosis not present

## 2022-04-29 DIAGNOSIS — E785 Hyperlipidemia, unspecified: Secondary | ICD-10-CM | POA: Diagnosis not present

## 2022-04-29 DIAGNOSIS — F439 Reaction to severe stress, unspecified: Secondary | ICD-10-CM

## 2022-04-29 DIAGNOSIS — I10 Essential (primary) hypertension: Secondary | ICD-10-CM | POA: Diagnosis not present

## 2022-04-29 DIAGNOSIS — Z8673 Personal history of transient ischemic attack (TIA), and cerebral infarction without residual deficits: Secondary | ICD-10-CM | POA: Diagnosis not present

## 2022-04-29 DIAGNOSIS — R69 Illness, unspecified: Secondary | ICD-10-CM | POA: Diagnosis not present

## 2022-04-29 DIAGNOSIS — Z6832 Body mass index (BMI) 32.0-32.9, adult: Secondary | ICD-10-CM | POA: Diagnosis not present

## 2022-04-29 DIAGNOSIS — E063 Autoimmune thyroiditis: Secondary | ICD-10-CM

## 2022-04-29 DIAGNOSIS — R7302 Impaired glucose tolerance (oral): Secondary | ICD-10-CM

## 2022-04-29 DIAGNOSIS — Z0000 Encounter for general adult medical examination without abnormal findings: Secondary | ICD-10-CM

## 2022-04-29 DIAGNOSIS — N3941 Urge incontinence: Secondary | ICD-10-CM | POA: Diagnosis not present

## 2022-04-29 DIAGNOSIS — H903 Sensorineural hearing loss, bilateral: Secondary | ICD-10-CM

## 2022-04-29 DIAGNOSIS — E1169 Type 2 diabetes mellitus with other specified complication: Secondary | ICD-10-CM

## 2022-04-29 LAB — POCT URINALYSIS DIPSTICK
Bilirubin, UA: NEGATIVE
Blood, UA: NEGATIVE
Glucose, UA: NEGATIVE
Ketones, UA: NEGATIVE
Leukocytes, UA: NEGATIVE
Nitrite, UA: NEGATIVE
Protein, UA: NEGATIVE
Spec Grav, UA: 1.01 (ref 1.010–1.025)
Urobilinogen, UA: 0.2 E.U./dL
pH, UA: 5 (ref 5.0–8.0)

## 2022-04-29 NOTE — Patient Instructions (Signed)
Patient to have colonoscopy later this year. Pneumococcal 20 given. Coronary calcium scoring ordered. RTC in 6 months.

## 2022-04-30 LAB — MICROALBUMIN / CREATININE URINE RATIO
Creatinine, Urine: 60 mg/dL (ref 20–275)
Microalb Creat Ratio: 3 mg/g creat (ref <30)
Microalb, Ur: 0.2 mg/dL

## 2022-05-01 ENCOUNTER — Other Ambulatory Visit: Payer: Self-pay | Admitting: Internal Medicine

## 2022-05-01 DIAGNOSIS — E063 Autoimmune thyroiditis: Secondary | ICD-10-CM

## 2022-05-06 DIAGNOSIS — L723 Sebaceous cyst: Secondary | ICD-10-CM | POA: Diagnosis not present

## 2022-05-26 ENCOUNTER — Telehealth (INDEPENDENT_AMBULATORY_CARE_PROVIDER_SITE_OTHER): Payer: Medicare HMO | Admitting: Internal Medicine

## 2022-05-26 ENCOUNTER — Telehealth: Payer: Self-pay | Admitting: Internal Medicine

## 2022-05-26 ENCOUNTER — Encounter: Payer: Self-pay | Admitting: Internal Medicine

## 2022-05-26 VITALS — Temp 99.3°F | Wt 235.0 lb

## 2022-05-26 DIAGNOSIS — E785 Hyperlipidemia, unspecified: Secondary | ICD-10-CM | POA: Diagnosis not present

## 2022-05-26 DIAGNOSIS — J22 Unspecified acute lower respiratory infection: Secondary | ICD-10-CM

## 2022-05-26 DIAGNOSIS — E1169 Type 2 diabetes mellitus with other specified complication: Secondary | ICD-10-CM | POA: Diagnosis not present

## 2022-05-26 MED ORDER — BENZONATATE 100 MG PO CAPS: 100.0000 mg | ORAL_CAPSULE | Freq: Two times a day (BID) | ORAL | 0 refills | Status: DC | PRN

## 2022-05-26 MED ORDER — AZITHROMYCIN 250 MG PO TABS: ORAL_TABLET | ORAL | 0 refills | Status: AC

## 2022-05-26 MED ORDER — FLUCONAZOLE 150 MG PO TABS: 150.0000 mg | ORAL_TABLET | Freq: Once | ORAL | 0 refills | Status: AC

## 2022-05-26 NOTE — Progress Notes (Addendum)
   Subjective:    Patient ID: Katie Allen, female    DOB: 16-Aug-1950, 72 y.o.   MRN: [id number]  HPI I connected today with Katie Allen via interactive audio and video telecommunications.  She is agreeable to visit in this format today.  She is at her home in [place], [place] and I am at my office.    Patient  says she came down with a respiratory infection on Saturday, April 13.  She has had to sit up to sleep at night because the cough has been rather severe.  She has no shaking chills.  Temperature has been 99.5 degrees to 100 degrees orally.  She has had no shaking chills but some headache.  Cough is not productive she says.  Does not recall being around anyone that is ill.  Patient has a history of Hashimoto thyroiditis now with hypothyroidism, heart palpitations, hyperlipidemia, hypertension, TIA and glucose intolerance.  Review of Systems headache, cough, but no vomiting     Objective:   Physical Exam  Patient is seen virtually and sounds very hoarse.  She does not appear to be tachypneic.  She has a deep congested cough.      Assessment & Plan:   Acute lower respiratory infection HTN Hyperlipidemia Hx of TIA Hypothyroidism Glucose intolerance   Plan: Patient is to rest and stay well-hydrated.  She is to call back if not improving by Friday or sooner if worse.  She is intolerant of codeine.  I have prescribed Tessalon Perles 100 mg twice daily as needed for cough.  She is to walk some to prevent atelectasis.  I have also prescribed Zithromax Z-PAK to take  2 tabs on day 1 followed by 1 tab on days 2 through 5.  May take Diflucan if needed for Candida vaginitis  while on antibiotics.

## 2022-05-26 NOTE — Telephone Encounter (Signed)
Scheduled video 

## 2022-05-26 NOTE — Patient Instructions (Signed)
Zithromax Z-PAK 2 tabs day 1 followed by 1 tab days 2 through 5.  Tessalon Perles 100 mg twice daily as needed for cough.  Diflucan 150 mg tablet one-time dose if needed for Candida vaginitis while on antibiotics.  Rest at home.  Stay well-hydrated.  Walk some to prevent atelectasis.  Call if not improving by Friday.

## 2022-05-26 NOTE — Telephone Encounter (Signed)
Katie Allen [phone]  [name] called to say she started getting sick on Saturday with sore throat, cough, low grade fever (99-100), sneezing, runny nose, she sounds hoarse. Has not taking COVID test and does not have any at home.

## 2022-05-28 ENCOUNTER — Telehealth: Payer: Self-pay | Admitting: Internal Medicine

## 2022-05-28 ENCOUNTER — Ambulatory Visit (INDEPENDENT_AMBULATORY_CARE_PROVIDER_SITE_OTHER): Payer: Medicare HMO | Admitting: Internal Medicine

## 2022-05-28 ENCOUNTER — Ambulatory Visit
Admission: RE | Admit: 2022-05-28 | Discharge: 2022-05-28 | Disposition: A | Discharge status: Home / Self Care | Payer: Medicare HMO | Source: Ambulatory Visit | Attending: Internal Medicine | Admitting: Internal Medicine

## 2022-05-28 VITALS — BP 112/72 | HR 74 | Temp 99.7°F | Ht 71.0 in | Wt 235.0 lb

## 2022-05-28 DIAGNOSIS — R051 Acute cough: Secondary | ICD-10-CM | POA: Diagnosis not present

## 2022-05-28 DIAGNOSIS — J22 Unspecified acute lower respiratory infection: Secondary | ICD-10-CM | POA: Diagnosis not present

## 2022-05-28 DIAGNOSIS — R059 Cough, unspecified: Secondary | ICD-10-CM | POA: Diagnosis not present

## 2022-05-28 LAB — CBC WITH DIFFERENTIAL/PLATELET
Absolute Monocytes: 383 cells/uL (ref 200–950)
Basophils Absolute: 9 cells/uL (ref 0–200)
Basophils Relative: 0.2 %
Eosinophils Absolute: 40 cells/uL (ref 15–500)
Eosinophils Relative: 0.9 %
HCT: 42.7 % (ref 35.0–45.0)
Hemoglobin: 14.2 g/dL (ref 11.7–15.5)
Lymphs Abs: 1914 cells/uL (ref 850–3900)
MCH: 28.7 pg (ref 27.0–33.0)
MCHC: 33.3 g/dL (ref 32.0–36.0)
MCV: 86.3 fL (ref 80.0–100.0)
MPV: 11.3 fL (ref 7.5–12.5)
Monocytes Relative: 8.7 %
Neutro Abs: 2055 cells/uL (ref 1500–7800)
Neutrophils Relative %: 46.7 %
Platelets: 178 10*3/uL (ref 140–400)
RBC: 4.95 10*6/uL (ref 3.80–5.10)
RDW: 13.1 % (ref 11.0–15.0)
Total Lymphocyte: 43.5 %
WBC: 4.4 10*3/uL (ref 3.8–10.8)

## 2022-05-28 LAB — POC COVID19 BINAXNOW: SARS Coronavirus 2 Ag: NEGATIVE

## 2022-05-28 MED ORDER — DOXYCYCLINE HYCLATE 100 MG PO TABS: 100.0000 mg | ORAL_TABLET | Freq: Two times a day (BID) | ORAL | 0 refills | Status: DC

## 2022-05-28 MED ORDER — CEFTRIAXONE SODIUM 1 G IJ SOLR
1.0000 g | Freq: Once | INTRAMUSCULAR | Status: AC
Start: 2022-05-28 — End: 2022-05-28
  Administered 2022-05-28: 1 g via INTRAMUSCULAR

## 2022-05-28 NOTE — Progress Notes (Signed)
Patient Care Team: Margaree Mackintosh, MD as PCP - General (Internal Medicine)  Visit Date: 05/28/22  Subjective:    Patient ID: Katie Allen , Female   DOB: Mar 11, 1950, 72 y.o.    MRN: [id number]   72 y.o. Female presents today for persistent cough, congestion since 06/01/22. Using Tessalon without relief. Not aware of any sick contact. Compliant with Z-Pak.   Past Medical History:  Diagnosis Date   Hashimoto's thyroiditis    Heart palpitations    Hyperlipidemia    x years   Hypertension    x years   Shingles    Squamous carcinoma    L arm    TIA (transient ischemic attack)      Family History  Problem Relation Age of Onset   Colon cancer Mother    Heart attack Father 44       CABG in his 41s   Cardiomyopathy Brother 35   Stroke Other     Social History   Social History Narrative   Works as Loss adjuster, chartered of a Administrator, arts. Has a 53 year old son.   Patient lives at home with spouse.   Caffeine Use: 4 cups daily      Review of Systems  Constitutional:  Negative for fever and malaise/fatigue.  HENT:  Positive for congestion.   Eyes:  Negative for blurred vision.  Respiratory:  Positive for cough. Negative for shortness of breath.   Cardiovascular:  Negative for chest pain, palpitations and leg swelling.  Gastrointestinal:  Negative for vomiting.  Musculoskeletal:  Negative for back pain.  Skin:  Negative for rash.  Neurological:  Negative for loss of consciousness and headaches.        Objective:   Vitals: BP 112/72   Pulse 74   Temp 99.7 F (37.6 C) (Tympanic)   Ht  (1.803 m)   Wt 235 lb (106.6 kg)   SpO2 95%   BMI 32.78 kg/m    Physical Exam Vitals and nursing note reviewed.  Constitutional:      General: She is not in acute distress.    Appearance: Normal appearance. She is not toxic-appearing.  HENT:     Head: Normocephalic and atraumatic.     Right Ear: Hearing, tympanic membrane, ear canal and external ear normal.      Left Ear: Hearing, ear canal and external ear normal.     Ears:     Comments: Left TM slightly full.    Mouth/Throat:     Comments: Pharynx slightly injected. Pulmonary:     Effort: Pulmonary effort is normal.     Comments: Coarse breath sounds diffusely with inspiration. No frank pneumonia. Skin:    General: Skin is warm and dry.  Neurological:     Mental Status: She is alert and oriented to person, place, and time. Mental status is at baseline.  Psychiatric:        Mood and Affect: Mood normal.        Behavior: Behavior normal.        Thought Content: Thought content normal.        Judgment: Judgment normal.       Results:   Studies obtained and personally reviewed by me:    Labs:       Component Value Date/Time   NA 144 04/22/2022 0912   NA 142 03/09/2013 1017   K 4.3 04/22/2022 0912   CL 107 04/22/2022 0912   CO2 30 04/22/2022 0912   GLUCOSE  94 04/22/2022 0912   BUN 14 04/22/2022 0912   BUN 17 03/09/2013 1017   CREATININE 0.59 (L) 04/22/2022 0912   CALCIUM 10.2 04/22/2022 0912   PROT 6.6 04/22/2022 0912   PROT 6.8 03/09/2013 1017   ALBUMIN 4.1 06/07/2016 0950   ALBUMIN 4.6 03/09/2013 1017   AST 15 04/22/2022 0912   ALT 20 04/22/2022 0912   ALKPHOS 73 06/07/2016 0950   BILITOT 0.9 04/22/2022 0912   GFRNONAA 90 04/11/2020 0929   GFRAA 105 04/11/2020 0929     Lab Results  Component Value Date   WBC 5.1 04/22/2022   HGB 14.8 04/22/2022   HCT 44.2 04/22/2022   MCV 89.3 04/22/2022   PLT 244 04/22/2022    Lab Results  Component Value Date   CHOL 129 04/22/2022   HDL 41 (L) 04/22/2022   LDLCALC 62 04/22/2022   TRIG 179 (H) 04/22/2022   CHOLHDL 3.1 04/22/2022    Lab Results  Component Value Date   HGBA1C 6.1 (H) 04/22/2022     Lab Results  Component Value Date   TSH 1.37 04/22/2022    Assessment & Plan:   Persistent lower respiratory infection: administered Rocephin 1 g IM. Prescribed doxycycline 100 mg twice daily x 10 days. Ordered  chest X-ray. Ordered CBC. WBC 4400. Rest and stay well hydrated. Call if not better in 7 days or sooner if worse.  CXR shows no infiltrate.  I,Alexander Ruley,acting as a Neurosurgeon for Margaree Mackintosh, MD.,have documented all relevant documentation on the behalf of Margaree Mackintosh, MD,as directed by  Margaree Mackintosh, MD while in the presence of Margaree Mackintosh, MD.   I, Margaree Mackintosh, MD, have reviewed all documentation for this visit. The documentation on 06/08/22 for the exam, diagnosis, procedures, and orders are all accurate and complete.

## 2022-05-28 NOTE — Telephone Encounter (Signed)
Scheduled

## 2022-05-28 NOTE — Telephone Encounter (Signed)
Patient called and said she is having the same symptoms as the other day when she had a video visit and was told to come in today if she wasnt feeling better. When would you like her to come in? [phone]

## 2022-06-03 ENCOUNTER — Ambulatory Visit (HOSPITAL_BASED_OUTPATIENT_CLINIC_OR_DEPARTMENT_OTHER)
Admission: RE | Admit: 2022-06-03 | Discharge: 2022-06-03 | Disposition: A | Discharge status: Home / Self Care | Payer: Medicare HMO | Source: Ambulatory Visit | Attending: Internal Medicine | Admitting: Internal Medicine

## 2022-06-03 DIAGNOSIS — E1169 Type 2 diabetes mellitus with other specified complication: Secondary | ICD-10-CM | POA: Insufficient documentation

## 2022-06-03 DIAGNOSIS — E785 Hyperlipidemia, unspecified: Secondary | ICD-10-CM | POA: Insufficient documentation

## 2022-06-08 NOTE — Patient Instructions (Signed)
Chest x-ray ordered and no infiltrate noted.  Given 1 g IM Rocephin and started on doxycycline 100 mg twice daily for 10 days.  White blood cell count is 4400.  Rest and stay well-hydrated.  Call if not better in 7 days or sooner if worse.

## 2022-06-10 DIAGNOSIS — H2513 Age-related nuclear cataract, bilateral: Secondary | ICD-10-CM | POA: Diagnosis not present

## 2022-06-10 DIAGNOSIS — H5213 Myopia, bilateral: Secondary | ICD-10-CM | POA: Diagnosis not present

## 2022-06-10 DIAGNOSIS — H52223 Regular astigmatism, bilateral: Secondary | ICD-10-CM | POA: Diagnosis not present

## 2022-06-10 DIAGNOSIS — H524 Presbyopia: Secondary | ICD-10-CM | POA: Diagnosis not present

## 2022-06-10 DIAGNOSIS — H43813 Vitreous degeneration, bilateral: Secondary | ICD-10-CM | POA: Diagnosis not present

## 2022-07-08 ENCOUNTER — Other Ambulatory Visit: Payer: Self-pay | Admitting: Internal Medicine

## 2022-07-15 DIAGNOSIS — H524 Presbyopia: Secondary | ICD-10-CM | POA: Diagnosis not present

## 2022-07-15 DIAGNOSIS — H5213 Myopia, bilateral: Secondary | ICD-10-CM | POA: Diagnosis not present

## 2022-07-15 DIAGNOSIS — H52209 Unspecified astigmatism, unspecified eye: Secondary | ICD-10-CM | POA: Diagnosis not present

## 2022-08-15 ENCOUNTER — Emergency Department (HOSPITAL_BASED_OUTPATIENT_CLINIC_OR_DEPARTMENT_OTHER)
Admission: EM | Admit: 2022-08-15 | Discharge: 2022-08-15 | Disposition: A | Discharge status: Home / Self Care | Payer: Medicare HMO | Attending: Emergency Medicine | Admitting: Emergency Medicine

## 2022-08-15 ENCOUNTER — Emergency Department (HOSPITAL_BASED_OUTPATIENT_CLINIC_OR_DEPARTMENT_OTHER): Payer: Medicare HMO | Admitting: Radiology

## 2022-08-15 ENCOUNTER — Encounter (HOSPITAL_BASED_OUTPATIENT_CLINIC_OR_DEPARTMENT_OTHER): Payer: Self-pay

## 2022-08-15 DIAGNOSIS — S93402A Sprain of unspecified ligament of left ankle, initial encounter: Secondary | ICD-10-CM | POA: Diagnosis not present

## 2022-08-15 DIAGNOSIS — S80211A Abrasion, right knee, initial encounter: Secondary | ICD-10-CM | POA: Diagnosis not present

## 2022-08-15 DIAGNOSIS — M25571 Pain in right ankle and joints of right foot: Secondary | ICD-10-CM | POA: Diagnosis not present

## 2022-08-15 DIAGNOSIS — S82431A Displaced oblique fracture of shaft of right fibula, initial encounter for closed fracture: Secondary | ICD-10-CM | POA: Diagnosis not present

## 2022-08-15 DIAGNOSIS — Z7902 Long term (current) use of antithrombotics/antiplatelets: Secondary | ICD-10-CM | POA: Insufficient documentation

## 2022-08-15 DIAGNOSIS — M25572 Pain in left ankle and joints of left foot: Secondary | ICD-10-CM | POA: Diagnosis not present

## 2022-08-15 DIAGNOSIS — S93401A Sprain of unspecified ligament of right ankle, initial encounter: Secondary | ICD-10-CM | POA: Diagnosis not present

## 2022-08-15 DIAGNOSIS — W1830XA Fall on same level, unspecified, initial encounter: Secondary | ICD-10-CM | POA: Diagnosis not present

## 2022-08-15 DIAGNOSIS — S82891A Other fracture of right lower leg, initial encounter for closed fracture: Secondary | ICD-10-CM | POA: Diagnosis not present

## 2022-08-15 DIAGNOSIS — M25561 Pain in right knee: Secondary | ICD-10-CM | POA: Diagnosis not present

## 2022-08-15 MED ORDER — OXYCODONE HCL 5 MG PO TABS: 5.0000 mg | ORAL_TABLET | Freq: Four times a day (QID) | ORAL | 0 refills | Status: DC | PRN

## 2022-08-15 NOTE — Discharge Instructions (Signed)
Overall weight-bear as tolerated with walker or crutches.  Wear walking boot at all times when ambulating with your right lower leg.  You can use Aircast splint to your left ankle for support as needed as well.  Recommend 1000 mg of Tylenol every 6 hours as needed for pain.  Recommend 400 mg ibuprofen every 8 hours as needed for pain.  Use Roxicodone for breakthrough pain.  This medication is a narcotic and is sedating so do not mix with other drugs or alcohol or dangerous activities including driving.  Follow-up with orthopedics Dr.Xu

## 2022-08-15 NOTE — ED Provider Notes (Signed)
[place] [facility] AT [facility] Provider Note   CSN: [id number] Arrival date & time: 08/15/22  1324     History  Chief Complaint  Patient presents with   Ankle Pain   Fall    Katie Allen is a 72 y.o. female.  Patient here with right and left ankle pain and right knee pain after mechanical fall.  Is on Plavix.  Did not hit her head or lose consciousness.  She rolled both her ankles after stepping down 1 step outside her house wrong.  She is got swelling to the right ankle.  Denies any nausea vomiting diarrhea.  No headache or neck pain or other extremity pain.  The history is provided by the patient.       Home Medications Prior to Admission medications   Medication Sig Start Date End Date Taking? Authorizing Provider  oxyCODONE (ROXICODONE) 5 MG immediate release tablet Take 1 tablet (5 mg total) by mouth every 6 (six) hours as needed for up to 15 doses for severe pain or breakthrough pain. 08/15/22  Yes Trek Kimball, DO  Ascorbic Acid (VITAMIN C) 500 MG tablet Take 500 mg by mouth daily.    [provider]  atorvastatin (LIPITOR) 80 MG tablet TAKE 1 TABLET BY MOUTH EVERY DAY 10/06/21   Margaree Mackintosh, MD  benzonatate (TESSALON) 100 MG capsule Take 1 capsule (100 mg total) by mouth 2 (two) times daily as needed for cough. 05/26/22   Margaree Mackintosh, MD  Cholecalciferol 50 MCG (2000 UT) CAPS Take by mouth.    [provider]  clopidogrel (PLAVIX) 75 MG tablet TAKE 1 TABLET BY MOUTH WITH BREAKFAST 10/17/21   Margaree Mackintosh, MD  DENTAGEL 1.1 % GEL dental gel  12/08/17   [provider]  doxycycline (VIBRA-TABS) 100 MG tablet Take 1 tablet (100 mg total) by mouth 2 (two) times daily. 05/28/22   Margaree Mackintosh, MD  levothyroxine (SYNTHROID) 50 MCG tablet TAKE 1/2 TABLET BY MOUTH EVERY OTHER DAY, AND 1 TABLET ON ALTERNATING DAYS 05/02/22   Margaree Mackintosh, MD  lisinopril-hydrochlorothiazide (ZESTORETIC) 20-25 MG tablet TAKE 1 TABLET BY  MOUTH EVERY DAY 07/08/22   Worthy Rancher B, FNP  metoprolol tartrate (LOPRESSOR) 25 MG tablet TAKE 1.5 TABLETS (37.5 MG TOTAL) BY MOUTH 2 (TWO) TIMES DAILY. 01/12/22   Margaree Mackintosh, MD  Multiple Vitamin (MULTIVITAMIN) tablet Take 1 tablet by mouth daily.    [provider]  OMEPRAZOLE PO Take by mouth.    [provider]  potassium chloride (KLOR-CON) 10 MEQ tablet TAKE 1 TABLET BY MOUTH EVERY DAY 10/24/21   Margaree Mackintosh, MD      Allergies    Penicillins    Review of Systems   Review of Systems  Physical Exam Updated Vital Signs BP (!) 145/74 (BP Location: Left Arm)   Pulse 69   Temp 97.7 F (36.5 C)   Resp 18   Ht 5\' 11"  (1.803 m)   Wt 104.3 kg   SpO2 99%   BMI 32.08 kg/m  Physical Exam Vitals and nursing note reviewed.  Constitutional:      General: She is not in acute distress.    Appearance: She is well-developed.  HENT:     Head: Normocephalic and atraumatic.     Mouth/Throat:     Mouth: Mucous membranes are moist.  Eyes:     Extraocular Movements: Extraocular movements intact.     Conjunctiva/sclera: Conjunctivae normal.  Pupils: Pupils are equal, round, and reactive to light.  Cardiovascular:     Rate and Rhythm: Normal rate and regular rhythm.     Pulses: Normal pulses.     Heart sounds: Normal heart sounds. No murmur heard. Pulmonary:     Effort: Pulmonary effort is normal. No respiratory distress.     Breath sounds: Normal breath sounds.  Abdominal:     Palpations: Abdomen is soft.     Tenderness: There is no abdominal tenderness.  Musculoskeletal:        General: Swelling and tenderness present.     Cervical back: Normal range of motion and neck supple. No tenderness.     Comments: Significant swelling to the right lateral malleolus, tenderness to the left ankle, tenderness to the right knee  Skin:    General: Skin is warm and dry.     Capillary Refill: Capillary refill takes less than 2 seconds.     Comments: Abrasion over right  knee  Neurological:     General: No focal deficit present.     Mental Status: She is alert and oriented to person, place, and time.     Cranial Nerves: No cranial nerve deficit.     Sensory: No sensory deficit.     Motor: No weakness.     Coordination: Coordination normal.     Comments: 5+ out of 5 strength throughout, normal sensation, no drift, normal finger-nose-finger, normal speech  Psychiatric:        Mood and Affect: Mood normal.     ED Results / Procedures / Treatments   Labs (all labs ordered are listed, but only abnormal results are displayed) Labs Reviewed - No data to display  EKG None  Radiology DG Knee Complete 4 Views Right  Result Date: 08/15/2022 CLINICAL DATA:  Fall.  Pain. EXAM: RIGHT KNEE - COMPLETE 4+ VIEW COMPARISON:  None Available. FINDINGS: No evidence for an acute fracture. No subluxation dislocation. Trace spurring is seen in the medial and patellofemoral compartments. No worrisome lytic or sclerotic osseous abnormality. No joint effusion. IMPRESSION: No acute bony abnormality. Electronically Signed   By: Kennith Center M.D.   On: 08/15/2022 14:02   DG Ankle Complete Left  Result Date: 08/15/2022 CLINICAL DATA:  Fall.  Pain. EXAM: LEFT ANKLE COMPLETE - 3+ VIEW COMPARISON:  None Available. FINDINGS: There is no evidence of fracture, dislocation, or joint effusion. There is no evidence of arthropathy or other focal bone abnormality. Soft tissues are unremarkable. IMPRESSION: Negative. Electronically Signed   By: Kennith Center M.D.   On: 08/15/2022 14:01   DG Ankle Complete Right  Result Date: 08/15/2022 CLINICAL DATA:  Missed last step and fell landing on right knee. Pain. EXAM: RIGHT ANKLE - COMPLETE 3+ VIEW COMPARISON:  None Available. FINDINGS: Oblique fracture of the distal fibula identified at the level of the tibiotalar joint. No associated fracture of the distal tibia evident. No subluxation or dislocation at the ankle. Lateral soft tissue swelling  evident. IMPRESSION: Oblique fracture of the distal fibula at the level of the tibiotalar joint. Electronically Signed   By: Kennith Center M.D.   On: 08/15/2022 14:00    Procedures Procedures    Medications Ordered in ED Medications - No data to display  ED Course/ Medical Decision Making/ A&P                             Medical Decision Making Amount and/or Complexity of  Data Reviewed Radiology: ordered.   Katie Allen is here with left and right ankle pain and right knee pain after mechanical fall.  Did not hit her head or lose consciousness.  She is on Plavix.  She is very well-appearing.  She is got bruising and swelling to the right lateral ankle with tenderness.  She is got tenderness to the left ankle.  She got abrasion over the right knee.  X-rays of the right ankle per my review interpretation show oblique fracture of the distal fibula at the level of the tibial talar joint.  Otherwise x-rays of the left ankle and right knee per my review and interpretation are unremarkable.  I talked with Dr. Roda Shutters with orthopedics.  Her left ankle likely has a mild sprain and wanted to get weightbearing instructions.  He is okay with a walking boot for the right ankle in Aircast splint for the last we will get her a walker.  Will prescribe her Roxicodone for breakthrough pain.  Recommend ice, Tylenol, ibuprofen.  Patient neurovascular neuromuscular intact.  Discharged in good condition.  Understands return precautions.  Given follow-up to orthopedics.        Final Clinical Impression(s) / ED Diagnoses Final diagnoses:  Closed fracture of right ankle, initial encounter  Sprain of left ankle, unspecified ligament, initial encounter    Rx / DC Orders ED Discharge Orders          Ordered    oxyCODONE (ROXICODONE) 5 MG immediate release tablet  Every 6 hours PRN        08/15/22 1542              Kentrell Hallahan, DO 08/15/22 1543

## 2022-08-15 NOTE — ED Triage Notes (Signed)
Pt states she missed the last step on her porch and bent both of her feet back, injuring bilateral ankles. Pt states she hit her right knee as well and ha [place] on the outside of her right lower leg.   Pt took tylenol PTA.

## 2022-08-24 ENCOUNTER — Other Ambulatory Visit (HOSPITAL_COMMUNITY): Payer: Self-pay

## 2022-08-24 ENCOUNTER — Ambulatory Visit (HOSPITAL_BASED_OUTPATIENT_CLINIC_OR_DEPARTMENT_OTHER)
Admission: RE | Admit: 2022-08-24 | Discharge: 2022-08-24 | Disposition: A | Discharge status: Home / Self Care | Payer: Medicare HMO | Source: Ambulatory Visit | Attending: Vascular Surgery | Admitting: Vascular Surgery

## 2022-08-24 ENCOUNTER — Encounter: Payer: Self-pay | Admitting: Physician Assistant

## 2022-08-24 ENCOUNTER — Encounter (HOSPITAL_COMMUNITY): Payer: Self-pay

## 2022-08-24 ENCOUNTER — Telehealth: Payer: Self-pay | Admitting: *Deleted

## 2022-08-24 ENCOUNTER — Ambulatory Visit: Payer: Medicare HMO | Admitting: Physician Assistant

## 2022-08-24 ENCOUNTER — Ambulatory Visit (HOSPITAL_COMMUNITY)
Admission: RE | Admit: 2022-08-24 | Discharge: 2022-08-24 | Disposition: A | Discharge status: Home / Self Care | Payer: Medicare HMO | Source: Ambulatory Visit | Attending: Physician Assistant | Admitting: Physician Assistant

## 2022-08-24 VITALS — BP 101/66 | HR 83

## 2022-08-24 DIAGNOSIS — I82451 Acute embolism and thrombosis of right peroneal vein: Secondary | ICD-10-CM | POA: Insufficient documentation

## 2022-08-24 DIAGNOSIS — S82891A Other fracture of right lower leg, initial encounter for closed fracture: Secondary | ICD-10-CM | POA: Diagnosis not present

## 2022-08-24 DIAGNOSIS — S93402A Sprain of unspecified ligament of left ankle, initial encounter: Secondary | ICD-10-CM | POA: Diagnosis not present

## 2022-08-24 DIAGNOSIS — M79661 Pain in right lower leg: Secondary | ICD-10-CM | POA: Diagnosis not present

## 2022-08-24 MED ORDER — APIXABAN 5 MG PO TABS: 5.0000 mg | ORAL_TABLET | Freq: Two times a day (BID) | ORAL | 1 refills | Status: DC

## 2022-08-24 MED ORDER — APIXABAN (ELIQUIS) VTE STARTER PACK (10MG AND 5MG)
ORAL_TABLET | ORAL | 0 refills | Status: DC
  Filled 2022-08-24: qty 74, 30d supply, fill #0

## 2022-08-24 MED ORDER — ACETAMINOPHEN-CODEINE 300-30 MG PO TABS: 1.0000 | ORAL_TABLET | Freq: Three times a day (TID) | ORAL | 0 refills | Status: DC | PRN

## 2022-08-24 NOTE — Progress Notes (Signed)
Office Visit Note   Patient: Katie Allen           Date of Birth: 04-26-1950           MRN: [id number] Visit Date: 08/24/2022              Requested by: Margaree Mackintosh, MD [address] [place],  [place] [id number] PCP: Margaree Mackintosh, MD   Assessment & Plan: Visit Diagnoses:  1. Closed right ankle fracture, initial encounter   2. Sprain of left ankle, unspecified ligament, initial encounter   3. Pain in right lower leg     Plan: Impression is right ankle distal fibula fracture and left ankle sprain.  In regards to the right ankle, we will treat this nonoperatively.  She will continue nonweightbearing in a cam boot.  Ice and elevate for pain and swelling.  I will start her on a baby aspirin to take once daily with her Plavix while she is nonweightbearing.  We will also order an ultrasound of the right lower extremity to rule out DVT.  Follow-up in 3 weeks for repeat evaluation and x-rays of the right ankle.  In regards to the left ankle, she has significantly improved.  She may weight-bear as tolerated.  Call with concerns or questions in the meantime.  Follow-Up Instructions: Return in about 3 weeks (around 09/14/2022).   Orders:  Orders Placed This Encounter  Procedures   VAS [place] LOWER EXTREMITY VENOUS (DVT)   No orders of the defined types were placed in this encounter.     Procedures: No procedures performed   Clinical Data: No additional findings.   Subjective: Chief Complaint  Patient presents with   Right Ankle - Injury    DOI 7/72024   Left Ankle - Injury    DOI 08/15/2022    HPI patient is a pleasant 72 year old female who comes in today with bilateral ankle pain.  On 08/15/2022, she sustained a mechanical fall going down the stairs injuring both ankles.  She was seen where x-rays were obtained.  X-rays demonstrate an oblique fracture to the right ankle in addition to left ankle sprain.  She has been nonweightbearing to the right lower extremity and has  been using an ASO brace to the left ankle until yesterday.  Pain has improved to both ankles.  She has been taking Tylenol and Advil but tells me she should not be taking Advil as she is on Plavix for previous TIA.  Review of Systems as detailed in HPI.  All others reviewed and are negative.   Objective: Vital Signs: There were no vitals taken for this visit.  Physical Exam well-developed well-nourished female no acute distress.  Alert and oriented x 3.  Ortho Exam left ankle exam: No swelling and no tenderness.  Painful range of motion.  She is neurovascular tact distally.  Right ankle exam: Moderate swelling throughout the right ankle and lower leg.  She has moderate tenderness to the distal fibula.  Pain with range of motion.  Calf is somewhat tight and nontender.  Negative Homans.  She is neurovascularly intact distally.  Specialty Comments:  No specialty comments available.  Imaging: X-rays of bilateral ankles reviewed by me.  Right ankle shows oblique fracture of the distal fibula.   PMFS History: Patient Active Problem List   Diagnosis Date Noted   Overweight 02/13/2019   Educated about COVID-19 virus infection 02/13/2019   Family history of sudden death January 11, 2018   Palpitations 01/11/18  Gastroesophageal reflux disease without esophagitis 02/18/2017   Muscle tension dysphonia 02/18/2017   Tongue lesion 02/18/2017   TIA (transient ischemic attack) 09/06/2011   Hypertension 05/06/2010   BPPV (benign paroxysmal positional vertigo) 05/06/2010   Hyperlipemia 05/06/2010   Hashimoto's disease 05/06/2010   Hypothyroid 05/06/2010   Vasomotor flushing 05/06/2010   OBESITY, UNSPECIFIED 09/17/2009   PREMATURE VENTRICULAR CONTRACTIONS 09/17/2009   DYSPNEA 09/17/2009   Past Medical History:  Diagnosis Date   Hashimoto's thyroiditis    Heart palpitations    Hyperlipidemia    x years   Hypertension    x years   Shingles    Squamous carcinoma    L arm    TIA (transient  ischemic attack)     Family History  Problem Relation Age of Onset   Colon cancer Mother    Heart attack Father 83       CABG in his 51s   Cardiomyopathy Brother 80   Stroke Other     Past Surgical History:  Procedure Laterality Date   CHOLECYSTECTOMY     Social History   Occupational History    Employer: [employer]    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

## 2022-08-24 NOTE — Addendum Note (Signed)
Encounter addended by: Dicie Beam, RPH-CPP on: 08/24/2022 3:45 PM  Actions taken: Clinical Note Signed

## 2022-08-24 NOTE — Telephone Encounter (Signed)
Appt scheduled today at 11am at [facility]. Pt is aware of appt. No PA is required per [place] site.

## 2022-08-24 NOTE — Telephone Encounter (Signed)
-----   Message from Wendi Maya sent at 08/24/2022  9:24 AM EDT ----- Ultrasound to rule out DVT

## 2022-08-24 NOTE — Progress Notes (Signed)
Phone is busy x 3. I have sent a mychart message since I am unable to get through on phone.

## 2022-08-24 NOTE — Progress Notes (Signed)
Right lower extremity venous duplex has been completed. Preliminary results can be found in CV Proc through chart review.  Results were given to [facility] PA.   08/24/22 10:20 AM Olen Cordial RVT

## 2022-08-24 NOTE — Progress Notes (Addendum)
DVT Clinic Note  Name: Katie Allen     MRN: [id number]     DOB: 27-Jul-1950     Sex: female  PCP: Margaree Mackintosh, MD  Today's Visit: Visit Information: Initial Visit  Referred to DVT Clinic by: Jari Sportsman, PA-C  Referred to CPP by: Dr. Edilia Bo Reason for referral:  Chief Complaint  Patient presents with   DVT   HISTORY OF PRESENT ILLNESS: Katie Allen is a 72 y.o. female with PMH HTN, HLD, TIA in 2013 while on estrogen replacement now maintained on Plavix, palpitations, hypothyroidism, obesity. Patient presented to the ED 08/15/22 after a mechanical fall down her porch steps with injury to the right ankle. X-rays demonstrated an oblique fracture to the right ankle in addition to left ankle sprain. She was placed in a walking boot for the right ankle and has been nonweightbearing. She was seen at [facility] today 08/24/22 for follow up and was continuing to have RLE pain and swelling so an [place] was ordered to rule out DVT. They started her on a baby aspirin to take with Plavix while she is nonweightbearing for DVT prophylaxis. [place] completed today showed acute DVT involving the right peroneal vein. She is up to date on cancer screenings.She reports there was significant bruising of the right ankle and the pain and swelling extending from the ankle through her right calf was not improving. She has been sitting with her right foot elevated for the last week. She hasn't been moving much outside of getting up to go to the kitchen or bathroom. She presents in a wheelchair today accompanied by her husband.   Positive Thrombotic Risk Factors: Recent trauma (within 3 months), Paralysis, paresis, or recent plaster cast immobilization of lower extremity, Older Age, Bed rest >72 hours within 3 month Bleeding Risk Factors: Age >65 years, Antiplatelet therapy  Negative Thrombotic Risk Factors: Previous VTE, Recent surgery (within 3 months), Recent admission to hospital with acute illness (within 3  months), Estrogen therapy, Testosterone therapy, Pregnancy, Central venous catheterization, Obesity, Smoking, Active cancer, Known thrombophilic condition, Recent COVID diagnosis (within 3 months), Within 6 weeks postpartum, Recent cesarean section (within 3 months), Erythropoiesis-stimulating agent, Non-malignant, chronic inflammatory condition, Sedentary journey lasting >8 hours within 4 weeks  Rx Insurance Coverage: Medicare Rx Affordability: Eliquis starter pack filled at [facility] today for $0 using one time free card. Refills will be $47/month which is affordable per the patient.  Preferred Pharmacy: Filled starter pack at [facility]. [facility]   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 Has a 54 year old son.   Patient lives at home with spouse.   Caffeine Use: 4 cups daily   Social Determinants of Health   Financial Resource Strain: Low Risk  (05/28/2022)   Overall Financial Resource Strain (CARDIA)    Difficulty of Paying Living Expenses: Not hard at all  Food Insecurity: No Food Insecurity (05/28/2022)   Hunger Vital Sign     Worried About Running Out of Food in the Last Year: Never true    Ran Out of Food in the Last Year: Never true  Transportation Needs: No Transportation Needs (05/28/2022)   PRAPARE - Administrator, Civil Service (Medical): No    Lack of Transportation (Non-Medical): No  Physical Activity: Unknown (05/28/2022)   Exercise Vital Sign    Days of Exercise per Week: Patient declined    Minutes of Exercise per Session: Not on file  Stress: No Stress Concern Present (05/28/2022)   [employer] of Occupational Health - Occupational Stress Questionnaire    Feeling of Stress : Not at all  Social Connections: Unknown (05/28/2022)   Social Connection and Isolation Panel [NHANES]    Frequency of Communication with Friends and Family: Twice a week    Frequency of Social Gatherings with Friends and Family: Once a week    Attends Religious Services: Patient declined    Database administrator or Organizations: No    Attends Engineer, structural: Not on file    Marital Status: Married  Catering manager Violence: Not on file    Family History  Problem Relation Age of Onset   Colon cancer Mother    Heart attack Father 33       CABG in his 22s   Cardiomyopathy Brother 69   Stroke Other     Allergies as of 08/24/2022 - Review Complete 08/24/2022  Allergen Reaction Noted   Penicillins Swelling    Codeine Nausea Only 08/24/2022    Current Outpatient Medications on File Prior to Encounter  Medication Sig Dispense Refill   Ascorbic Acid (VITAMIN C) 500 MG tablet Take 500 mg by mouth daily.     atorvastatin (LIPITOR) 80 MG tablet TAKE 1 TABLET BY MOUTH EVERY DAY 90 tablet 3   b complex vitamins capsule Take 1 capsule by mouth daily.     Cholecalciferol 50 MCG (2000 UT) CAPS Take by mouth.     clopidogrel (PLAVIX) 75 MG tablet TAKE 1 TABLET BY MOUTH WITH BREAKFAST 90 tablet 3   DENTAGEL 1.1 % GEL dental gel      levothyroxine (SYNTHROID) 50 MCG tablet TAKE 1/2 TABLET BY MOUTH  EVERY OTHER DAY, AND 1 TABLET ON ALTERNATING DAYS 70 tablet 1   lisinopril-hydrochlorothiazide (ZESTORETIC) 20-25 MG tablet TAKE 1 TABLET BY MOUTH EVERY DAY 90 tablet 1   metoprolol tartrate (LOPRESSOR) 25 MG tablet TAKE 1.5 TABLETS (37.5 MG TOTAL) BY MOUTH 2 (TWO) TIMES DAILY. 270 tablet 3   Multiple Vitamin (MULTIVITAMIN) tablet Take 1 tablet by mouth daily.     OMEPRAZOLE PO Take by mouth. Taking as needed, trying to get off     potassium chloride (KLOR-CON) 10 MEQ tablet TAKE 1 TABLET BY MOUTH EVERY DAY 90 tablet 3   acetaminophen-codeine (TYLENOL #3) 300-30 MG tablet Take 1 tablet by mouth 3 (three) times daily as needed for moderate pain. (Patient not taking: Reported on 08/24/2022)  30 tablet 0   No current facility-administered medications on file prior to encounter.   REVIEW OF SYSTEMS:  Review of Systems  Respiratory:  Negative for shortness of breath.   Cardiovascular:  Positive for leg swelling. Negative for chest pain and palpitations.  Musculoskeletal:  Positive for myalgias.  Neurological:  Negative for dizziness and tingling.   PHYSICAL EXAMINATION:  Vitals:   08/24/22 1407  BP: 101/66  Pulse: 83  SpO2: 94%   Physical Exam Vitals reviewed.  Cardiovascular:     Rate and Rhythm: Normal rate.  Pulmonary:     Effort: Pulmonary effort is normal.  Musculoskeletal:        General: Tenderness present.     Right lower leg: Edema present.     Left lower leg: No edema.  Skin:    Findings: Bruising present. No erythema.  Psychiatric:        Mood and Affect: Mood normal.        Behavior: Behavior normal.        Thought Content: Thought content normal.   Villalta Score for Post-Thrombotic Syndrome: Pain: Mild Cramps: Absent Heaviness: Mild Paresthesia: Absent Pruritus: Absent Pretibial Edema: Moderate Skin Induration: Absent Hyperpigmentation: Absent Redness: Absent Venous Ectasia: Mild Pain on calf compression: Absent Villalta Preliminary Score: 5 Is venous  ulcer present?: No If venous ulcer is present and score is <15, then 15 points total are assigned: Absent Villalta Total Score: 5  LABS:  CBC     Component Value Date/Time   WBC 4.4 05/28/2022 1245   RBC 4.95 05/28/2022 1245   HGB 14.2 05/28/2022 1245   HCT 42.7 05/28/2022 1245   PLT 178 05/28/2022 1245   MCV 86.3 05/28/2022 1245   MCV 86.0 12/04/2012 1552   MCH 28.7 05/28/2022 1245   MCHC 33.3 05/28/2022 1245   RDW 13.1 05/28/2022 1245   LYMPHSABS 1,914 05/28/2022 1245   MONOABS 310 10/07/2015 0905   EOSABS 40 05/28/2022 1245   BASOSABS 9 05/28/2022 1245    Hepatic Function      Component Value Date/Time   PROT 6.6 04/22/2022 0912   PROT 6.8 03/09/2013 1017   ALBUMIN 4.1 06/07/2016 0950   ALBUMIN 4.6 03/09/2013 1017   AST 15 04/22/2022 0912   ALT 20 04/22/2022 0912   ALKPHOS 73 06/07/2016 0950   BILITOT 0.9 04/22/2022 0912   BILIDIR 0.1 10/08/2021 1155   IBILI 0.5 10/08/2021 1155    Renal Function   Lab Results  Component Value Date   CREATININE 0.59 (L) 04/22/2022   CREATININE 0.56 04/17/2021   CREATININE 0.67 04/11/2020    CrCl cannot be calculated (Patient's most recent lab result is older than the maximum 21 days allowed.).   VVS Vascular Lab Studies:  08/24/22 VAS [place] LOWER EXTREMITY VENOUS (DVT)RIGHT Summary:  RIGHT:  - Findings consistent with acute deep vein thrombosis involving the right  peroneal veins.  - No cystic structure found in the popliteal fossa.    LEFT:  - No evidence of common femoral vein obstruction.   ASSESSMENT: Location of DVT: Right distal vein Cause of DVT: provoked by a transient risk factor - trauma from recent fall and R ankle fracture, immobilization of RLE with foot in CAM boot, and decreased mobility for the last week following this injury. Labs are up to date and without concern for starting anticoagulation with Eliquis. Will plan to treat for 3 months as long as her ankle continues to improve and she has returned to her  baseline  level of activity. Eliquis was filled today at our pharmacy and refills were provided to her preferred pharmacy. No medication access issues anticipated. No need for aspirin for DVT prophylaxis - the patient is aware to not start this. She is on Plavix for a history of TIA in 2013. We will defer the decision on whether Plavix needs to be held during treatment with Eliquis to her PCP who is prescribing this. We discussed the increased risk of bleeding and what to monitor for.   PLAN: -Start apixaban (Eliquis) 10 mg twice daily for 7 days followed by 5 mg twice daily. -Expected duration of therapy: 3 months. Therapy started on 08/24/22. -Patient educated on purpose, proper use and potential adverse effects of apixaban (Eliquis). -Discussed importance of taking medication around the same time every day. -Advised patient of medications to avoid (NSAIDs, aspirin doses >100 mg daily). -Educated that Tylenol (acetaminophen) is the preferred analgesic to lower the risk of bleeding. -Advised patient to alert all providers of anticoagulation therapy prior to starting a new medication or having a procedure. -Emphasized importance of monitoring for signs and symptoms of bleeding (abnormal bruising, prolonged bleeding, nose bleeds, bleeding from gums, discolored urine, black tarry stools). -Educated patient to present to the ED if emergent signs and symptoms of new thrombosis occur. -Counseled patient to wear compression stockings daily, removing at night. Continue elevation to help with swelling.   Follow up: 3 months in DVT Clinic  Pervis Hocking, PharmD, [place], CPP Deep Vein Thrombosis Clinic Clinical Pharmacist Practitioner Office: [phone]  I have evaluated the patient's chart/imaging and refer this patient to the Clinical Pharmacist Practitioner for medication management. I have reviewed the CPP's documentation and agree with her assessment and plan. I was immediately available during the  visit for questions and collaboration.   Waverly Ferrari, MD

## 2022-08-24 NOTE — Patient Instructions (Signed)
-  Start apixaban (Eliquis) 10 mg twice daily for 7 days followed by 5 mg twice daily. -Your [facility]. You may need to call the pharmacy to ask them to fill this when you start to run low on your current supply.  -It is important to take your medication around the same time every day.  -Avoid NSAIDs like ibuprofen (Advil, Motrin) and naproxen (Aleve) as well as aspirin doses over 100 mg daily. -Tylenol (acetaminophen) is the preferred over the counter pain medication to lower the risk of bleeding. -Be sure to alert all of your health care providers that you are taking an anticoagulant prior to starting a new medication or having a procedure. -Monitor for signs and symptoms of bleeding (abnormal bruising, prolonged bleeding, nose bleeds, bleeding from gums, discolored urine, black tarry stools). If you have fallen and hit your head OR if your bleeding is severe or not stopping, seek emergency care.  -Go to the emergency room if emergent signs and symptoms of new clot occur (new or worse swelling and pain in an arm or leg, shortness of breath, chest pain, fast or irregular heartbeats, lightheadedness, dizziness, fainting, coughing up blood) or if you experience a significant color change (pale or blue) in the extremity that has the DVT.  -We recommend you wear compression stockings (20-30 mmHg) as long as you are having swelling or pain. Be sure to purchase the correct size and take them off at night. Elevate your legs as well.   Your next visit is on November 18, 2022 at [facility] [address] [place], [place], [place] [address] Enter the hospital through Entrance C off [facility] and pull up to the [facility] entrance to the free valet parking.  Check in for your appointment at the [facility] V[facility] Center.   If you have any questions or need to reschedule an appointment, please call [phone] [facility].  If you are having an  emergency, call 911 or present to the nearest emergency room.   What is a DVT?  -Deep vein thrombosis (DVT) is a condition in which a blood clot forms in a vein of the deep venous system which can occur in the lower leg, thigh, pelvis, arm, or neck. This condition is serious and can be life-threatening if the clot travels to the arteries of the lungs and causing a blockage (pulmonary embolism, PE). A DVT can also damage veins in the leg, which can lead to long-term venous disease, leg pain, swelling, discoloration, and ulcers or sores (post-thrombotic syndrome).  -Treatment may include taking an anticoagulant medication to prevent more clots from forming and the current clot from growing, wearing compression stockings, and/or surgical procedures to remove or dissolve the clot.

## 2022-08-26 NOTE — Progress Notes (Signed)
LMOM for patient to call but also sent mychart message. Patient has read the [employer].

## 2022-08-31 ENCOUNTER — Other Ambulatory Visit: Payer: Self-pay | Admitting: Physician Assistant

## 2022-08-31 ENCOUNTER — Telehealth: Payer: Self-pay | Admitting: Physician Assistant

## 2022-08-31 NOTE — Telephone Encounter (Signed)
Left vm to call me back

## 2022-08-31 NOTE — Telephone Encounter (Signed)
Left another vm.  I would ice and elevate and we can increase pain meds if needed.  She may also have increased pain from the dvt

## 2022-08-31 NOTE — Telephone Encounter (Signed)
Patient called. She is still hurting and sore. Would like Mardella Layman to call her. [phone]

## 2022-08-31 NOTE — Telephone Encounter (Signed)
Patient called. Returning a call to [employer]

## 2022-09-14 ENCOUNTER — Ambulatory Visit: Payer: Medicare HMO | Admitting: Physician Assistant

## 2022-09-14 ENCOUNTER — Other Ambulatory Visit (INDEPENDENT_AMBULATORY_CARE_PROVIDER_SITE_OTHER): Payer: Medicare HMO

## 2022-09-14 DIAGNOSIS — S82891A Other fracture of right lower leg, initial encounter for closed fracture: Secondary | ICD-10-CM

## 2022-09-14 NOTE — Progress Notes (Signed)
Office Visit Note   Patient: Katie Allen           Date of Birth: 1950-07-12           MRN: [id number] Visit Date: 09/14/2022              Requested by: Margaree Mackintosh, MD [address] [place],  [place] [address] PCP: Margaree Mackintosh, MD   Assessment & Plan: Visit Diagnoses:  1. Closed right ankle fracture, initial encounter     Plan: Impression is 4 weeks status post right distal fibula fracture.  Patient is clinically and radiographically improving.  She is overall feeling much better.  We will allow her to start bearing weight in the cam boot for the next 2 weeks.  Will then let her transition to an ASO weightbearing as tolerated.  I have put in a referral for outpatient physical therapy for gait training and range of motion.  Follow-up in 4 weeks for repeat evaluation and three-view x-rays of the right ankle.  Call with concerns or questions.  Follow-Up Instructions: Return in about 4 weeks (around 10/12/2022).   Orders:  Orders Placed This Encounter  Procedures   XR Ankle Complete Right   Ambulatory referral to Physical Therapy   No orders of the defined types were placed in this encounter.     Procedures: No procedures performed   Clinical Data: No additional findings.   Subjective: Chief Complaint  Patient presents with   Right Ankle - Follow-up    HPI patient is a pleasant 72 year old female who comes in today approximately 4 weeks status post right ankle fracture, date of surgery 08/15/2022.  She developed peroneal DVT soon after her injury and is now on Eliquis.  Overall, she is feeling much better.  She has been compliant nonweightbearing in a cam boot.  She is using compression socks but notes this is causing swelling to her foot.  She is working during the day and due to the hide of her desk is unable to really elevate her right lower extremity.  Review of Systems as detailed in HPI.  All others reviewed and are negative.   Objective: Vital  Signs: There were no vitals taken for this visit.  Physical Exam well-developed well-nourished female no acute distress.  Alert and oriented x 3.  Ortho Exam right ankle exam: Moderate swelling throughout the ankle and calf.  Calf is soft nontender.  Ankle does not exhibit tenderness to the fracture site.  No pain with range of motion although this is slightly limited.  She has slight decrease sensation to the dorsum of the foot.  Motor sensation intact.  Specialty Comments:  No specialty comments available.  Imaging: XR Ankle Complete Right  Result Date: 09/14/2022 X-rays demonstrate considerable consolidation of the fracture site    PMFS History: Patient Active Problem List   Diagnosis Date Noted   Acute deep vein thrombosis (DVT) of right peroneal vein (HCC) 08/24/2022   Overweight 02/13/2019   Educated about COVID-19 virus infection 02/13/2019   Family history of sudden death Jan 03, 2018   Palpitations 01/03/18   Gastroesophageal reflux disease without esophagitis 02/18/2017   Muscle tension dysphonia 02/18/2017   Tongue lesion 02/18/2017   TIA (transient ischemic attack) 09/06/2011   Hypertension 05/06/2010   BPPV (benign paroxysmal positional vertigo) 05/06/2010   Hyperlipemia 05/06/2010   Hashimoto's disease 05/06/2010   Hypothyroid 05/06/2010   Vasomotor flushing 05/06/2010   OBESITY, UNSPECIFIED 09/17/2009   PREMATURE VENTRICULAR CONTRACTIONS 09/17/2009  DYSPNEA 09/17/2009   Past Medical History:  Diagnosis Date   Hashimoto's thyroiditis    Heart palpitations    Hyperlipidemia    x years   Hypertension    x years   Shingles    Squamous carcinoma    L arm    TIA (transient ischemic attack)     Family History  Problem Relation Age of Onset   Colon cancer Mother    Heart attack Father 13       CABG in his 28s   Cardiomyopathy Brother 56   Stroke Other     Past Surgical History:  Procedure Laterality Date   CHOLECYSTECTOMY     Social History    Occupational History    Employer: [employer]    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

## 2022-09-23 ENCOUNTER — Ambulatory Visit: Payer: Medicare HMO | Attending: Physician Assistant

## 2022-09-23 ENCOUNTER — Other Ambulatory Visit: Payer: Self-pay

## 2022-09-23 DIAGNOSIS — R600 Localized edema: Secondary | ICD-10-CM | POA: Insufficient documentation

## 2022-09-23 DIAGNOSIS — M25671 Stiffness of right ankle, not elsewhere classified: Secondary | ICD-10-CM | POA: Diagnosis not present

## 2022-09-23 DIAGNOSIS — S82891A Other fracture of right lower leg, initial encounter for closed fracture: Secondary | ICD-10-CM | POA: Diagnosis not present

## 2022-09-23 DIAGNOSIS — M25571 Pain in right ankle and joints of right foot: Secondary | ICD-10-CM | POA: Insufficient documentation

## 2022-09-23 NOTE — Therapy (Signed)
OUTPATIENT PHYSICAL THERAPY LOWER EXTREMITY EVALUATION   Patient Name: Katie Allen MRN: [id number] DOB:01-13-1951, 72 y.o., female Today's Date: 09/23/2022  END OF SESSION:  PT End of Session - 09/23/22 0847     Visit Number 1    Number of Visits 8    Date for PT Re-Evaluation 12/03/22    PT Start Time 0847    PT Stop Time 0920    PT Time Calculation (min) 33 min    Activity Tolerance Patient tolerated treatment well    Behavior During Therapy [facility] for tasks assessed/performed             Past Medical History:  Diagnosis Date   Hashimoto's thyroiditis    Heart palpitations    Hyperlipidemia    x years   Hypertension    x years   Shingles    Squamous carcinoma    L arm    TIA (transient ischemic attack)    Past Surgical History:  Procedure Laterality Date   CHOLECYSTECTOMY     Patient Active Problem List   Diagnosis Date Noted   Acute deep vein thrombosis (DVT) of right peroneal vein (HCC) 08/24/2022   Overweight 02/13/2019   Educated about COVID-19 virus infection 02/13/2019   Family history of sudden death 10-Jan-2018   Palpitations 2018-01-10   Gastroesophageal reflux disease without esophagitis 02/18/2017   Muscle tension dysphonia 02/18/2017   Tongue lesion 02/18/2017   TIA (transient ischemic attack) 09/06/2011   Hypertension 05/06/2010   BPPV (benign paroxysmal positional vertigo) 05/06/2010   Hyperlipemia 05/06/2010   Hashimoto's disease 05/06/2010   Hypothyroid 05/06/2010   Vasomotor flushing 05/06/2010   OBESITY, UNSPECIFIED 09/17/2009   PREMATURE VENTRICULAR CONTRACTIONS 09/17/2009   DYSPNEA 09/17/2009    PCP: Margaree Mackintosh, MD  REFERRING PROVIDER: Cristie Hem, PA-C   REFERRING DIAG: Closed right ankle fracture, initial encounter   THERAPY DIAG:  Pain in right ankle and joints of right foot  Stiffness of right ankle, not elsewhere classified  Localized edema  Rationale for Evaluation and Treatment:  Rehabilitation  ONSET DATE: 08/15/22  SUBJECTIVE:   SUBJECTIVE STATEMENT: Patient reports that she fell while walking down her steps at home on 08/15/22. She notes that she fractured her right ankle and sprained her left ankle. She also developed a DVT in her right leg which is still being treated. She has one more week in a boot before transitioning to a brace.   PERTINENT HISTORY: Hypertension, history of TIA, and DVT PAIN:  Are you having pain? Yes: NPRS scale: 1-2/10 Pain location: right ankle Pain description: intermittent stabbing pain  Aggravating factors: a lot of pressure on her foot Relieving factors: elevating her foot  PRECAUTIONS: Other: DVT  RED FLAGS: None   WEIGHT BEARING RESTRICTIONS: No  FALLS:  Has patient fallen in last 6 months? Yes. Number of falls 1  LIVING ENVIRONMENT: Lives with: lives with their spouse Lives in: House/apartment Stairs: Yes: Internal: 1 flight steps; can reach both and External: 3 steps; can reach both Has following equipment at home: None  OCCUPATION: office manager  PLOF: Independent  PATIENT GOALS: be able to garden, walk better, and back to her daily activities  NEXT MD VISIT: 10/19/22  OBJECTIVE:   DIAGNOSTIC FINDINGS: 09/14/22 right ankle x-ray  X-rays demonstrate considerable consolidation of the fracture site  PATIENT SURVEYS:  FOTO to be completed at first follow up   COGNITION: Overall cognitive status: Within functional limits for tasks assessed     SENSATION:  Patient reports diminished sensation in the 4th and 5th toes of her right foot.   EDEMA:  Figure 8: R: 57.5 cm L: 55 cm   PALPATION: TTP: right anterior tibialis and distal tibia   JOINT MOBILITY:  Right subtalar: WFL and nonpainful   Right midfoot: WFL and nonpainful   Right distal fibular: WFL and nonpainful  Right talocrural: WFL and painful   LOWER EXTREMITY ROM:  Active ROM Right eval Left eval  Hip flexion    Hip extension    Hip  abduction    Hip adduction    Hip internal rotation    Hip external rotation    Knee flexion    Knee extension    Ankle dorsiflexion 3; tight  3  Ankle plantarflexion 28; slight pain and tight  58  Ankle inversion 30; painful  27  Ankle eversion 13; increased pain  13   (Blank rows = not tested)  LOWER EXTREMITY MMT: not tested at this time   MMT Right eval Left eval  Hip flexion    Hip extension    Hip abduction    Hip adduction    Hip internal rotation    Hip external rotation    Knee flexion    Knee extension    Ankle dorsiflexion    Ankle plantarflexion    Ankle inversion    Ankle eversion     (Blank rows = not tested)  GAIT: Assistive device utilized:  Right CAM boot Level of assistance: Modified independence Comments: decreased gait speed and stride length secondary to CAM boot    TODAY'S TREATMENT:                                                                                                                              DATE:     PATIENT EDUCATION:  Education details: POC, healing, prognosis, objective findings, and goals for therapy  Person educated: Patient Education method: Explanation Education comprehension: verbalized understanding  HOME EXERCISE PROGRAM:   ASSESSMENT:  CLINICAL IMPRESSION: Patient is a 72 y.o. female who was seen today for physical therapy evaluation and treatment following a right ankle fracture on 08/15/22. She presented with low pain severity and irritability with active right ankle inversion and eversion being the most aggravating to her familiar symptoms. She experienced a significant deficit in right ankle plantarflexion compared to the left ankle. Recommend that she continue with skilled physical therapy to address her impairments to return to her prior level of function.   OBJECTIVE IMPAIRMENTS: Abnormal gait, decreased activity tolerance, decreased balance, decreased mobility, difficulty walking, decreased ROM, decreased  strength, increased edema, impaired sensation, impaired tone, and pain.   ACTIVITY LIMITATIONS: carrying, lifting, standing, squatting, stairs, and locomotion level  PARTICIPATION LIMITATIONS: meal prep, cleaning, laundry, shopping, and community activity  PERSONAL FACTORS: Time since onset of injury/illness/exacerbation and 3+ comorbidities: Hypertension, history of TIA, and DVT  are also affecting patient's functional outcome.   REHAB POTENTIAL:  Good  CLINICAL DECISION MAKING: Evolving/moderate complexity  EVALUATION COMPLEXITY: Moderate   GOALS: Goals reviewed with patient? Yes  LONG TERM GOALS: Target date: 10/21/22  Patient will be independent with her HEP.  Baseline:  Goal status: INITIAL  2.  Patient will be able to ambulate with no significant gait deviations.  Baseline:  Goal status: INITIAL  3.  Patient will be able to demonstrate proper lifting mechanics for improved function gardening.  Baseline:  Goal status: INITIAL  4.  Patient will improve he right ankle plantarflexion to at least 38 degrees for improved ankle mobility.  Baseline:  Goal status: INITIAL  PLAN:  PT FREQUENCY: 2x/week  PT DURATION: 4 weeks  PLANNED INTERVENTIONS: Therapeutic exercises, Therapeutic activity, Neuromuscular re-education, Balance training, Gait training, Patient/Family education, Self Care, Joint mobilization, Stair training, Electrical stimulation, Cryotherapy, Moist heat, Manual therapy, and Re-evaluation  PLAN FOR NEXT SESSION: Nustep, ankle AROM, progressing out of CAM boot into brace   Granville Lewis, PT 09/23/2022, 3:54 PM

## 2022-09-28 ENCOUNTER — Ambulatory Visit: Payer: Medicare HMO

## 2022-09-28 DIAGNOSIS — R600 Localized edema: Secondary | ICD-10-CM

## 2022-09-28 DIAGNOSIS — M25671 Stiffness of right ankle, not elsewhere classified: Secondary | ICD-10-CM | POA: Diagnosis not present

## 2022-09-28 DIAGNOSIS — M25571 Pain in right ankle and joints of right foot: Secondary | ICD-10-CM

## 2022-09-28 DIAGNOSIS — S82891A Other fracture of right lower leg, initial encounter for closed fracture: Secondary | ICD-10-CM | POA: Diagnosis not present

## 2022-09-28 NOTE — Therapy (Signed)
OUTPATIENT PHYSICAL THERAPY LOWER EXTREMITY TREATMENT   Patient Name: Katie Allen MRN: [id number] DOB:1950-11-10, 72 y.o., female Today's Date: 09/28/2022  END OF SESSION:  PT End of Session - 09/28/22 1553     Visit Number 2    Number of Visits 8    Date for PT Re-Evaluation 12/03/22    PT Start Time 1600    PT Stop Time 1643    PT Time Calculation (min) 43 min    Activity Tolerance Patient tolerated treatment well    Behavior During Therapy [facility] for tasks assessed/performed             Past Medical History:  Diagnosis Date   Hashimoto's thyroiditis    Heart palpitations    Hyperlipidemia    x years   Hypertension    x years   Shingles    Squamous carcinoma    L arm    TIA (transient ischemic attack)    Past Surgical History:  Procedure Laterality Date   CHOLECYSTECTOMY     Patient Active Problem List   Diagnosis Date Noted   Acute deep vein thrombosis (DVT) of right peroneal vein (HCC) 08/24/2022   Overweight 02/13/2019   Educated about COVID-19 virus infection 02/13/2019   Family history of sudden death 2018/01/16   Palpitations 2018/01/16   Gastroesophageal reflux disease without esophagitis 02/18/2017   Muscle tension dysphonia 02/18/2017   Tongue lesion 02/18/2017   TIA (transient ischemic attack) 09/06/2011   Hypertension 05/06/2010   BPPV (benign paroxysmal positional vertigo) 05/06/2010   Hyperlipemia 05/06/2010   Hashimoto's disease 05/06/2010   Hypothyroid 05/06/2010   Vasomotor flushing 05/06/2010   OBESITY, UNSPECIFIED 09/17/2009   PREMATURE VENTRICULAR CONTRACTIONS 09/17/2009   DYSPNEA 09/17/2009    PCP: Margaree Mackintosh, MD  REFERRING PROVIDER: Cristie Hem, PA-C   REFERRING DIAG: Closed right ankle fracture, initial encounter   THERAPY DIAG:  Pain in right ankle and joints of right foot  Stiffness of right ankle, not elsewhere classified  Localized edema  Rationale for Evaluation and Treatment: Rehabilitation  ONSET  DATE: 08/15/22  SUBJECTIVE:   SUBJECTIVE STATEMENT: Patient reports that she feels about the same today.   PERTINENT HISTORY: Hypertension, history of TIA, and DVT PAIN:  Are you having pain? Yes: NPRS scale: 1-2/10 Pain location: right ankle Pain description: intermittent stabbing pain  Aggravating factors: a lot of pressure on her foot Relieving factors: elevating her foot  PRECAUTIONS: Other: DVT  RED FLAGS: None   WEIGHT BEARING RESTRICTIONS: No  FALLS:  Has patient fallen in last 6 months? Yes. Number of falls 1  LIVING ENVIRONMENT: Lives with: lives with their spouse Lives in: House/apartment Stairs: Yes: Internal: 1 flight steps; can reach both and External: 3 steps; can reach both Has following equipment at home: None  OCCUPATION: office manager  PLOF: Independent  PATIENT GOALS: be able to garden, walk better, and back to her daily activities  NEXT MD VISIT: 10/19/22  OBJECTIVE:   DIAGNOSTIC FINDINGS: 09/14/22 right ankle x-ray  X-rays demonstrate considerable consolidation of the fracture site  PATIENT SURVEYS:  FOTO to be completed at first follow up   COGNITION: Overall cognitive status: Within functional limits for tasks assessed     SENSATION: Patient reports diminished sensation in the 4th and 5th toes of her right foot.   EDEMA:  Figure 8: R: 57.5 cm L: 55 cm   PALPATION: TTP: right anterior tibialis and distal tibia   JOINT MOBILITY:  Right subtalar: WFL and  nonpainful   Right midfoot: WFL and nonpainful   Right distal fibular: WFL and nonpainful  Right talocrural: WFL and painful   LOWER EXTREMITY ROM:  Active ROM Right eval Left eval  Hip flexion    Hip extension    Hip abduction    Hip adduction    Hip internal rotation    Hip external rotation    Knee flexion    Knee extension    Ankle dorsiflexion 3; tight  3  Ankle plantarflexion 28; slight pain and tight  58  Ankle inversion 30; painful  27  Ankle eversion 13;  increased pain  13   (Blank rows = not tested)  LOWER EXTREMITY MMT: not tested at this time   MMT Right eval Left eval  Hip flexion    Hip extension    Hip abduction    Hip adduction    Hip internal rotation    Hip external rotation    Knee flexion    Knee extension    Ankle dorsiflexion    Ankle plantarflexion    Ankle inversion    Ankle eversion     (Blank rows = not tested)  GAIT: Assistive device utilized:  Right CAM boot Level of assistance: Modified independence Comments: decreased gait speed and stride length secondary to CAM boot    TODAY'S TREATMENT:                                                                                                                              DATE:                                     09/28/22 EXERCISE LOG  Exercise Repetitions and Resistance Comments  Nustep  L4-5 x 11 minutes   Rocker board (seated) 4 minutes DF/PF  Rocker board (seated, lateral)  1.5 minutes   Toe curls 2 minutes   Standing weight shift 2 minutes   Circles on dynadisc (seated)  3 minutes  CW and CCW  Static stance on foam  3 minutes  Narrow BOS   Blank cell = exercise not performed today  Manual Therapy Soft Tissue Mobilization: right fibularis longus and brevis, for improved soft tissue extensibility    PATIENT EDUCATION:  Education details: POC, healing, prognosis, objective findings, and goals for therapy  Person educated: Patient Education method: Explanation Education comprehension: verbalized understanding  HOME EXERCISE PROGRAM:   ASSESSMENT:  CLINICAL IMPRESSION: Patient was introduced to multiple new interventions for improved ankle mobility and stability. She required minimal cueing with today's new interventions for proper exercise performance. Manual therapy focused on soft tissue mobilization to her right fibularis longus and brevis for reduced pain with ankle inversion and eversion. This was able to moderately reduce her familiar symptoms  with these motions. She reported that her ankle was a little sore upon the conclusion of treatment. She  continues to require skilled physical therapy to address her remaining impairments to return to her prior level of function.   OBJECTIVE IMPAIRMENTS: Abnormal gait, decreased activity tolerance, decreased balance, decreased mobility, difficulty walking, decreased ROM, decreased strength, increased edema, impaired sensation, impaired tone, and pain.   ACTIVITY LIMITATIONS: carrying, lifting, standing, squatting, stairs, and locomotion level  PARTICIPATION LIMITATIONS: meal prep, cleaning, laundry, shopping, and community activity  PERSONAL FACTORS: Time since onset of injury/illness/exacerbation and 3+ comorbidities: Hypertension, history of TIA, and DVT  are also affecting patient's functional outcome.   REHAB POTENTIAL: Good  CLINICAL DECISION MAKING: Evolving/moderate complexity  EVALUATION COMPLEXITY: Moderate   GOALS: Goals reviewed with patient? Yes  LONG TERM GOALS: Target date: 10/21/22  Patient will be independent with her HEP.  Baseline:  Goal status: INITIAL  2.  Patient will be able to ambulate with no significant gait deviations.  Baseline:  Goal status: INITIAL  3.  Patient will be able to demonstrate proper lifting mechanics for improved function gardening.  Baseline:  Goal status: INITIAL  4.  Patient will improve he right ankle plantarflexion to at least 38 degrees for improved ankle mobility.  Baseline:  Goal status: INITIAL  PLAN:  PT FREQUENCY: 2x/week  PT DURATION: 4 weeks  PLANNED INTERVENTIONS: Therapeutic exercises, Therapeutic activity, Neuromuscular re-education, Balance training, Gait training, Patient/Family education, Self Care, Joint mobilization, Stair training, Electrical stimulation, Cryotherapy, Moist heat, Manual therapy, and Re-evaluation  PLAN FOR NEXT SESSION: Nustep, ankle AROM, progressing out of CAM boot into brace   Granville Lewis, PT 09/28/2022, 5:58 PM

## 2022-09-29 ENCOUNTER — Telehealth: Payer: Self-pay | Admitting: Physician Assistant

## 2022-09-29 NOTE — Telephone Encounter (Signed)
Patient called advised the ankle brace she got is to small. Patient asked if she can come pick up a larger size. The number to contact patient is [phone].

## 2022-09-29 NOTE — Telephone Encounter (Signed)
Pt returned call to Lauren G for bigger brace. Made pt an nurse visit appt on 9/22 at 10 Am.

## 2022-09-29 NOTE — Telephone Encounter (Signed)
Tried to call patient. No answer. LMOM that she can come in, ask that she call and make a nurse visit appt.

## 2022-09-30 ENCOUNTER — Ambulatory Visit: Payer: Medicare HMO

## 2022-09-30 ENCOUNTER — Telehealth: Payer: Self-pay | Admitting: Internal Medicine

## 2022-09-30 DIAGNOSIS — R600 Localized edema: Secondary | ICD-10-CM

## 2022-09-30 DIAGNOSIS — S82891A Other fracture of right lower leg, initial encounter for closed fracture: Secondary | ICD-10-CM | POA: Diagnosis not present

## 2022-09-30 DIAGNOSIS — M25571 Pain in right ankle and joints of right foot: Secondary | ICD-10-CM | POA: Diagnosis not present

## 2022-09-30 DIAGNOSIS — Z13820 Encounter for screening for osteoporosis: Secondary | ICD-10-CM

## 2022-09-30 DIAGNOSIS — M25671 Stiffness of right ankle, not elsewhere classified: Secondary | ICD-10-CM | POA: Diagnosis not present

## 2022-09-30 NOTE — Telephone Encounter (Signed)
Ordered

## 2022-09-30 NOTE — Therapy (Signed)
OUTPATIENT PHYSICAL THERAPY LOWER EXTREMITY TREATMENT   Patient Name: Katie Allen MRN: [id number] DOB:08-28-1950, 72 y.o., female Today's Date: 09/30/2022  END OF SESSION:  PT End of Session - 09/30/22 0848     Visit Number 3    Number of Visits 8    Date for PT Re-Evaluation 12/03/22    PT Start Time 0845    PT Stop Time 0927    PT Time Calculation (min) 42 min    Activity Tolerance Patient tolerated treatment well    Behavior During Therapy [facility] for tasks assessed/performed              Past Medical History:  Diagnosis Date   Hashimoto's thyroiditis    Heart palpitations    Hyperlipidemia    x years   Hypertension    x years   Shingles    Squamous carcinoma    L arm    TIA (transient ischemic attack)    Past Surgical History:  Procedure Laterality Date   CHOLECYSTECTOMY     Patient Active Problem List   Diagnosis Date Noted   Acute deep vein thrombosis (DVT) of right peroneal vein (HCC) 08/24/2022   Overweight 02/13/2019   Educated about COVID-19 virus infection 02/13/2019   Family history of sudden death Dec 22, 2017   Palpitations 12-22-17   Gastroesophageal reflux disease without esophagitis 02/18/2017   Muscle tension dysphonia 02/18/2017   Tongue lesion 02/18/2017   TIA (transient ischemic attack) 09/06/2011   Hypertension 05/06/2010   BPPV (benign paroxysmal positional vertigo) 05/06/2010   Hyperlipemia 05/06/2010   Hashimoto's disease 05/06/2010   Hypothyroid 05/06/2010   Vasomotor flushing 05/06/2010   OBESITY, UNSPECIFIED 09/17/2009   PREMATURE VENTRICULAR CONTRACTIONS 09/17/2009   DYSPNEA 09/17/2009    PCP: Margaree Mackintosh, MD  REFERRING PROVIDER: Cristie Hem, PA-C   REFERRING DIAG: Closed right ankle fracture, initial encounter   THERAPY DIAG:  Pain in right ankle and joints of right foot  Stiffness of right ankle, not elsewhere classified  Localized edema  Rationale for Evaluation and Treatment:  Rehabilitation  ONSET DATE: 08/15/22  SUBJECTIVE:   SUBJECTIVE STATEMENT: Patient reports that she felt alright after her last appointment. She did use ice after her last appointment "just in case."   PERTINENT HISTORY: Hypertension, history of TIA, and DVT PAIN:  Are you having pain? Yes: NPRS scale: 1-2/10 Pain location: right ankle Pain description: intermittent stabbing pain  Aggravating factors: a lot of pressure on her foot Relieving factors: elevating her foot  PRECAUTIONS: Other: DVT  RED FLAGS: None   WEIGHT BEARING RESTRICTIONS: No  FALLS:  Has patient fallen in last 6 months? Yes. Number of falls 1  LIVING ENVIRONMENT: Lives with: lives with their spouse Lives in: House/apartment Stairs: Yes: Internal: 1 flight steps; can reach both and External: 3 steps; can reach both Has following equipment at home: None  OCCUPATION: office manager  PLOF: Independent  PATIENT GOALS: be able to garden, walk better, and back to her daily activities  NEXT MD VISIT: 10/19/22  OBJECTIVE:   DIAGNOSTIC FINDINGS: 09/14/22 right ankle x-ray  X-rays demonstrate considerable consolidation of the fracture site  PATIENT SURVEYS:  FOTO to be completed at first follow up   COGNITION: Overall cognitive status: Within functional limits for tasks assessed     SENSATION: Patient reports diminished sensation in the 4th and 5th toes of her right foot.   EDEMA:  Figure 8: R: 57.5 cm L: 55 cm   PALPATION: TTP: right anterior  tibialis and distal tibia   JOINT MOBILITY:  Right subtalar: WFL and nonpainful   Right midfoot: WFL and nonpainful   Right distal fibular: WFL and nonpainful  Right talocrural: WFL and painful   LOWER EXTREMITY ROM:  Active ROM Right eval Left eval  Hip flexion    Hip extension    Hip abduction    Hip adduction    Hip internal rotation    Hip external rotation    Knee flexion    Knee extension    Ankle dorsiflexion 3; tight  3  Ankle  plantarflexion 28; slight pain and tight  58  Ankle inversion 30; painful  27  Ankle eversion 13; increased pain  13   (Blank rows = not tested)  LOWER EXTREMITY MMT: not tested at this time   MMT Right eval Left eval  Hip flexion    Hip extension    Hip abduction    Hip adduction    Hip internal rotation    Hip external rotation    Knee flexion    Knee extension    Ankle dorsiflexion    Ankle plantarflexion    Ankle inversion    Ankle eversion     (Blank rows = not tested)  GAIT: Assistive device utilized:  Right CAM boot Level of assistance: Modified independence Comments: decreased gait speed and stride length secondary to CAM boot    TODAY'S TREATMENT:                                                                                                                              DATE:                                     09/30/22 EXERCISE LOG  Exercise Repetitions and Resistance Comments  Nustep  L6 x 14 minutes    Standing gastroc stretch  2 minutes   Standing soleus stretch  2 minutes    Marching on foam  2 minutes BUE support  Tandem stance on foam  3 x 30 seconds each  Intermittent UE support   Standing toe raises  20 reps    Resisted PF  Green t-band x 3 minutes  RLE only  Resisted eversion Green t-band x 2 minutes    Blank cell = exercise not performed today                                    09/28/22 EXERCISE LOG  Exercise Repetitions and Resistance Comments  Nustep  L4-5 x 11 minutes   Rocker board (seated) 4 minutes DF/PF  Rocker board (seated, lateral)  1.5 minutes   Toe curls 2 minutes   Standing weight shift 2 minutes   Circles on dynadisc (seated)  3 minutes  CW and CCW  Static  stance on foam  3 minutes  Narrow BOS   Blank cell = exercise not performed today  Manual Therapy Soft Tissue Mobilization: right fibularis longus and brevis, for improved soft tissue extensibility    PATIENT EDUCATION:  Education details: POC, healing, prognosis,  objective findings, and goals for therapy  Person educated: Patient Education method: Explanation Education comprehension: verbalized understanding  HOME EXERCISE PROGRAM:   ASSESSMENT:  CLINICAL IMPRESSION: Patient was progressed with multiple new and familiar interventions for improved right ankle mobility and stability. She required minimal cueing with resisted ankle eversion for proper exercise performance to facilitate fibularis engagement. She experienced no significant increase in pain or discomfort with any of today's interventions. She reported a mild increase in right ankle redness and edema upon the conclusion of treatment, but noted that it felt good otherwise. She continues to require skilled physical therapy to address her remaining impairments to return to her prior level of function.   OBJECTIVE IMPAIRMENTS: Abnormal gait, decreased activity tolerance, decreased balance, decreased mobility, difficulty walking, decreased ROM, decreased strength, increased edema, impaired sensation, impaired tone, and pain.   ACTIVITY LIMITATIONS: carrying, lifting, standing, squatting, stairs, and locomotion level  PARTICIPATION LIMITATIONS: meal prep, cleaning, laundry, shopping, and community activity  PERSONAL FACTORS: Time since onset of injury/illness/exacerbation and 3+ comorbidities: Hypertension, history of TIA, and DVT  are also affecting patient's functional outcome.   REHAB POTENTIAL: Good  CLINICAL DECISION MAKING: Evolving/moderate complexity  EVALUATION COMPLEXITY: Moderate   GOALS: Goals reviewed with patient? Yes  LONG TERM GOALS: Target date: 10/21/22  Patient will be independent with her HEP.  Baseline:  Goal status: INITIAL  2.  Patient will be able to ambulate with no significant gait deviations.  Baseline:  Goal status: INITIAL  3.  Patient will be able to demonstrate proper lifting mechanics for improved function gardening.  Baseline:  Goal status:  INITIAL  4.  Patient will improve he right ankle plantarflexion to at least 38 degrees for improved ankle mobility.  Baseline:  Goal status: INITIAL  PLAN:  PT FREQUENCY: 2x/week  PT DURATION: 4 weeks  PLANNED INTERVENTIONS: Therapeutic exercises, Therapeutic activity, Neuromuscular re-education, Balance training, Gait training, Patient/Family education, Self Care, Joint mobilization, Stair training, Electrical stimulation, Cryotherapy, Moist heat, Manual therapy, and Re-evaluation  PLAN FOR NEXT SESSION: Nustep, ankle AROM, progressing out of CAM boot into brace   Granville Lewis, PT 09/30/2022, 9:31 AM

## 2022-09-30 NOTE — Telephone Encounter (Signed)
Tomma Lightning [employer] [phone]  Tomma Lightning called to see if you could order a Bone Density for Katie Allen since she has had a recent fall with a fracture. She stated it did not appear [name] has ever had a Bone Density.

## 2022-09-30 NOTE — Telephone Encounter (Signed)
Patient was notified, she will call to schedule

## 2022-10-05 ENCOUNTER — Other Ambulatory Visit: Payer: Self-pay | Admitting: Internal Medicine

## 2022-10-05 ENCOUNTER — Ambulatory Visit: Payer: Medicare HMO

## 2022-10-05 DIAGNOSIS — Z1231 Encounter for screening mammogram for malignant neoplasm of breast: Secondary | ICD-10-CM

## 2022-10-05 DIAGNOSIS — M25671 Stiffness of right ankle, not elsewhere classified: Secondary | ICD-10-CM | POA: Diagnosis not present

## 2022-10-05 DIAGNOSIS — S82891A Other fracture of right lower leg, initial encounter for closed fracture: Secondary | ICD-10-CM | POA: Diagnosis not present

## 2022-10-05 DIAGNOSIS — M25571 Pain in right ankle and joints of right foot: Secondary | ICD-10-CM | POA: Diagnosis not present

## 2022-10-05 DIAGNOSIS — R600 Localized edema: Secondary | ICD-10-CM | POA: Diagnosis not present

## 2022-10-05 NOTE — Therapy (Signed)
OUTPATIENT PHYSICAL THERAPY LOWER EXTREMITY TREATMENT   Patient Name: Katie Allen MRN: [id number] DOB:Oct 13, 1950, 72 y.o., female Today's Date: 10/05/2022  END OF SESSION:  PT End of Session - 10/05/22 1608     Visit Number 4    Number of Visits 8    Date for PT Re-Evaluation 12/03/22    PT Start Time 1600    PT Stop Time 1643    PT Time Calculation (min) 43 min    Activity Tolerance Patient tolerated treatment well    Behavior During Therapy [facility] for tasks assessed/performed               Past Medical History:  Diagnosis Date   Hashimoto's thyroiditis    Heart palpitations    Hyperlipidemia    x years   Hypertension    x years   Shingles    Squamous carcinoma    L arm    TIA (transient ischemic attack)    Past Surgical History:  Procedure Laterality Date   CHOLECYSTECTOMY     Patient Active Problem List   Diagnosis Date Noted   Acute deep vein thrombosis (DVT) of right peroneal vein (HCC) 08/24/2022   Overweight 02/13/2019   Educated about COVID-19 virus infection 02/13/2019   Family history of sudden death 2018/01/15   Palpitations 01-15-2018   Gastroesophageal reflux disease without esophagitis 02/18/2017   Muscle tension dysphonia 02/18/2017   Tongue lesion 02/18/2017   TIA (transient ischemic attack) 09/06/2011   Hypertension 05/06/2010   BPPV (benign paroxysmal positional vertigo) 05/06/2010   Hyperlipemia 05/06/2010   Hashimoto's disease 05/06/2010   Hypothyroid 05/06/2010   Vasomotor flushing 05/06/2010   OBESITY, UNSPECIFIED 09/17/2009   PREMATURE VENTRICULAR CONTRACTIONS 09/17/2009   DYSPNEA 09/17/2009    PCP: Margaree Mackintosh, MD  REFERRING PROVIDER: Cristie Hem, PA-C   REFERRING DIAG: Closed right ankle fracture, initial encounter   THERAPY DIAG:  Pain in right ankle and joints of right foot  Stiffness of right ankle, not elsewhere classified  Localized edema  Rationale for Evaluation and Treatment:  Rehabilitation  ONSET DATE: 08/15/22  SUBJECTIVE:   SUBJECTIVE STATEMENT: Patient reports that she feels better in the ankle brace than she did in the boot. She does have some tightness at the end of the day, but no pain.   PERTINENT HISTORY: Hypertension, history of TIA, and DVT PAIN:  Are you having pain? Yes: NPRS scale: 0/10 Pain location: right ankle Pain description: intermittent stabbing pain  Aggravating factors: a lot of pressure on her foot Relieving factors: elevating her foot  PRECAUTIONS: Other: DVT  RED FLAGS: None   WEIGHT BEARING RESTRICTIONS: No  FALLS:  Has patient fallen in last 6 months? Yes. Number of falls 1  LIVING ENVIRONMENT: Lives with: lives with their spouse Lives in: House/apartment Stairs: Yes: Internal: 1 flight steps; can reach both and External: 3 steps; can reach both Has following equipment at home: None  OCCUPATION: office manager  PLOF: Independent  PATIENT GOALS: be able to garden, walk better, and back to her daily activities  NEXT MD VISIT: 10/19/22  OBJECTIVE:   DIAGNOSTIC FINDINGS: 09/14/22 right ankle x-ray  X-rays demonstrate considerable consolidation of the fracture site  PATIENT SURVEYS:  FOTO to be completed at first follow up   COGNITION: Overall cognitive status: Within functional limits for tasks assessed     SENSATION: Patient reports diminished sensation in the 4th and 5th toes of her right foot.   EDEMA:  Figure 8: R: 57.5  cm L: 55 cm   PALPATION: TTP: right anterior tibialis and distal tibia   JOINT MOBILITY:  Right subtalar: WFL and nonpainful   Right midfoot: WFL and nonpainful   Right distal fibular: WFL and nonpainful  Right talocrural: WFL and painful   LOWER EXTREMITY ROM:  Active ROM Right eval Left eval  Hip flexion    Hip extension    Hip abduction    Hip adduction    Hip internal rotation    Hip external rotation    Knee flexion    Knee extension    Ankle dorsiflexion 3; tight   3  Ankle plantarflexion 28; slight pain and tight  58  Ankle inversion 30; painful  27  Ankle eversion 13; increased pain  13   (Blank rows = not tested)  LOWER EXTREMITY MMT: not tested at this time   MMT Right eval Left eval  Hip flexion    Hip extension    Hip abduction    Hip adduction    Hip internal rotation    Hip external rotation    Knee flexion    Knee extension    Ankle dorsiflexion    Ankle plantarflexion    Ankle inversion    Ankle eversion     (Blank rows = not tested)  GAIT: Assistive device utilized:  Right CAM boot Level of assistance: Modified independence Comments: decreased gait speed and stride length secondary to CAM boot    TODAY'S TREATMENT:                                                                                                                              DATE:                                     10/05/22 EXERCISE LOG  Exercise Repetitions and Resistance Comments  Nustep  L5 x 15 minutes   Rocker board  4 minutes   Tandem on foam  3 x 30 seconds each  Slight discomfort initially, but improved as the exercise continued; intermittent UE support   Eccentric heel tap  6" step x 30 reps  RLE on step   Wall squat  30 reps    Resisted PF  Blue t-band x 3 minutes   Resisted inversion  Blue t-band x 3 minutes    Blank cell = exercise not performed today                                    09/30/22 EXERCISE LOG  Exercise Repetitions and Resistance Comments  Nustep  L6 x 14 minutes    Standing gastroc stretch  2 minutes   Standing soleus stretch  2 minutes    Marching on foam  2 minutes BUE support  Tandem stance on foam  3 x 30 seconds each  Intermittent UE support   Standing toe raises  20 reps    Resisted PF  Green t-band x 3 minutes  RLE only  Resisted eversion Green t-band x 2 minutes    Blank cell = exercise not performed today                                    09/28/22 EXERCISE LOG  Exercise Repetitions and Resistance Comments   Nustep  L4-5 x 11 minutes   Rocker board (seated) 4 minutes DF/PF  Rocker board (seated, lateral)  1.5 minutes   Toe curls 2 minutes   Standing weight shift 2 minutes   Circles on dynadisc (seated)  3 minutes  CW and CCW  Static stance on foam  3 minutes  Narrow BOS   Blank cell = exercise not performed today  Manual Therapy Soft Tissue Mobilization: right fibularis longus and brevis, for improved soft tissue extensibility    PATIENT EDUCATION:  Education details: POC, healing, prognosis, objective findings, and goals for therapy  Person educated: Patient Education method: Explanation Education comprehension: verbalized understanding  HOME EXERCISE PROGRAM:   ASSESSMENT:  CLINICAL IMPRESSION: Patient was progressed with resisted ankle inversion and other standing interventions for improved right ankle strength and stability needed for functional activities. She required minimal cueing with eccentric step downs for improved eccentric control to simulate descending stairs. She experienced no pain or discomfort with any of today's interventions. She reported that her ankle felt good upon the conclusion of treatment. She continues to require skilled physical therapy to address her remaining impairments to return to her prior level of function.   OBJECTIVE IMPAIRMENTS: Abnormal gait, decreased activity tolerance, decreased balance, decreased mobility, difficulty walking, decreased ROM, decreased strength, increased edema, impaired sensation, impaired tone, and pain.   ACTIVITY LIMITATIONS: carrying, lifting, standing, squatting, stairs, and locomotion level  PARTICIPATION LIMITATIONS: meal prep, cleaning, laundry, shopping, and community activity  PERSONAL FACTORS: Time since onset of injury/illness/exacerbation and 3+ comorbidities: Hypertension, history of TIA, and DVT  are also affecting patient's functional outcome.   REHAB POTENTIAL: Good  CLINICAL DECISION MAKING:  Evolving/moderate complexity  EVALUATION COMPLEXITY: Moderate   GOALS: Goals reviewed with patient? Yes  LONG TERM GOALS: Target date: 10/21/22  Patient will be independent with her HEP.  Baseline:  Goal status: INITIAL  2.  Patient will be able to ambulate with no significant gait deviations.  Baseline:  Goal status: INITIAL  3.  Patient will be able to demonstrate proper lifting mechanics for improved function gardening.  Baseline:  Goal status: INITIAL  4.  Patient will improve he right ankle plantarflexion to at least 38 degrees for improved ankle mobility.  Baseline:  Goal status: INITIAL  PLAN:  PT FREQUENCY: 2x/week  PT DURATION: 4 weeks  PLANNED INTERVENTIONS: Therapeutic exercises, Therapeutic activity, Neuromuscular re-education, Balance training, Gait training, Patient/Family education, Self Care, Joint mobilization, Stair training, Electrical stimulation, Cryotherapy, Moist heat, Manual therapy, and Re-evaluation  PLAN FOR NEXT SESSION: Nustep, ankle AROM, progressing out of CAM boot into brace   Granville Lewis, PT 10/05/2022, 6:12 PM

## 2022-10-07 ENCOUNTER — Ambulatory Visit: Payer: Medicare HMO

## 2022-10-07 DIAGNOSIS — R600 Localized edema: Secondary | ICD-10-CM | POA: Diagnosis not present

## 2022-10-07 DIAGNOSIS — M25571 Pain in right ankle and joints of right foot: Secondary | ICD-10-CM

## 2022-10-07 DIAGNOSIS — M25671 Stiffness of right ankle, not elsewhere classified: Secondary | ICD-10-CM

## 2022-10-07 DIAGNOSIS — S82891A Other fracture of right lower leg, initial encounter for closed fracture: Secondary | ICD-10-CM | POA: Diagnosis not present

## 2022-10-07 NOTE — Therapy (Signed)
OUTPATIENT PHYSICAL THERAPY LOWER EXTREMITY TREATMENT   Patient Name: Katie Allen MRN: [id number] DOB:1950/05/03, 72 y.o., female Today's Date: 10/07/2022  END OF SESSION:  PT End of Session - 10/07/22 0818     Visit Number 5    Number of Visits 8    Date for PT Re-Evaluation 12/03/22    PT Start Time 0800    PT Stop Time 0842    PT Time Calculation (min) 42 min    Activity Tolerance Patient tolerated treatment well    Behavior During Therapy [facility] for tasks assessed/performed                Past Medical History:  Diagnosis Date   Hashimoto's thyroiditis    Heart palpitations    Hyperlipidemia    x years   Hypertension    x years   Shingles    Squamous carcinoma    L arm    TIA (transient ischemic attack)    Past Surgical History:  Procedure Laterality Date   CHOLECYSTECTOMY     Patient Active Problem List   Diagnosis Date Noted   Acute deep vein thrombosis (DVT) of right peroneal vein (HCC) 08/24/2022   Overweight 02/13/2019   Educated about COVID-19 virus infection 02/13/2019   Family history of sudden death Jan 10, 2018   Palpitations 01/10/2018   Gastroesophageal reflux disease without esophagitis 02/18/2017   Muscle tension dysphonia 02/18/2017   Tongue lesion 02/18/2017   TIA (transient ischemic attack) 09/06/2011   Hypertension 05/06/2010   BPPV (benign paroxysmal positional vertigo) 05/06/2010   Hyperlipemia 05/06/2010   Hashimoto's disease 05/06/2010   Hypothyroid 05/06/2010   Vasomotor flushing 05/06/2010   OBESITY, UNSPECIFIED 09/17/2009   PREMATURE VENTRICULAR CONTRACTIONS 09/17/2009   DYSPNEA 09/17/2009    PCP: Margaree Mackintosh, MD  REFERRING PROVIDER: Cristie Hem, PA-C   REFERRING DIAG: Closed right ankle fracture, initial encounter   THERAPY DIAG:  Pain in right ankle and joints of right foot  Stiffness of right ankle, not elsewhere classified  Localized edema  Rationale for Evaluation and Treatment:  Rehabilitation  ONSET DATE: 08/15/22  SUBJECTIVE:   SUBJECTIVE STATEMENT: Patient reports that her ankle feels good today.   PERTINENT HISTORY: Hypertension, history of TIA, and DVT PAIN:  Are you having pain? Yes: NPRS scale: 0/10 Pain location: right ankle Pain description: intermittent stabbing pain  Aggravating factors: a lot of pressure on her foot Relieving factors: elevating her foot  PRECAUTIONS: Other: DVT  RED FLAGS: None   WEIGHT BEARING RESTRICTIONS: No  FALLS:  Has patient fallen in last 6 months? Yes. Number of falls 1  LIVING ENVIRONMENT: Lives with: lives with their spouse Lives in: House/apartment Stairs: Yes: Internal: 1 flight steps; can reach both and External: 3 steps; can reach both Has following equipment at home: None  OCCUPATION: office manager  PLOF: Independent  PATIENT GOALS: be able to garden, walk better, and back to her daily activities  NEXT MD VISIT: 10/19/22  OBJECTIVE:   DIAGNOSTIC FINDINGS: 09/14/22 right ankle x-ray  X-rays demonstrate considerable consolidation of the fracture site  PATIENT SURVEYS:  FOTO to be completed at first follow up   COGNITION: Overall cognitive status: Within functional limits for tasks assessed     SENSATION: Patient reports diminished sensation in the 4th and 5th toes of her right foot.   EDEMA:  Figure 8: R: 57.5 cm L: 55 cm   PALPATION: TTP: right anterior tibialis and distal tibia   JOINT MOBILITY:  Right subtalar:  WFL and nonpainful   Right midfoot: WFL and nonpainful   Right distal fibular: WFL and nonpainful  Right talocrural: WFL and painful   LOWER EXTREMITY ROM:  Active ROM Right eval Left eval  Hip flexion    Hip extension    Hip abduction    Hip adduction    Hip internal rotation    Hip external rotation    Knee flexion    Knee extension    Ankle dorsiflexion 3; tight  3  Ankle plantarflexion 28; slight pain and tight  58  Ankle inversion 30; painful  27  Ankle  eversion 13; increased pain  13   (Blank rows = not tested)  LOWER EXTREMITY MMT: not tested at this time   MMT Right eval Left eval  Hip flexion    Hip extension    Hip abduction    Hip adduction    Hip internal rotation    Hip external rotation    Knee flexion    Knee extension    Ankle dorsiflexion    Ankle plantarflexion    Ankle inversion    Ankle eversion     (Blank rows = not tested)  GAIT: Assistive device utilized:  Right CAM boot Level of assistance: Modified independence Comments: decreased gait speed and stride length secondary to CAM boot    TODAY'S TREATMENT:                                                                                                                              DATE:                                     10/07/22 EXERCISE LOG  Exercise Repetitions and Resistance Comments  Nustep  L6 x 12 minutes    Rocker board  4 minutes each  DF/PF and inversion/eversion; For ankle mobility   Step up onto BOSU 2 minutes  BUE support  Static stance on foam  2 minutes Narrow BOS on BOSU (ball up); intermittent UE support  Standing toe raises  2 minutes     Blank cell = exercise not performed today  Manual Therapy Soft Tissue Mobilization: fibularis longus, for improved soft tissue extensibility Joint Mobilizations: distal fibular, grade I-III Passive ROM: all planes, to tolerance                                     10/05/22 EXERCISE LOG  Exercise Repetitions and Resistance Comments  Nustep  L5 x 15 minutes   Rocker board  4 minutes   Tandem on foam  3 x 30 seconds each  Slight discomfort initially, but improved as the exercise continued; intermittent UE support   Eccentric heel tap  6" step x 30 reps  RLE on step   Wall squat  30 reps    Resisted PF  Blue t-band x 3 minutes   Resisted inversion  Blue t-band x 3 minutes    Blank cell = exercise not performed today                                    09/30/22 EXERCISE LOG  Exercise Repetitions and  Resistance Comments  Nustep  L6 x 14 minutes    Standing gastroc stretch  2 minutes   Standing soleus stretch  2 minutes    Marching on foam  2 minutes BUE support  Tandem stance on foam  3 x 30 seconds each  Intermittent UE support   Standing toe raises  20 reps    Resisted PF  Green t-band x 3 minutes  RLE only  Resisted eversion Green t-band x 2 minutes    Blank cell = exercise not performed today   PATIENT EDUCATION:  Education details: POC, healing, prognosis, objective findings, and goals for therapy  Person educated: Patient Education method: Explanation Education comprehension: verbalized understanding  HOME EXERCISE PROGRAM:   ASSESSMENT:  CLINICAL IMPRESSION: Patient was progressed with multiple new and familiar interventions for improved ankle mobility and stability. She required minimal cueing with today's new interventions for proper exercise performance. Manual therapy focused on distal fibular joint mobilizations for improved ankle mobility. She reported feeling better upon the conclusion of treatment. She continues to require skilled physical therapy to address her remaining impairments to return to her prior level of function.   OBJECTIVE IMPAIRMENTS: Abnormal gait, decreased activity tolerance, decreased balance, decreased mobility, difficulty walking, decreased ROM, decreased strength, increased edema, impaired sensation, impaired tone, and pain.   ACTIVITY LIMITATIONS: carrying, lifting, standing, squatting, stairs, and locomotion level  PARTICIPATION LIMITATIONS: meal prep, cleaning, laundry, shopping, and community activity  PERSONAL FACTORS: Time since onset of injury/illness/exacerbation and 3+ comorbidities: Hypertension, history of TIA, and DVT  are also affecting patient's functional outcome.   REHAB POTENTIAL: Good  CLINICAL DECISION MAKING: Evolving/moderate complexity  EVALUATION COMPLEXITY: Moderate   GOALS: Goals reviewed with patient?  Yes  LONG TERM GOALS: Target date: 10/21/22  Patient will be independent with her HEP.  Baseline:  Goal status: INITIAL  2.  Patient will be able to ambulate with no significant gait deviations.  Baseline:  Goal status: INITIAL  3.  Patient will be able to demonstrate proper lifting mechanics for improved function gardening.  Baseline:  Goal status: INITIAL  4.  Patient will improve he right ankle plantarflexion to at least 38 degrees for improved ankle mobility.  Baseline:  Goal status: INITIAL  PLAN:  PT FREQUENCY: 2x/week  PT DURATION: 4 weeks  PLANNED INTERVENTIONS: Therapeutic exercises, Therapeutic activity, Neuromuscular re-education, Balance training, Gait training, Patient/Family education, Self Care, Joint mobilization, Stair training, Electrical stimulation, Cryotherapy, Moist heat, Manual therapy, and Re-evaluation  PLAN FOR NEXT SESSION: Nustep, ankle AROM, progressing out of CAM boot into brace   Granville Lewis, PT 10/07/2022, 10:13 AM

## 2022-10-11 ENCOUNTER — Other Ambulatory Visit: Payer: Self-pay | Admitting: Family

## 2022-10-11 ENCOUNTER — Other Ambulatory Visit: Payer: Self-pay | Admitting: Internal Medicine

## 2022-10-12 ENCOUNTER — Ambulatory Visit: Payer: Medicare HMO | Attending: Physician Assistant

## 2022-10-12 DIAGNOSIS — M25671 Stiffness of right ankle, not elsewhere classified: Secondary | ICD-10-CM | POA: Insufficient documentation

## 2022-10-12 DIAGNOSIS — M25571 Pain in right ankle and joints of right foot: Secondary | ICD-10-CM | POA: Insufficient documentation

## 2022-10-12 DIAGNOSIS — R600 Localized edema: Secondary | ICD-10-CM | POA: Diagnosis not present

## 2022-10-12 NOTE — Therapy (Signed)
OUTPATIENT PHYSICAL THERAPY LOWER EXTREMITY TREATMENT   Patient Name: Katie Allen MRN: [id number] DOB:1950-11-06, 72 y.o., female Today's Date: 10/12/2022  END OF SESSION:  PT End of Session - 10/12/22 0853     Visit Number 6    Number of Visits 8    Date for PT Re-Evaluation 12/03/22    PT Start Time 0845    PT Stop Time 0927    PT Time Calculation (min) 42 min    Activity Tolerance Patient tolerated treatment well    Behavior During Therapy [facility] for tasks assessed/performed                 Past Medical History:  Diagnosis Date   Hashimoto's thyroiditis    Heart palpitations    Hyperlipidemia    x years   Hypertension    x years   Shingles    Squamous carcinoma    L arm    TIA (transient ischemic attack)    Past Surgical History:  Procedure Laterality Date   CHOLECYSTECTOMY     Patient Active Problem List   Diagnosis Date Noted   Acute deep vein thrombosis (DVT) of right peroneal vein (HCC) 08/24/2022   Overweight 02/13/2019   Educated about COVID-19 virus infection 02/13/2019   Family history of sudden death December 30, 2017   Palpitations 2017-12-30   Gastroesophageal reflux disease without esophagitis 02/18/2017   Muscle tension dysphonia 02/18/2017   Tongue lesion 02/18/2017   TIA (transient ischemic attack) 09/06/2011   Hypertension 05/06/2010   BPPV (benign paroxysmal positional vertigo) 05/06/2010   Hyperlipemia 05/06/2010   Hashimoto's disease 05/06/2010   Hypothyroid 05/06/2010   Vasomotor flushing 05/06/2010   OBESITY, UNSPECIFIED 09/17/2009   PREMATURE VENTRICULAR CONTRACTIONS 09/17/2009   DYSPNEA 09/17/2009    PCP: Margaree Mackintosh, MD  REFERRING PROVIDER: Cristie Hem, PA-C   REFERRING DIAG: Closed right ankle fracture, initial encounter   THERAPY DIAG:  Pain in right ankle and joints of right foot  Stiffness of right ankle, not elsewhere classified  Localized edema  Rationale for Evaluation and Treatment:  Rehabilitation  ONSET DATE: 08/15/22  SUBJECTIVE:   SUBJECTIVE STATEMENT: Patient reports that she feels good right now, but she does have a little soreness when trying to walk normally. She also noticed some brief random pain when she is laying in bed at night, but this only lasts milliseconds.   PERTINENT HISTORY: Hypertension, history of TIA, and DVT PAIN:  Are you having pain? Yes: NPRS scale: 0/10 Pain location: right ankle Pain description: intermittent stabbing pain  Aggravating factors: a lot of pressure on her foot Relieving factors: elevating her foot  PRECAUTIONS: Other: DVT  RED FLAGS: None   WEIGHT BEARING RESTRICTIONS: No  FALLS:  Has patient fallen in last 6 months? Yes. Number of falls 1  LIVING ENVIRONMENT: Lives with: lives with their spouse Lives in: House/apartment Stairs: Yes: Internal: 1 flight steps; can reach both and External: 3 steps; can reach both Has following equipment at home: None  OCCUPATION: office manager  PLOF: Independent  PATIENT GOALS: be able to garden, walk better, and back to her daily activities  NEXT MD VISIT: 10/19/22  OBJECTIVE:   DIAGNOSTIC FINDINGS: 09/14/22 right ankle x-ray  X-rays demonstrate considerable consolidation of the fracture site  PATIENT SURVEYS:  FOTO to be completed at first follow up   COGNITION: Overall cognitive status: Within functional limits for tasks assessed     SENSATION: Patient reports diminished sensation in the 4th and 5th toes  of her right foot.   EDEMA:  Figure 8: R: 57.5 cm L: 55 cm   PALPATION: TTP: right anterior tibialis and distal tibia   JOINT MOBILITY:  Right subtalar: WFL and nonpainful   Right midfoot: WFL and nonpainful   Right distal fibular: WFL and nonpainful  Right talocrural: WFL and painful   LOWER EXTREMITY ROM:  Active ROM Right eval Left eval  Hip flexion    Hip extension    Hip abduction    Hip adduction    Hip internal rotation    Hip external  rotation    Knee flexion    Knee extension    Ankle dorsiflexion 3; tight  3  Ankle plantarflexion 28; slight pain and tight  58  Ankle inversion 30; painful  27  Ankle eversion 13; increased pain  13   (Blank rows = not tested)  LOWER EXTREMITY MMT: not tested at this time   MMT Right eval Left eval  Hip flexion    Hip extension    Hip abduction    Hip adduction    Hip internal rotation    Hip external rotation    Knee flexion    Knee extension    Ankle dorsiflexion    Ankle plantarflexion    Ankle inversion    Ankle eversion     (Blank rows = not tested)  GAIT: Assistive device utilized:  Right CAM boot Level of assistance: Modified independence Comments: decreased gait speed and stride length secondary to CAM boot    TODAY'S TREATMENT:                                                                                                                              DATE:                                     10/12/22 EXERCISE LOG  Exercise Repetitions and Resistance Comments  Nustep  L6 x 15 minutes   Rocker board  2 minutes   Stepping strategy 30 reps    Great toe stretch  10 reps w/ 15 second hold  Added to HEP   Self toe extension stretch   Added to HEP   Self STM with tennis ball  Added to HEP   Tandem stance on foam  4  x 30 seconds each  Intermittent UE support  Marching on foam  2.5 minutes    Blank cell = exercise not performed today                                    10/07/22 EXERCISE LOG  Exercise Repetitions and Resistance Comments  Nustep  L6 x 12 minutes    Rocker board  4 minutes each  DF/PF and inversion/eversion; For ankle mobility   Step  up onto BOSU 2 minutes  BUE support  Static stance on foam  2 minutes Narrow BOS on BOSU (ball up); intermittent UE support  Standing toe raises  2 minutes     Blank cell = exercise not performed today  Manual Therapy Soft Tissue Mobilization: fibularis longus, for improved soft tissue extensibility Joint  Mobilizations: distal fibular, grade I-III Passive ROM: all planes, to tolerance                                     10/05/22 EXERCISE LOG  Exercise Repetitions and Resistance Comments  Nustep  L5 x 15 minutes   Rocker board  4 minutes   Tandem on foam  3 x 30 seconds each  Slight discomfort initially, but improved as the exercise continued; intermittent UE support   Eccentric heel tap  6" step x 30 reps  RLE on step   Wall squat  30 reps    Resisted PF  Blue t-band x 3 minutes   Resisted inversion  Blue t-band x 3 minutes    Blank cell = exercise not performed today   PATIENT EDUCATION:  Education details: healing, prognosis, and HEP Person educated: Patient Education method: Explanation Education comprehension: verbalized understanding  HOME EXERCISE PROGRAM:   ASSESSMENT:  CLINICAL IMPRESSION: Patient was progressed with multiple new and familiar interventions for improved gait mechanics and ankle stability. She required minimal cueing with proper gait mechanics to facilitate toe off on her right foot. She experienced a mild increase in discomfort with right foot toe off, but this did not inhibit her ability to complete any of today's interventions. She reported feeling good upon the conclusion of treatment. She continues to require skilled physical therapy to address her remaining impairments to return to her prior level of function.    OBJECTIVE IMPAIRMENTS: Abnormal gait, decreased activity tolerance, decreased balance, decreased mobility, difficulty walking, decreased ROM, decreased strength, increased edema, impaired sensation, impaired tone, and pain.   ACTIVITY LIMITATIONS: carrying, lifting, standing, squatting, stairs, and locomotion level  PARTICIPATION LIMITATIONS: meal prep, cleaning, laundry, shopping, and community activity  PERSONAL FACTORS: Time since onset of injury/illness/exacerbation and 3+ comorbidities: Hypertension, history of TIA, and DVT  are also  affecting patient's functional outcome.   REHAB POTENTIAL: Good  CLINICAL DECISION MAKING: Evolving/moderate complexity  EVALUATION COMPLEXITY: Moderate   GOALS: Goals reviewed with patient? Yes  LONG TERM GOALS: Target date: 10/21/22  Patient will be independent with her HEP.  Baseline:  Goal status: INITIAL  2.  Patient will be able to ambulate with no significant gait deviations.  Baseline:  Goal status: INITIAL  3.  Patient will be able to demonstrate proper lifting mechanics for improved function gardening.  Baseline:  Goal status: INITIAL  4.  Patient will improve he right ankle plantarflexion to at least 38 degrees for improved ankle mobility.  Baseline:  Goal status: INITIAL  PLAN:  PT FREQUENCY: 2x/week  PT DURATION: 4 weeks  PLANNED INTERVENTIONS: Therapeutic exercises, Therapeutic activity, Neuromuscular re-education, Balance training, Gait training, Patient/Family education, Self Care, Joint mobilization, Stair training, Electrical stimulation, Cryotherapy, Moist heat, Manual therapy, and Re-evaluation  PLAN FOR NEXT SESSION: Nustep, ankle AROM, progressing out of CAM boot into brace   Granville Lewis, PT 10/12/2022, 9:40 AM

## 2022-10-14 ENCOUNTER — Ambulatory Visit: Payer: Medicare HMO

## 2022-10-14 DIAGNOSIS — M25671 Stiffness of right ankle, not elsewhere classified: Secondary | ICD-10-CM | POA: Diagnosis not present

## 2022-10-14 DIAGNOSIS — M25571 Pain in right ankle and joints of right foot: Secondary | ICD-10-CM | POA: Diagnosis not present

## 2022-10-14 DIAGNOSIS — R600 Localized edema: Secondary | ICD-10-CM

## 2022-10-14 NOTE — Therapy (Signed)
OUTPATIENT PHYSICAL THERAPY LOWER EXTREMITY TREATMENT   Patient Name: Katie Allen MRN: [id number] DOB:1950-11-21, 72 y.o., female Today's Date: 10/14/2022  END OF SESSION:  PT End of Session - 10/14/22 0805     Visit Number 7    Number of Visits 8    Date for PT Re-Evaluation 12/03/22    PT Start Time 0801    PT Stop Time 0839    PT Time Calculation (min) 38 min    Activity Tolerance Patient tolerated treatment well    Behavior During Therapy [facility] for tasks assessed/performed                  Past Medical History:  Diagnosis Date   Hashimoto's thyroiditis    Heart palpitations    Hyperlipidemia    x years   Hypertension    x years   Shingles    Squamous carcinoma    L arm    TIA (transient ischemic attack)    Past Surgical History:  Procedure Laterality Date   CHOLECYSTECTOMY     Patient Active Problem List   Diagnosis Date Noted   Acute deep vein thrombosis (DVT) of right peroneal vein (HCC) 08/24/2022   Overweight 02/13/2019   Educated about COVID-19 virus infection 02/13/2019   Family history of sudden death 2018/01/16   Palpitations 01/16/18   Gastroesophageal reflux disease without esophagitis 02/18/2017   Muscle tension dysphonia 02/18/2017   Tongue lesion 02/18/2017   TIA (transient ischemic attack) 09/06/2011   Hypertension 05/06/2010   BPPV (benign paroxysmal positional vertigo) 05/06/2010   Hyperlipemia 05/06/2010   Hashimoto's disease 05/06/2010   Hypothyroid 05/06/2010   Vasomotor flushing 05/06/2010   OBESITY, UNSPECIFIED 09/17/2009   PREMATURE VENTRICULAR CONTRACTIONS 09/17/2009   DYSPNEA 09/17/2009    PCP: Margaree Mackintosh, MD  REFERRING PROVIDER: Cristie Hem, PA-C   REFERRING DIAG: Closed right ankle fracture, initial encounter   THERAPY DIAG:  Pain in right ankle and joints of right foot  Stiffness of right ankle, not elsewhere classified  Localized edema  Rationale for Evaluation and Treatment:  Rehabilitation  ONSET DATE: 08/15/22  SUBJECTIVE:   SUBJECTIVE STATEMENT: Patient reports that she feels good today. She still has some soreness with heel to toe walking, but it's not bad.   PERTINENT HISTORY: Hypertension, history of TIA, and DVT PAIN:  Are you having pain? Yes: NPRS scale: 0/10 Pain location: right ankle Pain description: intermittent stabbing pain  Aggravating factors: a lot of pressure on her foot Relieving factors: elevating her foot  PRECAUTIONS: Other: DVT  RED FLAGS: None   WEIGHT BEARING RESTRICTIONS: No  FALLS:  Has patient fallen in last 6 months? Yes. Number of falls 1  LIVING ENVIRONMENT: Lives with: lives with their spouse Lives in: House/apartment Stairs: Yes: Internal: 1 flight steps; can reach both and External: 3 steps; can reach both Has following equipment at home: None  OCCUPATION: office manager  PLOF: Independent  PATIENT GOALS: be able to garden, walk better, and back to her daily activities  NEXT MD VISIT: 10/19/22  OBJECTIVE:   DIAGNOSTIC FINDINGS: 09/14/22 right ankle x-ray  X-rays demonstrate considerable consolidation of the fracture site   COGNITION: Overall cognitive status: Within functional limits for tasks assessed     SENSATION: Patient reports diminished sensation in the 4th and 5th toes of her right foot.   EDEMA:  Figure 8: R: 57.5 cm L: 55 cm   PALPATION: TTP: right anterior tibialis and distal tibia   JOINT MOBILITY:  Right subtalar: [facility] and nonpainful   Right midfoot: WFL and nonpainful   Right distal fibular: WFL and nonpainful  Right talocrural: WFL and painful   LOWER EXTREMITY ROM:  Active ROM Right eval Right 10/14/22 Left eval  Hip flexion     Hip extension     Hip abduction     Hip adduction     Hip internal rotation     Hip external rotation     Knee flexion     Knee extension     Ankle dorsiflexion 3; tight  7 3  Ankle plantarflexion 28; slight pain and tight  34 58  Ankle  inversion 30; painful  32 27  Ankle eversion 13; increased pain  15 13   (Blank rows = not tested)  LOWER EXTREMITY MMT:   MMT Right 10/14/22 Left 10/14/22  Hip flexion    Hip extension    Hip abduction    Hip adduction    Hip internal rotation    Hip external rotation    Knee flexion    Knee extension    Ankle dorsiflexion 4+/5 4+/5  Ankle plantarflexion    Ankle inversion 4+/5 4+/5  Ankle eversion 4/5 4-/5   (Blank rows = not tested)  GAIT: Assistive device utilized:  Right CAM boot Level of assistance: Modified independence Comments: decreased gait speed and stride length secondary to CAM boot    TODAY'S TREATMENT:                                                                                                                              DATE:                                     10/14/22 EXERCISE LOG  Exercise Repetitions and Resistance Comments  Nustep  L6 x 17 minutes   Gastroc stretch  2 minutes   Soleus stretch  3 x 30 seconds   Tandem walking on foam  4 laps   BOSU circles 2 minutes         Blank cell = exercise not performed today                                    10/12/22 EXERCISE LOG  Exercise Repetitions and Resistance Comments  Nustep  L6 x 15 minutes   Rocker board  2 minutes   Stepping strategy 30 reps    Great toe stretch  10 reps w/ 15 second hold  Added to HEP   Self toe extension stretch   Added to HEP   Self STM with tennis ball  Added to HEP   Tandem stance on foam  4  x 30 seconds each  Intermittent UE support  Marching on foam  2.5 minutes    Blank cell =  exercise not performed today                                    10/07/22 EXERCISE LOG  Exercise Repetitions and Resistance Comments  Nustep  L6 x 12 minutes    Rocker board  4 minutes each  DF/PF and inversion/eversion; For ankle mobility   Step up onto BOSU 2 minutes  BUE support  Static stance on foam  2 minutes Narrow BOS on BOSU (ball up); intermittent UE support  Standing toe raises   2 minutes     Blank cell = exercise not performed today  Manual Therapy Soft Tissue Mobilization: fibularis longus, for improved soft tissue extensibility Joint Mobilizations: distal fibular, grade I-III Passive ROM: all planes, to tolerance    PATIENT EDUCATION:  Education details: healing, prognosis, and HEP Person educated: Patient Education method: Explanation Education comprehension: verbalized understanding  HOME EXERCISE PROGRAM:   ASSESSMENT:  CLINICAL IMPRESSION: Patient is making excellent progress with skilled physical therapy as evidenced by her subjective reports, objective measures, functional mobility, and progress toward her goals. She was able to meet most of her goals for skilled physical therapy. However, she continues to experience decreased right ankle plantarflexion compared to the left ankle. She was demonstrate right ankle motion equal to or greater than her left ankle in all other planes of motion. Her HEP was reviewed and she reported feeling comfortable with these interventions. She will be evaluated for a potential discharge with an updated HEP at her next appointment.    OBJECTIVE IMPAIRMENTS: Abnormal gait, decreased activity tolerance, decreased balance, decreased mobility, difficulty walking, decreased ROM, decreased strength, increased edema, impaired sensation, impaired tone, and pain.   ACTIVITY LIMITATIONS: carrying, lifting, standing, squatting, stairs, and locomotion level  PARTICIPATION LIMITATIONS: meal prep, cleaning, laundry, shopping, and community activity  PERSONAL FACTORS: Time since onset of injury/illness/exacerbation and 3+ comorbidities: Hypertension, history of TIA, and DVT  are also affecting patient's functional outcome.   REHAB POTENTIAL: Good  CLINICAL DECISION MAKING: Evolving/moderate complexity  EVALUATION COMPLEXITY: Moderate   GOALS: Goals reviewed with patient? Yes  LONG TERM GOALS: Target date: 10/21/22  Patient  will be independent with her HEP.  Baseline:  Goal status: MET  2.  Patient will be able to ambulate with no significant gait deviations.  Baseline:  Goal status: MET  3.  Patient will be able to demonstrate proper lifting mechanics for improved function gardening.  Baseline:  Goal status: MET  4.  Patient will improve he right ankle plantarflexion to at least 38 degrees for improved ankle mobility.  Baseline:  Goal status: NEARLY MET  PLAN:  PT FREQUENCY: 2x/week  PT DURATION: 4 weeks  PLANNED INTERVENTIONS: Therapeutic exercises, Therapeutic activity, Neuromuscular re-education, Balance training, Gait training, Patient/Family education, Self Care, Joint mobilization, Stair training, Electrical stimulation, Cryotherapy, Moist heat, Manual therapy, and Re-evaluation  PLAN FOR NEXT SESSION: Nustep, ankle AROM, progressing out of CAM boot into brace   Granville Lewis, PT 10/14/2022, 4:29 PM

## 2022-10-19 ENCOUNTER — Ambulatory Visit: Payer: Medicare HMO | Admitting: Orthopaedic Surgery

## 2022-10-19 ENCOUNTER — Other Ambulatory Visit (INDEPENDENT_AMBULATORY_CARE_PROVIDER_SITE_OTHER): Payer: Medicare HMO

## 2022-10-19 DIAGNOSIS — S82891A Other fracture of right lower leg, initial encounter for closed fracture: Secondary | ICD-10-CM

## 2022-10-19 NOTE — Progress Notes (Signed)
Office Visit Note   Patient: Katie Allen           Date of Birth: Sep 17, 1950           MRN: [id number] Visit Date: 10/19/2022              Requested by: Margaree Mackintosh, MD [address] [place],  [place] [address] PCP: Margaree Mackintosh, MD   Assessment & Plan: Visit Diagnoses:  1. Closed right ankle fracture, initial encounter     Plan: Patient is now a little over 2 months status post minimally displaced right distal fibula fracture.  She has felt much better recently.  At this point she can discontinue physical therapy and increase activity as tolerated.  I explained that she may have some persistent swelling for several more months.  From my standpoint she can follow-up as needed.  Follow-Up Instructions: No follow-ups on file.   Orders:  Orders Placed This Encounter  Procedures   XR Ankle Complete Right   No orders of the defined types were placed in this encounter.     Procedures: No procedures performed   Clinical Data: No additional findings.   Subjective: Chief Complaint  Patient presents with   Right Ankle - Follow-up    HPI Katie Allen follows up today for right distal fibula fracture.  Date of injury was 08/15/2022.  She is about 2 months status post the injury.  She has occasional pain but overall she is doing well.  She has been progressing well at physical therapy. Review of Systems   Objective: Vital Signs: There were no vitals taken for this visit.  Physical Exam  Ortho Exam Examination of the right ankle shows mild swelling to the lateral aspect.  No tenderness to palpation.  Good range of motion. Specialty Comments:  No specialty comments available.  Imaging: XR Ankle Complete Right  Result Date: 10/19/2022 X-rays of the right ankle show healed distal fibula fracture and consolidation.    PMFS History: Patient Active Problem List   Diagnosis Date Noted   Acute deep vein thrombosis (DVT) of right peroneal vein (HCC)  08/24/2022   Overweight 02/13/2019   Educated about COVID-19 virus infection 02/13/2019   Family history of sudden death Jan 05, 2018   Palpitations 2018/01/05   Gastroesophageal reflux disease without esophagitis 02/18/2017   Muscle tension dysphonia 02/18/2017   Tongue lesion 02/18/2017   TIA (transient ischemic attack) 09/06/2011   Hypertension 05/06/2010   BPPV (benign paroxysmal positional vertigo) 05/06/2010   Hyperlipemia 05/06/2010   Hashimoto's disease 05/06/2010   Hypothyroid 05/06/2010   Vasomotor flushing 05/06/2010   OBESITY, UNSPECIFIED 09/17/2009   PREMATURE VENTRICULAR CONTRACTIONS 09/17/2009   DYSPNEA 09/17/2009   Past Medical History:  Diagnosis Date   Hashimoto's thyroiditis    Heart palpitations    Hyperlipidemia    x years   Hypertension    x years   Shingles    Squamous carcinoma    L arm    TIA (transient ischemic attack)     Family History  Problem Relation Age of Onset   Colon cancer Mother    Heart attack Father 60       CABG in his 105s   Cardiomyopathy Brother 5   Stroke Other     Past Surgical History:  Procedure Laterality Date   CHOLECYSTECTOMY     Social History   Occupational History    Employer: [employer]    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

## 2022-10-26 ENCOUNTER — Other Ambulatory Visit: Payer: Self-pay

## 2022-10-26 DIAGNOSIS — Z13820 Encounter for screening for osteoporosis: Secondary | ICD-10-CM

## 2022-10-28 NOTE — Progress Notes (Addendum)
Patient Care Team: Margaree Mackintosh, MD as PCP - General (Internal Medicine)  Visit Date: 11/04/22  Subjective:    Patient ID: Katie Allen , Female   DOB: [birth year], 72 y.o.    MRN: [id number]   72 y.o. Female presents today for a 6 month follow-up. History of hyperlipidemia , hypertension, shingles, squamous carcinoma left arm, transient ischemic attack, palpitations, Hashimoto's thyroiditis with resultant hypothyroidism.  History of hyperlipidemia treated with atorvastatin 80 mg daily. HDL low at 46. Liver functions normal.   History of hypertension treated with metoprolol tartrate 37.5 mg twice daily. She has been taking one 25 mg tablet daily due to low blood pressures at home. Blood pressure normal today at 110/70.   Seen in ED on 08/15/22 for minimally displaced right distal fibula fracture. Has done PT for this. Ankle fracture provoked DVT and started on Eliquis. She has had increased stress as a result of these issues.   History of Hashimoto's thyroiditis treated with Synthroid 25 mcg every other day and 50 mg on alternating days. TSH normal at 1.37 on 04/22/22.   History of GE reflux treated with omeprazole.   History of glucose intolerance. HGBA1c at 6.1% on 10/29/22, no change from six months ago. She has lost 2 pounds since 04/29/22.   History of squamous cell carcinoma of skin.    In February 2019 was screened for abdominal aortic aneurysm due to family history but no aneurysm was present.    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 Diagnosed with otosclerosis of right ear. Seen by [facility]. History of mild tinnitus.    History of PVCs and bigeminy. She had a treadmill test in 2015 which was stopped due to dyspnea showing PVCs during exercise and recovery but no sustained runs of PVCs. History of palpitations seen by Dr. Antoine Poche with next follow-up on 12/06/22.  History of occasional PVCs in the  past.  She saw Dr. Antoine Poche August 15, 2020 after a bout of palpitations for about 3 days. EKG was normal at that time.  He recommended a Kardia device.  Echocardiogram January 2022 was normal. History of mild carotid stenosis 1 to 39% followed by Dr. Antoine Poche. Takes Klor-Con 10 meq daily. 11/11/21 EKG showed normal sinus rhythm, rate 59, axis within normal limits, intervals within normal limits, no acute ST-T wave changes.    She had a TIA in July 2013 on estrogen replacement. She complained of numbness in arm and leg for 2 weeks. MRI was unremarkable except for small vessel disease. She is maintained on Plavix.   06/03/22 CT chest showed 4 mm right middle lobe pulmonary nodule, coronary calcium score at 234. She would like to do follow-up imaging next year.  Reports her colonoscopy and mammogram are scheduled later this year.  Past Medical History:  Diagnosis Date  . Hashimoto's thyroiditis   . Heart palpitations   . Hyperlipidemia    x years  . Hypertension    x years  . Shingles   . Squamous carcinoma    L arm   . TIA (transient ischemic attack)      Family History  Problem Relation Age of Onset  . Colon cancer Mother   . Heart attack Father 76       CABG in his 42s  . Cardiomyopathy Brother 59  . Stroke Other     Social History   Social History Narrative   Works as business  Production designer, theatre/television/film of a Administrator, arts. Has a 73 year old son.   Patient lives at home with spouse.   Caffeine Use: 4 cups daily      Review of Systems  Constitutional:  Negative for fever and malaise/fatigue.  HENT:  Negative for congestion.   Eyes:  Negative for blurred vision.  Respiratory:  Negative for cough and shortness of breath.   Cardiovascular:  Negative for chest pain, palpitations and leg swelling.  Gastrointestinal:  Negative for vomiting.  Musculoskeletal:  Negative for back pain.  Skin:  Negative for rash.  Neurological:  Negative for loss of consciousness and headaches.         Objective:   Vitals: BP 110/70   Pulse 65   Ht 5\' 11"  (1.803 m)   Wt 232 lb (105.2 kg)   SpO2 95%   BMI 32.36 kg/m    Physical Exam Vitals and nursing note reviewed.  Constitutional:      General: She is not in acute distress.    Appearance: Normal appearance. She is not toxic-appearing.  HENT:     Head: Normocephalic and atraumatic.  Cardiovascular:     Rate and Rhythm: Normal rate and regular rhythm. No extrasystoles are present.    Pulses: Normal pulses.     Heart sounds: Normal heart sounds. No murmur heard.    No friction rub. No gallop.  Pulmonary:     Effort: Pulmonary effort is normal. No respiratory distress.     Breath sounds: Normal breath sounds. No wheezing or rales.  Skin:    General: Skin is warm and dry.  Neurological:     Mental Status: She is alert and oriented to person, place, and time. Mental status is at baseline.  Psychiatric:        Mood and Affect: Mood normal.        Behavior: Behavior normal.        Thought Content: Thought content normal.        Judgment: Judgment normal.      Results:   Studies obtained and personally reviewed by me:  06/03/22 CT chest showed 4 mm right middle lobe pulmonary nodule, coronary calcium score at 234. She would like to do follow-up imaging next year.  11/11/21 EKG showed normal sinus rhythm, rate 59, axis within normal limits, intervals within normal limits, no acute ST-T wave changes.   Labs:       Component Value Date/Time   NA 144 04/22/2022 0912   NA 142 03/09/2013 1017   K 4.3 04/22/2022 0912   CL 107 04/22/2022 0912   CO2 30 04/22/2022 0912   GLUCOSE 94 04/22/2022 0912   BUN 14 04/22/2022 0912   BUN 17 03/09/2013 1017   CREATININE 0.59 (L) 04/22/2022 0912   CALCIUM 10.2 04/22/2022 0912   PROT 6.4 10/29/2022 0915   PROT 6.8 03/09/2013 1017   ALBUMIN 4.1 06/07/2016 0950   ALBUMIN 4.6 03/09/2013 1017   AST 14 10/29/2022 0915   ALT 18 10/29/2022 0915   ALKPHOS 73 06/07/2016 0950   BILITOT  0.7 10/29/2022 0915   GFRNONAA 90 04/11/2020 0929   GFRAA 105 04/11/2020 0929     Lab Results  Component Value Date   WBC 4.4 05/28/2022   HGB 14.2 05/28/2022   HCT 42.7 05/28/2022   MCV 86.3 05/28/2022   PLT 178 05/28/2022    Lab Results  Component Value Date   CHOL 115 10/29/2022   HDL 46 (L) 10/29/2022   LDLCALC 48 10/29/2022  TRIG 125 10/29/2022   CHOLHDL 2.5 10/29/2022    Lab Results  Component Value Date   HGBA1C 6.1 (H) 10/29/2022     Lab Results  Component Value Date   TSH 1.37 04/22/2022      Assessment & Plan:   Hx Hashimoto's thyroiditis with resultant hypothyroidism currently treated with Synthroid 25 mcg every other day and 50 mg on alternating days. TSH normal at 1.37 on 04/22/22.   GE reflux: stable with omeprazole.  Chronic anticoagulation with Plavix  Closed right ankle fracture (distal fib) July 2024 after a fall. Recovering nicely   Glucose intolerance: HGBA1c at 6.1% on 10/29/22, no change from six months ago. She has lost 2 pounds since 04/29/22.  Palpitations: seen by Dr. Antoine Poche with next follow-up on 12/06/22.  Mixed conductive and sensorineural hearing loss right ear with restricted hearing of left ear: seen by [facility].   06/03/22 CT chest showed 4 mm right middle lobe pulmonary nodule, coronary calcium score at 234. She would like to do follow-up imaging next year.   Reports her colonoscopy and mammogram are scheduled later this year.  Vaccine counseling: declined tetanus vaccine. She will go to the pharmacy for the shingles vaccine.  Return in 6 months for health maintenance exam or as needed.    I,Alexander Ruley,acting as a Neurosurgeon for Margaree Mackintosh, MD.,have documented all relevant documentation on the behalf of Margaree Mackintosh, MD,as directed by  Margaree Mackintosh, MD while in the presence of Margaree Mackintosh, MD.   I, Margaree Mackintosh, MD, have reviewed all documentation for this visit. The documentation on 11/04/22 for the exam,  diagnosis, procedures, and orders are all accurate and complete.

## 2022-10-29 ENCOUNTER — Other Ambulatory Visit: Payer: Medicare HMO

## 2022-10-29 DIAGNOSIS — E785 Hyperlipidemia, unspecified: Secondary | ICD-10-CM | POA: Diagnosis not present

## 2022-10-29 DIAGNOSIS — E1169 Type 2 diabetes mellitus with other specified complication: Secondary | ICD-10-CM

## 2022-10-29 DIAGNOSIS — E119 Type 2 diabetes mellitus without complications: Secondary | ICD-10-CM

## 2022-10-30 ENCOUNTER — Other Ambulatory Visit: Payer: Self-pay | Admitting: Internal Medicine

## 2022-10-30 DIAGNOSIS — E063 Autoimmune thyroiditis: Secondary | ICD-10-CM

## 2022-10-30 LAB — HEPATIC FUNCTION PANEL
AG Ratio: 1.9 (calc) (ref 1.0–2.5)
ALT: 18 U/L (ref 6–29)
AST: 14 U/L (ref 10–35)
Albumin: 4.2 g/dL (ref 3.6–5.1)
Alkaline phosphatase (APISO): 80 U/L (ref 37–153)
Bilirubin, Direct: 0.2 mg/dL (ref 0.0–0.2)
Globulin: 2.2 g/dL (calc) (ref 1.9–3.7)
Indirect Bilirubin: 0.5 mg/dL (calc) (ref 0.2–1.2)
Total Bilirubin: 0.7 mg/dL (ref 0.2–1.2)
Total Protein: 6.4 g/dL (ref 6.1–8.1)

## 2022-10-30 LAB — LIPID PANEL
Cholesterol: 115 mg/dL (ref <200)
HDL: 46 mg/dL — ABNORMAL LOW (ref >=50)
LDL Cholesterol (Calc): 48 mg/dL (calc)
Non-HDL Cholesterol (Calc): 69 mg/dL (calc) (ref <130)
Total CHOL/HDL Ratio: 2.5 (calc) (ref <5.0)
Triglycerides: 125 mg/dL (ref <150)

## 2022-10-30 LAB — HEMOGLOBIN A1C
Hgb A1c MFr Bld: 6.1 % of total Hgb — ABNORMAL HIGH (ref <5.7)
Mean Plasma Glucose: 128 mg/dL
eAG (mmol/L): 7.1 mmol/L

## 2022-11-04 ENCOUNTER — Ambulatory Visit (INDEPENDENT_AMBULATORY_CARE_PROVIDER_SITE_OTHER): Payer: Medicare HMO | Admitting: Internal Medicine

## 2022-11-04 ENCOUNTER — Encounter: Payer: Self-pay | Admitting: Internal Medicine

## 2022-11-04 VITALS — BP 110/70 | HR 65 | Ht 71.0 in | Wt 232.0 lb

## 2022-11-04 DIAGNOSIS — E063 Autoimmune thyroiditis: Secondary | ICD-10-CM | POA: Diagnosis not present

## 2022-11-04 DIAGNOSIS — E782 Mixed hyperlipidemia: Secondary | ICD-10-CM

## 2022-11-04 DIAGNOSIS — E785 Hyperlipidemia, unspecified: Secondary | ICD-10-CM

## 2022-11-04 DIAGNOSIS — R911 Solitary pulmonary nodule: Secondary | ICD-10-CM

## 2022-11-04 DIAGNOSIS — Z8673 Personal history of transient ischemic attack (TIA), and cerebral infarction without residual deficits: Secondary | ICD-10-CM

## 2022-11-04 DIAGNOSIS — Z7901 Long term (current) use of anticoagulants: Secondary | ICD-10-CM | POA: Diagnosis not present

## 2022-11-04 DIAGNOSIS — Z87898 Personal history of other specified conditions: Secondary | ICD-10-CM

## 2022-11-04 DIAGNOSIS — I10 Essential (primary) hypertension: Secondary | ICD-10-CM

## 2022-11-04 DIAGNOSIS — E1169 Type 2 diabetes mellitus with other specified complication: Secondary | ICD-10-CM | POA: Diagnosis not present

## 2022-11-04 DIAGNOSIS — E038 Other specified hypothyroidism: Secondary | ICD-10-CM

## 2022-11-04 DIAGNOSIS — Z8719 Personal history of other diseases of the digestive system: Secondary | ICD-10-CM

## 2022-11-04 NOTE — Patient Instructions (Addendum)
Vaccines discussed.Labs are stable. Recovering from ankle fracture in July.  Continue current medications and follow-up in 6 months for health maintenance exam and Medicare wellness visit.  It was a pleasure to see you today.

## 2022-11-08 ENCOUNTER — Encounter: Payer: Self-pay | Admitting: Internal Medicine

## 2022-11-08 NOTE — Telephone Encounter (Signed)
Called patient and ask her to do a home COVID test and call me back with results, she does not have a test at home, so she is going to get someone to pick one up for her then call me back. It may be in the morning before she calls me back.

## 2022-11-09 ENCOUNTER — Encounter: Payer: Self-pay | Admitting: Internal Medicine

## 2022-11-09 ENCOUNTER — Telehealth: Payer: Medicare HMO | Admitting: Internal Medicine

## 2022-11-09 VITALS — BP 117/64 | HR 71

## 2022-11-09 DIAGNOSIS — U071 COVID-19: Secondary | ICD-10-CM

## 2022-11-09 DIAGNOSIS — Z7901 Long term (current) use of anticoagulants: Secondary | ICD-10-CM

## 2022-11-09 DIAGNOSIS — E1169 Type 2 diabetes mellitus with other specified complication: Secondary | ICD-10-CM

## 2022-11-09 DIAGNOSIS — E039 Hypothyroidism, unspecified: Secondary | ICD-10-CM

## 2022-11-09 DIAGNOSIS — I10 Essential (primary) hypertension: Secondary | ICD-10-CM

## 2022-11-09 DIAGNOSIS — E782 Mixed hyperlipidemia: Secondary | ICD-10-CM

## 2022-11-09 DIAGNOSIS — Z8673 Personal history of transient ischemic attack (TIA), and cerebral infarction without residual deficits: Secondary | ICD-10-CM

## 2022-11-09 DIAGNOSIS — Z87898 Personal history of other specified conditions: Secondary | ICD-10-CM

## 2022-11-09 MED ORDER — AZITHROMYCIN 250 MG PO TABS: ORAL_TABLET | ORAL | 0 refills | Status: AC

## 2022-11-09 NOTE — Telephone Encounter (Signed)
+  COVID, scheduled for Video visit at 11:30, will have vitals prior to visit.

## 2022-11-09 NOTE — Patient Instructions (Signed)
Patient prefers not to take Paxlovid.  Given her medical issues, I think she should take Zithromax Z-PAK 2 tabs day 1 followed by 1 tab days 2 through 5.  Rest and stay well-hydrated.  Monitor pulse oximetry at home and call if develop significant shortness of breath or pulse oximetry less than 92%.  Suggest quarantining for 5 days.  Call if not improving or has any questions or concerns.  We are sorry you are ill and hope you feel better soon.

## 2022-11-09 NOTE — Progress Notes (Signed)
Patient Care Team: Margaree Mackintosh, MD as PCP - General (Internal Medicine)  I connected with Katie Allen on 11/09/22 at 11:42 AM by video enabled telemedicine visit and verified that I am speaking with the correct person using two identifiers.   I discussed the limitations, risks, security and privacy concerns of performing an evaluation and management service by telemedicine and the availability of in-person appointments. I also discussed with the patient that there may be a patient responsible charge related to this service. The patient expressed understanding and agreed to proceed.   Other persons participating in the visit and their role in the encounter: Medical scribe, Doylene Bode  Patient's location: Home  Provider's location: Clinic   I provided 10 minutes of face-to-face video visit time during this encounter, and > 50% was spent counseling as documented under my assessment & plan. She is identified using two identifiers, Taylah Dubiel, a patient of this practice. She is in her house and I am in my practice. She is agreeable to using this format today.  Chief Complaint: cough, fever   Subjective:    Patient ID: Katie Allen , Female    DOB: August 03, 1950, 72 y.o.    MRN: [id number]   72 y.o. Female presents today for cough, fever of 100.6 F, nausea, vomiting, diarrhea since 11/06/22. Denies sputum production. Reports positive at-home Covid-19 test. Her appetite is decreased.  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 Has a 71 year old son.   Patient lives at home with spouse.   Caffeine Use: 4 cups daily      Review of Systems  Constitutional:  Positive for fever. Negative for malaise/fatigue.  HENT:  Negative for congestion.   Eyes:  Negative for blurred vision and redness.  Respiratory:  Positive for cough. Negative for shortness of breath.   Cardiovascular:  Negative for chest pain, palpitations and leg swelling.  Gastrointestinal:  Positive for diarrhea, nausea and vomiting.  Musculoskeletal:  Negative for back pain.  Skin:  Negative for rash.  Neurological:  Negative for loss of consciousness and headaches.        Objective:   Vitals: BP 117/64   Pulse 71   SpO2 98%    Physical Exam Vitals and nursing note reviewed.  Constitutional:      General: She is not in acute distress.    Appearance: Normal appearance. She is not toxic-appearing.  HENT:     Head: Normocephalic and atraumatic.  Pulmonary:     Effort: Pulmonary effort is normal.  Skin:    General: Skin is warm and dry.  Neurological:     Mental Status: She is alert and oriented to person, place, and time. Mental status is at baseline.  Psychiatric:        Mood and Affect: Mood normal.        Behavior:  Behavior normal.        Thought Content: Thought content normal.        Judgment: Judgment normal.       Results:   Studies obtained and personally reviewed by me:   Labs:       Component Value Date/Time   NA 144 04/22/2022 0912   NA 142 03/09/2013 1017   K 4.3 04/22/2022 0912   CL 107 04/22/2022 0912   CO2 30 04/22/2022 0912   GLUCOSE 94 04/22/2022 0912   BUN 14 04/22/2022 0912   BUN 17 03/09/2013 1017   CREATININE 0.59 (L) 04/22/2022 0912   CALCIUM 10.2 04/22/2022 0912   PROT 6.4 10/29/2022 0915   PROT 6.8 03/09/2013 1017   ALBUMIN 4.1 06/07/2016 0950   ALBUMIN 4.6 03/09/2013 1017   AST 14 10/29/2022 0915   ALT 18 10/29/2022 0915   ALKPHOS 73 06/07/2016 0950   BILITOT 0.7 10/29/2022  0915   GFRNONAA 90 04/11/2020 0929   GFRAA 105 04/11/2020 0929     Lab Results  Component Value Date   WBC 4.4 05/28/2022   HGB 14.2 05/28/2022   HCT 42.7 05/28/2022   MCV 86.3 05/28/2022   PLT 178 05/28/2022    Lab Results  Component Value Date   CHOL 115 10/29/2022   HDL 46 (L) 10/29/2022   LDLCALC 48 10/29/2022   TRIG 125 10/29/2022   CHOLHDL 2.5 10/29/2022    Lab Results  Component Value Date   HGBA1C 6.1 (H) 10/29/2022     Lab Results  Component Value Date   TSH 1.37 04/22/2022      Assessment & Plan:   Acute Covid-19 infection:  Patient was offered Paxlovid but she declines this option.  I have suggested instead and prescribed Zithromax Z-Pak to take two tabs day 1 followed by one tab days 2-5. Advised to quarantine for 5 days. Get plenty of rest, stay well-hydrated and walk regularly. Contact us if you start having new or worsening symptoms.May want to check oxygen level with home pulse oximeter.    I,Alexander Ruley,acting as a Neurosurgeon for Margaree Mackintosh, MD.,have documented all relevant documentation on the behalf of Margaree Mackintosh, MD,as directed by  Margaree Mackintosh, MD while in the presence of Margaree Mackintosh, MD.   I, Margaree Mackintosh, MD, have reviewed all documentation for this visit. The documentation on 11/09/22 for the exam, diagnosis, procedures, and orders are all accurate and complete.

## 2022-11-18 ENCOUNTER — Ambulatory Visit (HOSPITAL_COMMUNITY)
Admission: RE | Admit: 2022-11-18 | Discharge: 2022-11-18 | Disposition: A | Discharge status: Home / Self Care | Payer: Medicare HMO | Source: Ambulatory Visit | Attending: Vascular Surgery | Admitting: Vascular Surgery

## 2022-11-18 ENCOUNTER — Encounter (HOSPITAL_COMMUNITY): Payer: Self-pay

## 2022-11-18 VITALS — BP 128/72 | HR 60

## 2022-11-18 DIAGNOSIS — I82451 Acute embolism and thrombosis of right peroneal vein: Secondary | ICD-10-CM | POA: Insufficient documentation

## 2022-11-18 NOTE — Patient Instructions (Signed)
You have been discharged from the DVT Clinic! No further follow up in the DVT Clinic is needed.  -Finish your current supply of Eliquis, then discontinue as you will have completed 3 months of treatment.  -Restart Plavix.   Please reach out if any questions come up. [phone] [facility].

## 2022-11-18 NOTE — Progress Notes (Signed)
DVT Clinic Note  Name: Katie Allen     MRN: [id number]     DOB: 1950-06-22     Sex: female  PCP: Margaree Mackintosh, MD  Today's Visit: Visit Information: Discharge Visit  Referred to DVT Clinic by: Orthopedic Surgery - Jari Sportsman, PA-C  Referred to CPP by: Dr. Lenell Antu Reason for referral:  Chief Complaint  Patient presents with   Med Management - DVT   HISTORY OF PRESENT ILLNESS: Katie Allen is a 72 y.o. female with PMH HTN, HLD, TIA in 2013 while on estrogen replacement now maintained on Plavix, palpitations, hypothyroidism, obesity, who presents for follow up medication management for DVT. Patient initially presented to the ED 08/15/22 after a mechanical fall down her porch steps with injury to the right ankle. X-rays demonstrated an oblique fracture to the right ankle in addition to left ankle sprain. She was placed in a walking boot for the right ankle and was nonweightbearing. She wasn't moving much outside of getting up to go to the kitchen or bathroom. She was seen at [facility] 08/24/22 for follow up and was continuing to have RLE pain and swelling so an [place] was ordered to rule out DVT. This showed acute DVT involving the right peroneal veins. No prior history of VTE. She is up to date on cancer screenings. Last seen in DVT Clinic 08/24/22 at which time Eliquis was started with plans to treat provoked DVT for 3 months.   Today patient reports that her ankle fracture is healed. She has completed physical therapy and has been cleared to return to her normal activities. Denies abnormal bleeding or bruising. Denies missed doses of Eliquis. Reports she was consistently wearing compression stockings after DVT diagnosis but now only wears them as needed as her calf swelling has resolved. Remains to have some mild edema around the ankle. She has been holding her Plavix while on Eliquis.   Positive Thrombotic Risk Factors: Recent trauma (within 3 months), Recent admission to hospital with  acute illness (within 3 months), Bed rest >72 hours within 3 month, Older Age Bleeding Risk Factors: Age >65 years  Negative Thrombotic Risk Factors: Previous VTE, Recent surgery (within 3 months), Paralysis, paresis, or recent plaster cast immobilization of lower extremity, Central venous catheterization, Sedentary journey lasting >8 hours within 4 weeks, Pregnancy, Within 6 weeks postpartum, Recent cesarean section (within 3 months), Estrogen therapy, Testosterone therapy, Erythropoiesis-stimulating agent, Recent COVID diagnosis (within 3 months), Active cancer, Non-malignant, chronic inflammatory condition, Known thrombophilic condition, Smoking, Obesity  Rx Insurance Coverage: Medicare Rx Affordability: Eliquis starter pack [facility] last visit for $0 using one time free card. Refills will be $47/month which is affordable per the patient.  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 Has a 13 year old son.   Patient lives at home with spouse.   Caffeine Use: 4 cups daily   Social Determinants of  Health   Financial Resource Strain: Low Risk  (11/03/2022)   Overall Financial Resource Strain (CARDIA)    Difficulty of Paying Living Expenses: Not hard at all  Food Insecurity: No Food Insecurity (11/03/2022)   Hunger Vital Sign    Worried About Running Out of Food in the Last Year: Never true    Ran Out of Food in the Last Year: Never true  Transportation Needs: No Transportation Needs (11/03/2022)   PRAPARE - Administrator, Civil Service (Medical): No    Lack of Transportation (Non-Medical): No  Physical Activity: Inactive (11/03/2022)   Exercise Vital Sign    Days of Exercise per Week: 0 days    Minutes of Exercise per Session: 0 min  Stress: No Stress Concern Present (11/03/2022)   [employer] of Occupational Health - Occupational Stress Questionnaire    Feeling of Stress : Not at all  Social Connections: Unknown (11/03/2022)   Social Connection and Isolation Panel [NHANES]    Frequency of Communication with Friends and Family: Twice a week    Frequency of Social Gatherings with Friends and Family: Once a week    Attends Religious Services: Patient declined    Database administrator or Organizations: No    Attends Banker Meetings: Never    Marital Status: Married  Catering manager Violence: Not on file    Family History  Problem Relation Age of Onset   Colon cancer Mother    Heart attack Father 2       CABG in his 49s   Cardiomyopathy Brother 52   Stroke Other     Allergies as of 11/18/2022 - Review Complete 11/18/2022  Allergen Reaction Noted   Penicillins Swelling    Codeine Nausea Only 08/24/2022    Current Outpatient Medications on File Prior to Encounter  Medication Sig Dispense Refill   apixaban (ELIQUIS) 5 MG TABS tablet Take 1 tablet (5 mg total) by mouth 2 (two) times daily. Start taking after completion of starter pack. 60 tablet 1   Ascorbic Acid (VITAMIN C) 500 MG tablet Take 500 mg by mouth daily.     atorvastatin (LIPITOR)  80 MG tablet TAKE 1 TABLET BY MOUTH EVERY DAY 90 tablet 3   b complex vitamins capsule Take 1 capsule by mouth daily.     Cholecalciferol 50 MCG (2000 UT) CAPS Take by mouth.     DENTAGEL 1.1 % GEL dental gel      levothyroxine (SYNTHROID) 50 MCG tablet TAKE 1/2 TABLET BY MOUTH EVERY OTHER DAY, AND 1 TABLET ON ALTERNATING DAYS 70 tablet 1   lisinopril-hydrochlorothiazide (ZESTORETIC) 20-25 MG tablet TAKE 1 TABLET BY MOUTH EVERY DAY 90 tablet 1   metoprolol tartrate (LOPRESSOR) 25 MG tablet TAKE 1.5 TABLETS (37.5 MG TOTAL) BY MOUTH TWICE A DAY (Patient taking differently: Take 25 mg by mouth 2 (two) times daily. TAKE 1.5 TABLETS (37.5 MG TOTAL) BY MOUTH TWICE A DAY) 270 tablet 3   Multiple Vitamin (  MULTIVITAMIN) tablet Take 1 tablet by mouth daily.     potassium chloride (KLOR-CON) 10 MEQ tablet TAKE 1 TABLET BY MOUTH EVERY DAY 90 tablet 3   clopidogrel (PLAVIX) 75 MG tablet TAKE 1 TABLET BY MOUTH WITH BREAKFAST (Patient not taking: Reported on 11/18/2022) 90 tablet 3   No current facility-administered medications on file prior to encounter.   REVIEW OF SYSTEMS:  Review of Systems  Respiratory:  Negative for shortness of breath.   Cardiovascular:  Positive for leg swelling (ankle). Negative for chest pain and palpitations.  Musculoskeletal:  Negative for myalgias.  Neurological:  Negative for dizziness and tingling.   PHYSICAL EXAMINATION:  Vitals:   11/18/22 0903  BP: 128/72  Pulse: 60  SpO2: 97%    Physical Exam Vitals reviewed.  Cardiovascular:     Rate and Rhythm: Normal rate.  Pulmonary:     Effort: Pulmonary effort is normal.  Musculoskeletal:        General: No tenderness.     Right lower leg: Edema (very minimal edema around the ankle, none in the calf or pretibial area) present.  Skin:    Findings: No bruising or erythema.  Psychiatric:        Mood and Affect: Mood normal.        Behavior: Behavior normal.        Thought Content: Thought content normal.   Villalta  Score for Post-Thrombotic Syndrome: Pain: Absent Cramps: Absent Heaviness: Absent Paresthesia: Absent Pruritus: Absent Pretibial Edema: Absent Skin Induration: Absent Hyperpigmentation: Absent Redness: Absent Venous Ectasia: Absent Pain on calf compression: Absent Villalta Preliminary Score: 0 Is venous ulcer present?: No If venous ulcer is present and score is <15, then 15 points total are assigned: Absent Villalta Total Score: 0  LABS:  CBC     Component Value Date/Time   WBC 4.4 05/28/2022 1245   RBC 4.95 05/28/2022 1245   HGB 14.2 05/28/2022 1245   HCT 42.7 05/28/2022 1245   PLT 178 05/28/2022 1245   MCV 86.3 05/28/2022 1245   MCV 86.0 12/04/2012 1552   MCH 28.7 05/28/2022 1245   MCHC 33.3 05/28/2022 1245   RDW 13.1 05/28/2022 1245   LYMPHSABS 1,914 05/28/2022 1245   MONOABS 310 10/07/2015 0905   EOSABS 40 05/28/2022 1245   BASOSABS 9 05/28/2022 1245    Hepatic Function      Component Value Date/Time   PROT 6.4 10/29/2022 0915   PROT 6.8 03/09/2013 1017   ALBUMIN 4.1 06/07/2016 0950   ALBUMIN 4.6 03/09/2013 1017   AST 14 10/29/2022 0915   ALT 18 10/29/2022 0915   ALKPHOS 73 06/07/2016 0950   BILITOT 0.7 10/29/2022 0915   BILIDIR 0.2 10/29/2022 0915   IBILI 0.5 10/29/2022 0915    Renal Function   Lab Results  Component Value Date   CREATININE 0.59 (L) 04/22/2022   CREATININE 0.56 04/17/2021   CREATININE 0.67 04/11/2020    CrCl cannot be calculated (Patient's most recent lab result is older than the maximum 21 days allowed.).   VVS Vascular Lab Studies:  08/24/22 VAS [place] LOWER EXTREMITY VENOUS (DVT)RIGHT Summary:  RIGHT:  - Findings consistent with acute deep vein thrombosis involving the right  peroneal veins.  - No cystic structure found in the popliteal fossa.    LEFT:  - No evidence of common femoral vein obstruction.   ASSESSMENT: Location of DVT: Right distal vein Cause of DVT: provoked by a transient risk factor - trauma from recent fall  and R ankle  fracture, immobilization of RLE with foot in CAM boot, and decreased mobility for the week following this injury. We have planned to treat her first provoked DVT with 3 months of anticoagulation. She has tolerated Eliquis well, and had no issues with medication access or adherence. Her symptoms related to the DVT have resolved. She continues to have some mild edema around the right ankle which her orthopedic surgeon has said is normal and will be slow to resolve. She has a few days remaining in her supply of Eliquis which we will have her complete, then she can discontinue Eliquis as this will complete her 3 months of treatment. No indication for follow up imaging. She can resume Plavix once she finishes Eliquis. All of her questions have been answered at this time.   PLAN: -Patient is discharged from the DVT Clinic. -Discontinue anticoagulation with Eliquis, as patient has completed 3 months of treatment for provoked DVT.  -Counseled patient on future VTE risk reduction strategies and to inform all future providers of DVT history.  Follow up: with PCP as otherwise indicated. DVT Clinic available as needed.   Pervis Hocking, PharmD, P[place]y [place], CPP Deep Vein Thrombosis Clinic Clinical Pharmacist Practitioner Office: [phone]

## 2022-12-05 NOTE — Progress Notes (Unsigned)
Cardiology Office Note:   Date:  12/06/2022  ID:  Rielly, Noftz August 26, 1950, MRN [id number] PCP: Margaree Mackintosh, MD  [facility] Providers Cardiologist:  Rollene Rotunda, MD {  History of Present Illness:   ASON STURM is a 72 y.o. female  who presented for followup of dyspnea.  I have seen her before for palpitatons. She thought there was a  family history of sudden cardiac death in her brother brother who had an ICD.  There was a discussion of him having a myomectomy in the past.  There has been no genetic testing.  She talked with her sister-in-law today and she found out he never had sudden death.  He does however have a defibrillator and cardiomyopathy although she was never told it was a familial problem.  Since I last saw her he died of multiple complications in part related to his cardiac issues.  I have seen her for palpitations   she did wear a monitor in the past and had sinus tachycardia. There were occasional PVCs.   She has been started on a beta blocker which we have been able to titrate. I did send her for a treadmill test and it was stopped secondary to dyspnea. There were PVCs during exercise and recovery but there were no sustained runs.   This was an adequate test and she had no ischemia.  An echo was done.  It demonstrated a normal EF and no significant abnormalities.      Since I last saw her she fractured an ankle.  She ended up getting a DVT with this and was briefly on Eliquis.  Plavix was interrupted during that time.  She is not back to doing her regular routine. The patient denies any new symptoms such as chest discomfort, neck or arm discomfort. There has been no new shortness of breath, PND or orthopnea. There have been no reported palpitations, presyncope or syncope.  She states physical in the yard.  ROS: As stated in the HPI and negative for all other systems.  Studies Reviewed:    EKG:   EKG Interpretation Date/Time:  Monday December 06 2022  10:26:09 EDT Ventricular Rate:  67 PR Interval:  230 QRS Duration:  78 QT Interval:  432 QTC Calculation: 456 R Axis:   23  Text Interpretation: Sinus rhythm with 1st degree A-V block No significant change since last tracing Confirmed by Rollene Rotunda ([id number]) on 12/06/2022 10:45:49 AM    Risk Assessment/Calculations:              Physical Exam:   VS:  BP 110/74 (BP Location: Left Arm, Patient Position: Sitting, Cuff Size: Normal)   Pulse 66   Ht 5\' 11"  (1.803 m)   Wt 220 lb 12.8 oz (100.2 kg)   SpO2 97%   BMI 30.80 kg/m    Wt Readings from Last 3 Encounters:  12/06/22 220 lb 12.8 oz (100.2 kg)  11/04/22 232 lb (105.2 kg)  08/15/22 230 lb (104.3 kg)     GEN: Well nourished, well developed in no acute distress NECK: No JVD; No carotid bruits CARDIAC: RRR, no murmurs, rubs, gallops RESPIRATORY:  Clear to auscultation without rales, wheezing or rhonchi  ABDOMEN: Soft, non-tender, non-distended EXTREMITIES:  No edema; No deformity   ASSESSMENT AND PLAN:   PALPITATIONS : She feels well taking 25 mg twice a day of metoprolol.  When she was in active her blood pressure went down so she had reduced her dose from  37.5 twice daily to this current dose and I will keep her on this regimen.   HTN: The blood pressure is well-controlled.  No change in therapy.   TIA: She has been on Plavix for years and has no sequela I from her TIA and no symptoms from the Plavix.  OBESITY: She has lost a little weight.  She will continue on this path.  DYSLIPIDEMIA:   Her LDL is 48 with an HDL of 46.  No change in therapy.     Follow up with me in one year.   Signed, Rollene Rotunda, MD

## 2022-12-06 ENCOUNTER — Encounter: Payer: Self-pay | Admitting: Cardiology

## 2022-12-06 ENCOUNTER — Ambulatory Visit: Payer: Medicare HMO | Attending: Cardiology | Admitting: Cardiology

## 2022-12-06 VITALS — BP 110/74 | HR 66 | Ht 71.0 in | Wt 220.8 lb

## 2022-12-06 DIAGNOSIS — R002 Palpitations: Secondary | ICD-10-CM

## 2022-12-06 DIAGNOSIS — G459 Transient cerebral ischemic attack, unspecified: Secondary | ICD-10-CM

## 2022-12-06 DIAGNOSIS — I10 Essential (primary) hypertension: Secondary | ICD-10-CM

## 2022-12-06 NOTE — Patient Instructions (Addendum)
Medication Instructions:  No changes  *If you need a refill on your cardiac medications before your next appointment, please call your pharmacy*   Lab Work: Not needed If you have labs (blood work) drawn today and your tests are completely normal, you will receive your results only by: MyChart Message (if you have MyChart) OR A paper copy in the mail If you have any lab test that is abnormal or we need to change your treatment, we will call you to review the results.   Testing/Procedures:  Not needed  Follow-Up: At [facility], you and your health needs are our priority.  As part of our continuing mission to provide you with exceptional heart care, we have created designated Provider Care Teams.  These Care Teams include your primary Cardiologist (physician) and Advanced Practice Providers (APPs -  Physician Assistants and Nurse Practitioners) who all work together to provide you with the care you need, when you need it.     Your next appointment:   12 month(s)  The format for your next appointment:   In Person  Provider:   None    Other Instructions

## 2022-12-09 ENCOUNTER — Telehealth: Payer: Self-pay

## 2022-12-09 DIAGNOSIS — Z860100 Personal history of colon polyps, unspecified: Secondary | ICD-10-CM | POA: Diagnosis not present

## 2022-12-09 DIAGNOSIS — Z800 Family history of malignant neoplasm of digestive organs: Secondary | ICD-10-CM | POA: Diagnosis not present

## 2022-12-09 DIAGNOSIS — K219 Gastro-esophageal reflux disease without esophagitis: Secondary | ICD-10-CM | POA: Diagnosis not present

## 2022-12-09 NOTE — Telephone Encounter (Signed)
   Pre-operative Risk Assessment    Patient Name: Katie Allen  DOB: 11-17-1950 MRN: [id number]  Last ov 12/06/2022  Upcoming visit: Unknown       Request for Surgical Clearance    Procedure:   Colonoscopy  Date of Surgery:  Clearance 01/13/23                                 Surgeon:  Dr. Kathi Der  Surgeon's Group or Practice Name:  [facility] Phone number:  [phone] Fax number:  [phone]   Type of Clearance Requested:   - Medical  - Pharmacy:  Hold Clopidogrel (Plavix) 5 days prior   Type of Anesthesia:   propofol   Additional requests/questions:    Vance Peper   12/09/2022, 11:20 AM

## 2022-12-10 NOTE — Telephone Encounter (Signed)
Dr. Antoine Poche,   You saw this patient on 12/06/2022. Per office protocol, will you please comment on medical clearance for colonoscopy?  Please route your response to P CV DIV Preop. I will communicate with requesting office once you have given recommendations.   Thank you!  Carlos Levering, NP

## 2022-12-10 NOTE — Telephone Encounter (Signed)
   Name: Katie Allen  DOB: 1951/01/17  MRN: [id number]   Primary Cardiologist: Rollene Rotunda, MD  Chart reviewed as part of pre-operative protocol coverage. Trease Bremner Reyburn was last seen on 12/06/2022 by Dr. Antoine Poche.  Per Dr. Antoine Poche, "The patient is acceptable risk for the planned colonoscopy.  She can hold her Plavix for the procedure.  Resume afterwards.  No further testing is necessary."  Therefore, based on ACC/AHA guidelines, the patient would be at acceptable risk for the planned procedure without further cardiovascular testing.   Per office protocol, he may hold Plavix for 5 days prior to procedure and should resume as soon as hemodynamically stable postoperatively.   I will route this recommendation to the requesting party via Epic fax function and remove from pre-op pool. Please call with questions.  Carlos Levering, NP 12/10/2022, 1:51 PM

## 2023-01-13 DIAGNOSIS — Z09 Encounter for follow-up examination after completed treatment for conditions other than malignant neoplasm: Secondary | ICD-10-CM | POA: Diagnosis not present

## 2023-01-13 DIAGNOSIS — K635 Polyp of colon: Secondary | ICD-10-CM | POA: Diagnosis not present

## 2023-01-13 DIAGNOSIS — D127 Benign neoplasm of rectosigmoid junction: Secondary | ICD-10-CM | POA: Diagnosis not present

## 2023-01-13 DIAGNOSIS — D124 Benign neoplasm of descending colon: Secondary | ICD-10-CM | POA: Diagnosis not present

## 2023-01-13 DIAGNOSIS — K5730 Diverticulosis of large intestine without perforation or abscess without bleeding: Secondary | ICD-10-CM | POA: Diagnosis not present

## 2023-01-13 DIAGNOSIS — D128 Benign neoplasm of rectum: Secondary | ICD-10-CM | POA: Diagnosis not present

## 2023-01-13 DIAGNOSIS — Z860100 Personal history of colon polyps, unspecified: Secondary | ICD-10-CM | POA: Diagnosis not present

## 2023-01-13 LAB — HM COLONOSCOPY

## 2023-01-17 DIAGNOSIS — D124 Benign neoplasm of descending colon: Secondary | ICD-10-CM | POA: Diagnosis not present

## 2023-01-17 DIAGNOSIS — D127 Benign neoplasm of rectosigmoid junction: Secondary | ICD-10-CM | POA: Diagnosis not present

## 2023-01-17 DIAGNOSIS — K635 Polyp of colon: Secondary | ICD-10-CM | POA: Diagnosis not present

## 2023-01-17 DIAGNOSIS — D128 Benign neoplasm of rectum: Secondary | ICD-10-CM | POA: Diagnosis not present

## 2023-01-18 DIAGNOSIS — D225 Melanocytic nevi of trunk: Secondary | ICD-10-CM | POA: Diagnosis not present

## 2023-01-18 DIAGNOSIS — L821 Other seborrheic keratosis: Secondary | ICD-10-CM | POA: Diagnosis not present

## 2023-01-18 DIAGNOSIS — Z85828 Personal history of other malignant neoplasm of skin: Secondary | ICD-10-CM | POA: Diagnosis not present

## 2023-01-18 DIAGNOSIS — L814 Other melanin hyperpigmentation: Secondary | ICD-10-CM | POA: Diagnosis not present

## 2023-01-18 DIAGNOSIS — L578 Other skin changes due to chronic exposure to nonionizing radiation: Secondary | ICD-10-CM | POA: Diagnosis not present

## 2023-01-18 DIAGNOSIS — L720 Epidermal cyst: Secondary | ICD-10-CM | POA: Diagnosis not present

## 2023-01-26 ENCOUNTER — Telehealth: Payer: Self-pay | Admitting: Internal Medicine

## 2023-01-26 MED ORDER — LISINOPRIL-HYDROCHLOROTHIAZIDE 20-25 MG PO TABS: 1.0000 | ORAL_TABLET | Freq: Every day | ORAL | 1 refills | Status: DC

## 2023-01-26 NOTE — Telephone Encounter (Signed)
Copied from CRM [id number]. Topic: Clinical - Medication Refill >> Jan 26, 2023  9:03 AM Donita Brooks wrote: Most Recent Primary Care Visit:  Provider: Margaree Mackintosh  Department: [facility]  Visit Type: Earleen Reaper VIDEO VISIT  Date: 11/09/2022  Medication: lisinopril-hydrochlorothiazide (ZESTORETIC) 20-25 MG tablet  Has the patient contacted their pharmacy? Yes (Agent: If no, request that the patient contact the pharmacy for the refill. If patient does not wish to contact the pharmacy document the reason why and proceed with request.) (Agent: If yes, when and what did the pharmacy advise?)  Is this the correct pharmacy for this prescription? Yes If no, delete pharmacy and type the correct one.  This is the patient's preferred pharmacy:  [phone] [place] [place] - [address] [place] [address] Phone: [phone] Fax: [phone]  [id number] [place] [place] - [address] [facility] [address] [place] [address] Phone: [phone] Fax: [phone]  [facility] [address] [place] [address] Phone: [phone] Fax: [phone]   Has the prescription been filled recently? No  Is the patient out of the medication? Yes  Has the patient been seen for an appointment in the last year OR does the patient have an upcoming appointment? No  Can we respond through MyChart? Yes  Agent: Please be advised that Rx refills may take up to 3 business days. We ask that you follow-up with your pharmacy.

## 2023-01-31 DIAGNOSIS — I10 Essential (primary) hypertension: Secondary | ICD-10-CM | POA: Diagnosis not present

## 2023-01-31 DIAGNOSIS — E039 Hypothyroidism, unspecified: Secondary | ICD-10-CM | POA: Diagnosis not present

## 2023-01-31 DIAGNOSIS — Z8673 Personal history of transient ischemic attack (TIA), and cerebral infarction without residual deficits: Secondary | ICD-10-CM | POA: Diagnosis not present

## 2023-01-31 DIAGNOSIS — E876 Hypokalemia: Secondary | ICD-10-CM | POA: Diagnosis not present

## 2023-01-31 DIAGNOSIS — Z7902 Long term (current) use of antithrombotics/antiplatelets: Secondary | ICD-10-CM | POA: Diagnosis not present

## 2023-01-31 DIAGNOSIS — Z8249 Family history of ischemic heart disease and other diseases of the circulatory system: Secondary | ICD-10-CM | POA: Diagnosis not present

## 2023-01-31 DIAGNOSIS — Z85828 Personal history of other malignant neoplasm of skin: Secondary | ICD-10-CM | POA: Diagnosis not present

## 2023-01-31 DIAGNOSIS — K219 Gastro-esophageal reflux disease without esophagitis: Secondary | ICD-10-CM | POA: Diagnosis not present

## 2023-01-31 DIAGNOSIS — E785 Hyperlipidemia, unspecified: Secondary | ICD-10-CM | POA: Diagnosis not present

## 2023-01-31 DIAGNOSIS — Z6830 Body mass index (BMI) 30.0-30.9, adult: Secondary | ICD-10-CM | POA: Diagnosis not present

## 2023-01-31 DIAGNOSIS — Z880 Allergy status to penicillin: Secondary | ICD-10-CM | POA: Diagnosis not present

## 2023-01-31 DIAGNOSIS — Z809 Family history of malignant neoplasm, unspecified: Secondary | ICD-10-CM | POA: Diagnosis not present

## 2023-02-03 ENCOUNTER — Other Ambulatory Visit: Payer: Self-pay | Admitting: Internal Medicine

## 2023-02-03 DIAGNOSIS — E063 Autoimmune thyroiditis: Secondary | ICD-10-CM

## 2023-02-04 ENCOUNTER — Ambulatory Visit
Admission: RE | Admit: 2023-02-04 | Discharge: 2023-02-04 | Disposition: A | Discharge status: Home / Self Care | Payer: Medicare HMO | Source: Ambulatory Visit | Attending: Internal Medicine | Admitting: Internal Medicine

## 2023-02-04 DIAGNOSIS — Z1231 Encounter for screening mammogram for malignant neoplasm of breast: Secondary | ICD-10-CM | POA: Diagnosis not present

## 2023-02-14 MED ORDER — LISINOPRIL-HYDROCHLOROTHIAZIDE 20-25 MG PO TABS: 1.0000 | ORAL_TABLET | Freq: Every day | ORAL | 1 refills | Status: DC

## 2023-02-14 NOTE — Addendum Note (Signed)
 Addended by: Gregery Na on: 02/14/2023 12:25 PM   Modules accepted: Orders

## 2023-02-15 ENCOUNTER — Other Ambulatory Visit: Payer: Self-pay | Admitting: Internal Medicine

## 2023-02-15 DIAGNOSIS — E063 Autoimmune thyroiditis: Secondary | ICD-10-CM

## 2023-02-15 MED ORDER — LEVOTHYROXINE SODIUM 50 MCG PO TABS
ORAL_TABLET | ORAL | 1 refills | Status: DC
Start: 2023-02-15 — End: 2023-05-05

## 2023-02-15 NOTE — Telephone Encounter (Signed)
 Can you resend this prescription

## 2023-02-15 NOTE — Telephone Encounter (Signed)
 Copied from CRM [id number]. Topic: Clinical - Medication Refill >> Feb 15, 2023 11:55 AM Elle L wrote: Most Recent Primary Care Visit:  Provider: [facility] RONAL PARAS  Department: [facility]  Visit Type: RHONA VIDEO VISIT  Date: 11/09/2022  Medication: levothyroxine  (LEVOXYL ) 50 MCG tablet  Has the patient contacted their pharmacy? Yes  Is this the correct pharmacy for this prescription? Yes  This is the patient's preferred pharmacy:  [id number] [place] [place] - [address] [place] [address] Phone: [phone] Fax: [phone]   Has the prescription been filled recently? Yes  Is the patient out of the medication? Yes, the patient has been out for two days.   Has the patient been seen for an appointment in the last year OR does the patient have an upcoming appointment? Yes  Can we respond through MyChart? Yes  Agent: Please be advised that Rx refills may take up to 3 business days. We ask that you follow-up with your pharmacy.

## 2023-02-15 NOTE — Telephone Encounter (Signed)
 Copied from CRM [id number]. Topic: Clinical - Prescription Issue >> Feb 15, 2023 11:59 AM Elle L wrote: Reason for CRM: The patient has been out of her levothyroxine  (LEVOXYL ) 50 MCG tablet for two days and is concerned about the up to three business day turnaround time for her prescription. She wanted to get medical advice from Dr. Perri. Her call back number is [phone].

## 2023-04-13 ENCOUNTER — Telehealth: Payer: Self-pay | Admitting: Internal Medicine

## 2023-04-13 NOTE — Telephone Encounter (Unsigned)
 Copied from CRM [id number]. Topic: General - Other >> Apr 13, 2023 11:10 AM Whitney O wrote: Reason for CRM: [facility] . Need dr to sign a urgent order . Do they have acces to co sign patient coming in tomorrow for bone density need this order signed and you can fax over . Need it signed today .

## 2023-04-14 ENCOUNTER — Ambulatory Visit
Admission: RE | Admit: 2023-04-14 | Discharge: 2023-04-14 | Disposition: A | Discharge status: Home / Self Care | Payer: Medicare HMO | Source: Ambulatory Visit | Attending: Internal Medicine | Admitting: Internal Medicine

## 2023-04-14 DIAGNOSIS — Z13820 Encounter for screening for osteoporosis: Secondary | ICD-10-CM

## 2023-04-14 DIAGNOSIS — M8588 Other specified disorders of bone density and structure, other site: Secondary | ICD-10-CM | POA: Diagnosis not present

## 2023-04-14 DIAGNOSIS — E2839 Other primary ovarian failure: Secondary | ICD-10-CM | POA: Diagnosis not present

## 2023-04-14 DIAGNOSIS — N958 Other specified menopausal and perimenopausal disorders: Secondary | ICD-10-CM | POA: Diagnosis not present

## 2023-04-14 NOTE — Telephone Encounter (Signed)
 Order was put in computer and faxed over

## 2023-04-27 NOTE — Progress Notes (Signed)
 Annual Medicare Wellness Visit   Patient Care Team: Tesla Keeler, Jaynie Meyers, MD as PCP - General (Internal Medicine) Eilleen Grates, MD as PCP - Cardiology (Cardiology)  Visit Date: 05/22/23   Chief Complaint  Patient presents with   Annual Exam    Right ear pain    Medicare Wellness   Subjective:  Patient: Katie Allen, Female DOB: February 04, 1951, 73 y.o. MRN: [id number] Katie Allen is a 73 y.o. Female who presents today for her Annual Medicare Wellness Visit. Patient has history of Hypothyroidisms/p still daily was 6 medicines have sent thyroiditis, Heart Palpitations, Hyperlipidemia, Hypertension, Shingles, Squamous Carcinoma, and Transient Ischemic Attack (TIA).   History of Hypertension; Dependent Edema; Palpitations treated with Lisinopril-HCTZ 20-25 mg daily and Metoprolol tartrate 25 mg twice daily. BP is excellent today at today at 120/80.  History of Hypokalemia treated with Klor-Con 10 meq daily. Potassium WNL at 4.6 on 05/05/2023.   History of TIA in July 2013 while on Estrogen Replacement Therapy. Had complained of numbness in arm and leg x2 weeks. MRI noted small vessel disease, but was otherwise normal. Takes Plavix 75 mg daily. In 08/2022 she experienced Acute Right DVT triggered by her Closed Right Ankle Fracture and was subsequently placed on Eliquis briefly in which during that 3 month interim she was not taking Plavix.   History of PVCs; Bigeminy; mild Carotid Stenosis 1 - 39% followed by Dr. Eilleen Grates, who she last saw on 12/06/2022. Most recent EKG on 12/06/2022 noted Sinus Rhythm w/ 1st Degree AV Block but no significant change since last tracing in 11/2021. In 2015 had an Exercise Tolerance Test, which was stopped due to Dyspnea, but showed PVCs during exercise and recovery but no sustained runs. Last echocardiogram in January 2022 was normal.   History of Hyperlipidemia treated with  Atorvastatin 80 mg daily. 05/05/2023 Lipid Panel, compared to 04/2022 with  Triglycerides 160, decreased from 179; HDL 49, elevated from 41. Coronary Cardiac Scoring 06/03/2022 was 234. There was a 4 mm nodule noted in her lung, and she wanted to discuss whether her prior exposure to second-hand smoke was a risk factor for her.   History of Impaired Glucose Tolerance with 05/05/2023 HgbA1c 6.1, no change from the past year.   History of Hashimoto's Thyroiditis treated with Levothyroxine 50 mcg daily. 05/05/2023 TSH 1.19.    History of GE Reflux treated with Omeprazole daily. Says that her stomach has been gurgling some, but she hasn't had any constipation or diarrhea.   History of Mixed Conductive & Sensorineural Hearing Loss, Right Ear w/ Restricted Hearing, Left Ear; Otosclerosis, Right Ear; mild Tinnitus seen by [facility] ENT. She says that she has been trying to wear her hearing aid, but her right ear has been aching some and feels full.   Labs 05/05/2023 CBC, compared to 04/2022: HCT 45.4, elevated from 44.2. CMP: WNL  Microalbumin/Creatinine: WNL  Mammogram 02/04/2023 normal with repeat recommendation of 2025.  Colonoscopy 01/13/2023 found Multiple Small & Medium-Mouthed Diverticula in the sigmoid colon; removed 7 Polyps total (6 tubular adenomas and 1 hyperplastic polyp per pathology) - One 4 mm sessile polyp from recto-sigmoid colon, Two 4-5 mm sessile polyps from descending colon, One 4 mm sessile polyp from the descending colon, One diminutive sessile polyp from descending colon, Two diminutive sessile polyps from rectum; Otherwise normal exam with repeat recommendation of 2027, according to her but she will.  Bone Density 04/14/2023 T-score Left Femoral Neck -1.5, osteopenic.   Vaccine Counseling: Due for Shingles 1/2 and  Tdap - discussed; UTD on Flu and PNA. Past Medical History:  Diagnosis Date   Hashimoto's thyroiditis    Heart palpitations    Hyperlipidemia    x years   Hypertension    x years   Shingles    Squamous carcinoma    L arm    TIA  (transient ischemic attack)   Medical/Surgical History Narrative:  2019 - Family History of Abdominal Aortic Aneurysm and in February she was screened for this w/o any aneurysm present.   2018 - History of Squamous Cell Carcinoma, Left Arm was removed.   2010 - Cholecystectomy   Other - Hx of: Cystocele, Urinary Incontinence Family History  Problem Relation Age of Onset   Colon cancer Mother    Heart attack Father 22       CABG in his 26s   Cardiomyopathy Brother 97   Stroke Other   Family History Narrative: Father, deceased at 36 y.o with an MI and hx of Stroke and Abdominal Aortic Aneurysm Mother, deceased at 3 y.o of Colon Cancer At least 3 Brothers - one twin deceased w/ hx of ICD (Implantable Cardioverter Device) w/ hx of A-fib and Cardiomyopathy, and possible shx of Myomectomy according to notes from Dr. Lavonne Prairie. One brother deceased from complications with Liver Disease. Hx of Hypertension in one of her brothers. 1 Sister in good health. 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 She resides in [place]. Husband has issues with heart failure. 1 adult son. Does not smoke or consume alcohol.    2024 - Husband diagnosed with Lung Cancer and brother passed.   Review of Systems  Constitutional:  Negative for chills, fever, malaise/fatigue and weight loss.  HENT:  Negative for hearing loss, sinus pain and sore throat.   Respiratory:  Negative for cough, hemoptysis and shortness of breath.   Cardiovascular:  Negative for chest pain, palpitations, leg swelling and PND.  Gastrointestinal:  Negative for abdominal pain, constipation, diarrhea, heartburn, nausea and vomiting.        (+) [place]gmi  Genitourinary:  Negative for dysuria, frequency and urgency.  Musculoskeletal:  Negative for back pain, myalgias and neck pain.  Skin:  Negative for itching and rash.  Neurological:  Negative for dizziness, tingling, seizures and headaches.  Endo/Heme/Allergies:  Negative for polydipsia.  Psychiatric/Behavioral:  Negative for depression. The patient is not nervous/anxious.     Objective:  Vitals: BP 120/80 (BP Location: Left Arm, Patient Position: Sitting, Cuff Size: Normal)   Pulse 68   Temp 98.4 F (36.9 C) (Other (Comment))   Ht 5\' 11"  (1.803 m)   Wt 232 lb (105.2 kg)   SpO2 98%   BMI 32.36 kg/m  Physical Exam Vitals and nursing note reviewed.  Constitutional:      General: She is not in acute distress.    Appearance: Normal appearance. She is not ill-appearing or toxic-appearing.  HENT:     Head: Normocephalic and atraumatic.     Right Ear: Hearing and ear canal normal.     Left Ear: Hearing, tympanic membrane, ear canal and external ear normal.     Ears:     Comments: Right TM dull, not red Right external  ear, proximal slightly erythematous  No open lesions, sores, or drainage    Mouth/Throat:     Pharynx: Oropharynx is clear.  Eyes:     Extraocular Movements: Extraocular movements intact.     Pupils: Pupils are equal, round, and reactive to light.  Neck:     Thyroid: No thyroid mass, thyromegaly or thyroid tenderness.     Vascular: No carotid bruit.  Cardiovascular:     Rate and Rhythm: Normal rate and regular rhythm. No extrasystoles are present.    Pulses:          Dorsalis pedis pulses are 2+ on the right side and 2+ on the left side.       Posterior tibial pulses are 2+ on the right side and 2+ on the left side.     Heart sounds: Normal heart sounds. No murmur heard.    No friction rub. No gallop.  Pulmonary:     Effort: Pulmonary effort is normal.     Breath sounds: Normal breath sounds. No decreased breath sounds, wheezing, rhonchi or  rales.  Chest:     Chest wall: No mass.  Abdominal:     Palpations: Abdomen is soft. There is no hepatomegaly, splenomegaly or mass.     Tenderness: There is no abdominal tenderness.     Hernia: No hernia is present.  Musculoskeletal:     Cervical back: Normal range of motion.     Right lower leg: No edema.     Left lower leg: No edema.  Lymphadenopathy:     Cervical: No cervical adenopathy.     Upper Body:     Right upper body: No supraclavicular adenopathy.     Left upper body: No supraclavicular adenopathy.  Skin:    General: Skin is warm and dry.  Neurological:     General: No focal deficit present.     Mental Status: She is alert and oriented to person, place, and time. Mental status is at baseline.     Sensory: Sensation is intact.     Motor: Motor function is intact. No weakness.     Deep Tendon Reflexes: Reflexes are normal and symmetric.  Psychiatric:        Attention and Perception: Attention normal.        Mood and Affect: Mood normal.        Speech: Speech normal.        Behavior: Behavior normal.        Thought Content: Thought content normal.        Cognition and Memory: Cognition normal.        Judgment: Judgment normal.    Most Recent Fall Risk Assessment:    05/12/2023   10:04 AM  Fall Risk   Falls in the past year? 0  Number falls in past yr: 0  Injury with Fall? 0  Follow up Falls evaluation completed   Most Recent Depression Screenings:    05/12/2023   10:04 AM 05/26/2022   12:56 PM  PHQ 2/9 Scores  PHQ - 2 Score 2 0  PHQ- 9 Score 2    Most Recent Cognitive Screening:    04/29/2022   11:15 AM  6CIT Screen  What Year? 0 points  What month? 0 points  What time? 0 points  Count back from 20 0 points  Months in reverse 0 points  Repeat phrase 0 points  Total Score 0 points   Results:  Studies Obtained And Personally Reviewed By Me:  Mammogram  02/04/2023 normal with repeat recommendation of 2025.  Colonoscopy 01/13/2023 found Multiple  Small & Medium-Mouthed Diverticula in the sigmoid colon; removed 7 Polyps total (6 tubular adenomas and 1 hyperplastic polyp per pathology) - One 4 mm sessile polyp from recto-sigmoid colon, Two 4-5 mm sessile polyps from descending colon, One 4 mm sessile polyp from the descending colon, One diminutive sessile polyp from descending colon, Two diminutive sessile polyps from rectum; Otherwise normal exam with repeat recommendation of 2029.  Bone Density 04/14/2023 T-score Left Femoral Neck -1.5, osteopenic.  Coronary Calcium Score 06/03/2023  Ascending Aorta: Normal diameter 3.3 cm   Pericardium: Normal   Coronary arteries: Calcium noted in LAD and RCA   LM 0   LAD 225   RCA 9.43   LCX 0   Total:  234   IMPRESSION: Coronary calcium score of 234. This was 34 th percentile for age and sex matched control.   FINDINGS: Non-cardiac: OVER-READ INTERPRETATION  CT CHEST  FINDINGS: Atherosclerotic calcifications are noted in the thoracic aorta. 4 mm subpleural nodule in the periphery of the right middle lobe (axial image 48 of series 2). Scattered areas of scarring in the lung bases bilaterally (left-greater-than-right) incidentally noted. Within the visualized portions of the thorax there are no suspicious appearing pulmonary nodules or masses, there is no acute consolidative airspace disease, no pleural effusions, no pneumothorax and no lymphadenopathy. Visualized portions of the upper abdomen are unremarkable. There are no aggressive appearing lytic or blastic lesions noted in the visualized portions of the skeleton.   IMPRESSION: 1. 4 mm right middle lobe pulmonary nodule, nonspecific, but statistically likely benign. No follow-up needed if patient is low-risk.This recommendation follows the consensus statement: Guidelines for Management of Incidental Pulmonary Nodules Detected on CT Images: From the Fleischner Society 2017; Radiology 2017; 284:228-243.  2.  Aortic  Atherosclerosis (ICD10-I70.0).   Labs:     Component Value Date/Time   NA 142 05/05/2023 0913   NA 142 03/09/2013 1017   K 4.6 05/05/2023 0913   CL 105 05/05/2023 0913   CO2 30 05/05/2023 0913   GLUCOSE 91 05/05/2023 0913   BUN 15 05/05/2023 0913   BUN 17 03/09/2013 1017   CREATININE 0.60 05/05/2023 0913   CALCIUM 10.3 05/05/2023 0913   PROT 6.2 05/05/2023 0913   PROT 6.8 03/09/2013 1017   ALBUMIN 4.1 06/07/2016 0950   ALBUMIN 4.6 03/09/2013 1017   AST 16 05/05/2023 0913   ALT 18 05/05/2023 0913   ALKPHOS 73 06/07/2016 0950   BILITOT 0.9 05/05/2023 0913   GFRNONAA 90 04/11/2020 0929   GFRAA 105 04/11/2020 0929    Lab Results  Component Value Date   WBC 5.6 05/05/2023   HGB 14.7 05/05/2023   HCT 45.4 (H) 05/05/2023   MCV 90.4 05/05/2023   PLT 250 05/05/2023   Lab Results  Component Value Date   CHOL 140 05/05/2023   HDL 49 (L) 05/05/2023   LDLCALC 67 05/05/2023   TRIG 160 (H) 05/05/2023   CHOLHDL 2.9 05/05/2023   Lab Results  Component Value Date   HGBA1C 6.1 (H) 05/05/2023    Lab Results  Component Value Date   TSH 1.19 05/05/2023    Assessment & Plan:   Orders Placed This Encounter  Procedures   TSH   POCT URINALYSIS DIP (CLINITEK)  Other Labs Reviewed today: CBC, compared to 04/2022: HCT 45.4, elevated from 44.2. CMP: WNL  Microalbumin/Creatinine: WNL  Hypertension; Dependent Edema; Palpitations treated with Lisinopril-HCTZ 20-25 mg daily and Metoprolol tartrate  25 mg twice daily. Blood Pressure: normotensive today at 120/80.  Hypokalemia treated with Klor-Con 10 meq daily. Potassium WNL at 4.6 on 05/05/2023.   History of TIA in July 2013 while on Estrogen Replacement Therapy. Takes Plavix 75 mg daily. In 08/2022 she experienced Acute Right DVT triggered by her Closed Right Ankle Fracture and was subsequently placed on Eliquis briefly in which during that 3 month interim she was not taking Plavix.   PVCs; Bigeminy; mild Carotid Stenosis 1 - 39%  followed by Dr. Eilleen Grates, who she last saw on 12/06/2022. Most recent EKG on 12/06/2022 noted Sinus Rhythm w/ 1st Degree AV Block but no significant change since last tracing in 11/2021. In 2015 had an Exercise Tolerance Test, which was stopped due to Dyspnea, but showed PVCs during exercise and recovery but no sustained runs. Last echocardiogram in January 2022 was normal.   Hyperlipidemia treated with  Atorvastatin 80 mg daily. 05/05/2023 Lipid Panel, compared to 04/2022 with Triglycerides 160, decreased from 179; HDL 49, elevated from 41. Coronary Cardiac Scoring 06/03/2022 was 234. There was a 4 mm nodule noted in her lung, and she wanted to discuss whether her prior exposure to second-hand smoke was a risk factor for her.   Impaired Glucose Tolerance with 05/05/2023 HgbA1c 6.1, no change from the past year.   Hashimoto's Thyroiditis treated with Levothyroxine 50 mcg daily. 05/05/2023 TSH 1.19.    GE Reflux treated with Omeprazole daily. Says that her stomach has been gurgling some, but she hasn't had any constipation or diarrhea.   Mixed Conductive & Sensorineural Hearing Loss, Right Ear w/ Restricted Hearing, Left Ear; Otosclerosis, Right Ear; mild Tinnitus seen by [facility] ENT. She says that she has been trying to wear her hearing aid, but her right ear has been aching some and feels full. On exam right TM dull, not red and right external ear, proximal slightly erythematous. Sending in Cortisporin - place 4 drops into the right ear 3 (three) times daily.   Mammogram 02/04/2023 normal with repeat recommendation of 2025.  Colonoscopy 01/13/2023 found Multiple Small & Medium-Mouthed Diverticula in the sigmoid colon; removed 7 Polyps total (6 tubular adenomas and 1 hyperplastic polyp per pathology) - One 4 mm sessile polyp from recto-sigmoid colon, Two 4-5 mm sessile polyps from descending colon, One 4 mm sessile polyp from the descending colon, One diminutive sessile polyp from descending colon,  Two diminutive sessile polyps from rectum; Otherwise normal exam with repeat recommendation of 2027, according to patient.  Bone Density 04/14/2023 T-score Left Femoral Neck -1.5, osteopenic.   Vaccine Counseling: Due for Shingles 1/2 and Tdap - discussed; UTD on Flu and PNA.   Annual wellness visit done today including the all of the following: Reviewed patient's Family Medical History Reviewed and updated list of patient's medical providers Assessment of cognitive impairment was done Assessed patient's functional ability Established a written schedule for health screening services Health Risk Assessent Completed and Reviewed  Discussed health benefits of physical activity, and encouraged her to engage in regular exercise appropriate for her age and condition.    I,Emily Lagle,acting as a Neurosurgeon for Sylvan Evener, MD.,have documented all relevant documentation on the behalf of Sylvan Evener, MD,as directed by  Sylvan Evener, MD while in the presence of Sylvan Evener, MD.   I, Sylvan Evener, MD, have reviewed all documentation for this visit. The documentation on 05/22/23 for the exam, diagnosis, procedures, and orders are all accurate and complete.

## 2023-05-01 ENCOUNTER — Other Ambulatory Visit: Payer: Self-pay | Admitting: Internal Medicine

## 2023-05-01 DIAGNOSIS — E063 Autoimmune thyroiditis: Secondary | ICD-10-CM

## 2023-05-02 NOTE — Telephone Encounter (Signed)
 Left message on machine to call back

## 2023-05-05 ENCOUNTER — Other Ambulatory Visit: Payer: Self-pay | Admitting: Internal Medicine

## 2023-05-05 ENCOUNTER — Other Ambulatory Visit: Payer: Medicare HMO

## 2023-05-05 DIAGNOSIS — E039 Hypothyroidism, unspecified: Secondary | ICD-10-CM

## 2023-05-05 DIAGNOSIS — I10 Essential (primary) hypertension: Secondary | ICD-10-CM | POA: Diagnosis not present

## 2023-05-05 DIAGNOSIS — E1169 Type 2 diabetes mellitus with other specified complication: Secondary | ICD-10-CM

## 2023-05-05 DIAGNOSIS — E063 Autoimmune thyroiditis: Secondary | ICD-10-CM

## 2023-05-05 DIAGNOSIS — E782 Mixed hyperlipidemia: Secondary | ICD-10-CM | POA: Diagnosis not present

## 2023-05-05 DIAGNOSIS — E785 Hyperlipidemia, unspecified: Secondary | ICD-10-CM | POA: Diagnosis not present

## 2023-05-05 MED ORDER — LEVOTHYROXINE SODIUM 50 MCG PO TABS: 50.0000 ug | ORAL_TABLET | Freq: Every day | ORAL | 1 refills | Status: DC

## 2023-05-05 MED ORDER — LEVOTHYROXINE SODIUM 100 MCG PO TABS
ORAL_TABLET | ORAL | 1 refills | Status: DC
Start: 2023-05-05 — End: 2023-05-05

## 2023-05-05 NOTE — Telephone Encounter (Signed)
 Patient ok with change.  Rx sent in.

## 2023-05-05 NOTE — Progress Notes (Signed)
 Lab only

## 2023-05-05 NOTE — Addendum Note (Signed)
 Addended by: Thelma Barge D on: 05/05/2023 12:53 PM   Modules accepted: Orders

## 2023-05-06 LAB — CBC WITH DIFFERENTIAL/PLATELET
Absolute Lymphocytes: 2526 {cells}/uL (ref 850–3900)
Absolute Monocytes: 392 {cells}/uL (ref 200–950)
Basophils Absolute: 22 {cells}/uL (ref 0–200)
Basophils Relative: 0.4 %
Eosinophils Absolute: 129 {cells}/uL (ref 15–500)
Eosinophils Relative: 2.3 %
HCT: 45.4 % — ABNORMAL HIGH (ref 35.0–45.0)
Hemoglobin: 14.7 g/dL (ref 11.7–15.5)
MCH: 29.3 pg (ref 27.0–33.0)
MCHC: 32.4 g/dL (ref 32.0–36.0)
MCV: 90.4 fL (ref 80.0–100.0)
MPV: 10.9 fL (ref 7.5–12.5)
Monocytes Relative: 7 %
Neutro Abs: 2531 {cells}/uL (ref 1500–7800)
Neutrophils Relative %: 45.2 %
Platelets: 250 10*3/uL (ref 140–400)
RBC: 5.02 10*6/uL (ref 3.80–5.10)
RDW: 12.9 % (ref 11.0–15.0)
Total Lymphocyte: 45.1 %
WBC: 5.6 10*3/uL (ref 3.8–10.8)

## 2023-05-06 LAB — COMPLETE METABOLIC PANEL WITHOUT GFR
AG Ratio: 2.3 (calc) (ref 1.0–2.5)
ALT: 18 U/L (ref 6–29)
AST: 16 U/L (ref 10–35)
Albumin: 4.3 g/dL (ref 3.6–5.1)
Alkaline phosphatase (APISO): 76 U/L (ref 37–153)
BUN: 15 mg/dL (ref 7–25)
CO2: 30 mmol/L (ref 20–32)
Calcium: 10.3 mg/dL (ref 8.6–10.4)
Chloride: 105 mmol/L (ref 98–110)
Creat: 0.6 mg/dL (ref 0.60–1.00)
Globulin: 1.9 g/dL (ref 1.9–3.7)
Glucose, Bld: 91 mg/dL (ref 65–99)
Potassium: 4.6 mmol/L (ref 3.5–5.3)
Sodium: 142 mmol/L (ref 135–146)
Total Bilirubin: 0.9 mg/dL (ref 0.2–1.2)
Total Protein: 6.2 g/dL (ref 6.1–8.1)

## 2023-05-06 LAB — LIPID PANEL
Cholesterol: 140 mg/dL (ref <200)
HDL: 49 mg/dL — ABNORMAL LOW (ref >=50)
LDL Cholesterol (Calc): 67 mg/dL
Non-HDL Cholesterol (Calc): 91 mg/dL (ref <130)
Total CHOL/HDL Ratio: 2.9 (calc) (ref <5.0)
Triglycerides: 160 mg/dL — ABNORMAL HIGH (ref <150)

## 2023-05-06 LAB — MICROALBUMIN / CREATININE URINE RATIO
Creatinine, Urine: 109 mg/dL (ref 20–275)
Microalb Creat Ratio: 6 mg/g{creat} (ref <30)
Microalb, Ur: 0.7 mg/dL

## 2023-05-06 LAB — HEMOGLOBIN A1C
Hgb A1c MFr Bld: 6.1 %{Hb} — ABNORMAL HIGH (ref <5.7)
Mean Plasma Glucose: 128 mg/dL
eAG (mmol/L): 7.1 mmol/L

## 2023-05-06 LAB — TSH: TSH: 1.19 m[IU]/L (ref 0.40–4.50)

## 2023-05-12 ENCOUNTER — Encounter: Payer: Self-pay | Admitting: Internal Medicine

## 2023-05-12 ENCOUNTER — Ambulatory Visit: Payer: Medicare HMO | Admitting: Internal Medicine

## 2023-05-12 VITALS — BP 120/80 | HR 68 | Temp 98.4°F | Ht 71.0 in | Wt 232.0 lb

## 2023-05-12 DIAGNOSIS — I10 Essential (primary) hypertension: Secondary | ICD-10-CM

## 2023-05-12 DIAGNOSIS — E039 Hypothyroidism, unspecified: Secondary | ICD-10-CM

## 2023-05-12 DIAGNOSIS — Z0000 Encounter for general adult medical examination without abnormal findings: Secondary | ICD-10-CM | POA: Diagnosis not present

## 2023-05-12 DIAGNOSIS — E782 Mixed hyperlipidemia: Secondary | ICD-10-CM

## 2023-05-12 DIAGNOSIS — Z7901 Long term (current) use of anticoagulants: Secondary | ICD-10-CM

## 2023-05-12 DIAGNOSIS — H903 Sensorineural hearing loss, bilateral: Secondary | ICD-10-CM

## 2023-05-12 DIAGNOSIS — Z8673 Personal history of transient ischemic attack (TIA), and cerebral infarction without residual deficits: Secondary | ICD-10-CM

## 2023-05-12 DIAGNOSIS — Z8719 Personal history of other diseases of the digestive system: Secondary | ICD-10-CM

## 2023-05-12 DIAGNOSIS — R7302 Impaired glucose tolerance (oral): Secondary | ICD-10-CM

## 2023-05-12 DIAGNOSIS — E876 Hypokalemia: Secondary | ICD-10-CM

## 2023-05-12 DIAGNOSIS — E1169 Type 2 diabetes mellitus with other specified complication: Secondary | ICD-10-CM

## 2023-05-12 DIAGNOSIS — I493 Ventricular premature depolarization: Secondary | ICD-10-CM

## 2023-05-12 DIAGNOSIS — I6501 Occlusion and stenosis of right vertebral artery: Secondary | ICD-10-CM

## 2023-05-12 DIAGNOSIS — E785 Hyperlipidemia, unspecified: Secondary | ICD-10-CM

## 2023-05-12 LAB — POCT URINALYSIS DIP (CLINITEK)
Bilirubin, UA: NEGATIVE
Blood, UA: NEGATIVE
Glucose, UA: NEGATIVE mg/dL
Ketones, POC UA: NEGATIVE mg/dL
Leukocytes, UA: NEGATIVE
Nitrite, UA: NEGATIVE
POC PROTEIN,UA: NEGATIVE
Spec Grav, UA: 1.015 (ref 1.010–1.025)
Urobilinogen, UA: 0.2 U/dL
pH, UA: 6 (ref 5.0–8.0)

## 2023-05-12 MED ORDER — NEOMYCIN-POLYMYXIN-HC 3.5-10000-1 OT SOLN: 4.0000 [drp] | Freq: Three times a day (TID) | OTIC | 0 refills | Status: AC

## 2023-05-22 ENCOUNTER — Encounter: Payer: Self-pay | Admitting: Internal Medicine

## 2023-05-22 NOTE — Patient Instructions (Addendum)
 It was a pleasure to see you today. Labs are stable.Continue current meds and return in one year or as needed.

## 2023-06-08 ENCOUNTER — Other Ambulatory Visit: Payer: Self-pay | Admitting: Internal Medicine

## 2023-06-08 DIAGNOSIS — E063 Autoimmune thyroiditis: Secondary | ICD-10-CM

## 2023-06-10 ENCOUNTER — Other Ambulatory Visit: Payer: Self-pay

## 2023-06-10 ENCOUNTER — Other Ambulatory Visit: Payer: Self-pay | Admitting: Internal Medicine

## 2023-06-10 DIAGNOSIS — E063 Autoimmune thyroiditis: Secondary | ICD-10-CM

## 2023-06-10 MED ORDER — LEVOTHYROXINE SODIUM 50 MCG PO TABS: 50.0000 ug | ORAL_TABLET | Freq: Every day | ORAL | 1 refills | Status: AC

## 2023-06-14 ENCOUNTER — Other Ambulatory Visit: Payer: Self-pay | Admitting: Internal Medicine

## 2023-07-28 ENCOUNTER — Other Ambulatory Visit: Payer: Self-pay | Admitting: Family

## 2023-08-02 LAB — HM DIABETES EYE EXAM

## 2023-08-03 DIAGNOSIS — H5213 Myopia, bilateral: Secondary | ICD-10-CM | POA: Diagnosis not present

## 2023-08-03 DIAGNOSIS — H524 Presbyopia: Secondary | ICD-10-CM | POA: Diagnosis not present

## 2023-08-03 DIAGNOSIS — D3132 Benign neoplasm of left choroid: Secondary | ICD-10-CM | POA: Diagnosis not present

## 2023-08-03 DIAGNOSIS — H2513 Age-related nuclear cataract, bilateral: Secondary | ICD-10-CM | POA: Diagnosis not present

## 2023-08-03 DIAGNOSIS — H43813 Vitreous degeneration, bilateral: Secondary | ICD-10-CM | POA: Diagnosis not present

## 2023-08-03 DIAGNOSIS — H52223 Regular astigmatism, bilateral: Secondary | ICD-10-CM | POA: Diagnosis not present

## 2023-09-15 ENCOUNTER — Ambulatory Visit: Admitting: Internal Medicine

## 2023-09-15 ENCOUNTER — Encounter: Payer: Self-pay | Admitting: Internal Medicine

## 2023-09-15 VITALS — Ht 71.0 in | Wt 232.0 lb

## 2023-09-15 DIAGNOSIS — Z0000 Encounter for general adult medical examination without abnormal findings: Secondary | ICD-10-CM

## 2023-09-15 NOTE — Patient Instructions (Signed)
 Next appointment: Follow up in one year for your annual wellness visit    Preventive Care 72 Years and Older, Female Preventive care refers to lifestyle choices and visits with your health care provider that can promote health and wellness. What does preventive care include? A yearly physical exam. This is also called an annual well check. Dental exams once or twice a year. Routine eye exams. Ask your health care provider how often you should have your eyes checked. Personal lifestyle choices, including: Daily care of your teeth and gums. Regular physical activity. Eating a healthy diet. Avoiding tobacco and drug use. Limiting alcohol use. Practicing safe sex. Taking low-dose aspirin every day. Taking vitamin and mineral supplements as recommended by your health care provider. What happens during an annual well check? The services and screenings done by your health care provider during your annual well check will depend on your age, overall health, lifestyle risk factors, and family history of disease. Counseling  Your health care provider may ask you questions about your: Alcohol use. Tobacco use. Drug use. Emotional well-being. Home and relationship well-being. Sexual activity. Eating habits. History of falls. Memory and ability to understand (cognition). Work and work Astronomer. Reproductive health. Screening  You may have the following tests or measurements: Height, weight, and BMI. Blood pressure. Lipid and cholesterol levels. These may be checked every 5 years, or more frequently if you are over 72 years old. Skin check. Lung cancer screening. You may have this screening every year starting at age 72 if you have a 30-pack-year history of smoking and currently smoke or have quit within the past 15 years. Fecal occult blood test (FOBT) of the stool. You may have this test every year starting at age 72. Flexible sigmoidoscopy or colonoscopy. You may have a  sigmoidoscopy every 5 years or a colonoscopy every 10 years starting at age 72. Hepatitis C blood test. Hepatitis B blood test. Sexually transmitted disease (STD) testing. Diabetes screening. This is done by checking your blood sugar (glucose) after you have not eaten for a while (fasting). You may have this done every 1-3 years. Bone density scan. This is done to screen for osteoporosis. You may have this done starting at age 72. Mammogram. This may be done every 1-2 years. Talk to your health care provider about how often you should have regular mammograms. Talk with your health care provider about your test results, treatment options, and if necessary, the need for more tests. Vaccines  Your health care provider may recommend certain vaccines, such as: Influenza vaccine. This is recommended every year. Tetanus, diphtheria, and acellular pertussis (Tdap, Td) vaccine. You may need a Td booster every 10 years. Zoster vaccine. You may need this after age 55. Pneumococcal 13-valent conjugate (PCV13) vaccine. One dose is recommended after age 72. Pneumococcal polysaccharide (PPSV23) vaccine. One dose is recommended after age 72. Talk to your health care provider about which screenings and vaccines you need and how often you need them. This information is not intended to replace advice given to you by your health care provider. Make sure you discuss any questions you have with your health care provider. Document Released: 02/21/2015 Document Revised: 10/15/2015 Document Reviewed: 11/26/2014 Elsevier Interactive Patient Education  2017 [employer].  Fall Prevention in the Home Falls can cause injuries. They can happen to people of all ages. There are many things you can do to make your home safe and to help prevent falls. What can I do on the outside of  my home? Regularly fix the edges of walkways and driveways and fix any cracks. Remove anything that might make you trip as you walk through a  door, such as a raised step or threshold. Trim any bushes or trees on the path to your home. Use bright outdoor lighting. Clear any walking paths of anything that might make someone trip, such as rocks or tools. Regularly check to see if handrails are loose or broken. Make sure that both sides of any steps have handrails. Any raised decks and porches should have guardrails on the edges. Have any leaves, snow, or ice cleared regularly. Use sand or salt on walking paths during winter. Clean up any spills in your garage right away. This includes oil or grease spills. What can I do in the bathroom? Use night lights. Install grab bars by the toilet and in the tub and shower. Do not use towel bars as grab bars. Use non-skid mats or decals in the tub or shower. If you need to sit down in the shower, use a plastic, non-slip stool. Keep the floor dry. Clean up any water that spills on the floor as soon as it happens. Remove soap buildup in the tub or shower regularly. Attach bath mats securely with double-sided non-slip rug tape. Do not have throw rugs and other things on the floor that can make you trip. What can I do in the bedroom? Use night lights. Make sure that you have a light by your bed that is easy to reach. Do not use any sheets or blankets that are too big for your bed. They should not hang down onto the floor. Have a firm chair that has side arms. You can use this for support while you get dressed. Do not have throw rugs and other things on the floor that can make you trip. What can I do in the kitchen? Clean up any spills right away. Avoid walking on wet floors. Keep items that you use a lot in easy-to-reach places. If you need to reach something above you, use a strong step stool that has a grab bar. Keep electrical cords out of the way. Do not use floor polish or wax that makes floors slippery. If you must use wax, use non-skid floor wax. Do not have throw rugs and other things  on the floor that can make you trip. What can I do with my stairs? Do not leave any items on the stairs. Make sure that there are handrails on both sides of the stairs and use them. Fix handrails that are broken or loose. Make sure that handrails are as long as the stairways. Check any carpeting to make sure that it is firmly attached to the stairs. Fix any carpet that is loose or worn. Avoid having throw rugs at the top or bottom of the stairs. If you do have throw rugs, attach them to the floor with carpet tape. Make sure that you have a light switch at the top of the stairs and the bottom of the stairs. If you do not have them, ask someone to add them for you. What else can I do to help prevent falls? Wear shoes that: Do not have high heels. Have rubber bottoms. Are comfortable and fit you well. Are closed at the toe. Do not wear sandals. If you use a stepladder: Make sure that it is fully opened. Do not climb a closed stepladder. Make sure that both sides of the stepladder are locked into place. Ask  someone to hold it for you, if possible. Clearly mark and make sure that you can see: Any grab bars or handrails. First and last steps. Where the edge of each step is. Use tools that help you move around (mobility aids) if they are needed. These include: Canes. Walkers. Scooters. Crutches. Turn on the lights when you go into a dark area. Replace any light bulbs as soon as they burn out. Set up your furniture so you have a clear path. Avoid moving your furniture around. If any of your floors are uneven, fix them. If there are any pets around you, be aware of where they are. Review your medicines with your doctor. Some medicines can make you feel dizzy. This can increase your chance of falling. Ask your doctor what other things that you can do to help prevent falls. This information is not intended to replace advice given to you by your health care provider. Make sure you discuss any  questions you have with your health care provider. Document Released: 11/21/2008 Document Revised: 07/03/2015 Document Reviewed: 03/01/2014 Elsevier Interactive Patient Education  2017 [employer].

## 2023-09-15 NOTE — Progress Notes (Signed)
 Subjective:   Katie Allen is a 73 y.o. female who presents for Medicare Annual (Subsequent) preventive examination.  Visit Complete: Virtual I connected with  Katie Allen on 09/15/23 by a audio enabled telemedicine application and verified that I am speaking with the correct person using two identifiers.  Patient Location: Home  Provider Location: Office/Clinic  I discussed the limitations of evaluation and management by telemedicine. The patient expressed understanding and agreed to proceed.  Vital Signs: Because this visit was a virtual/telehealth visit, some criteria may be missing or patient reported. Any vitals not documented were not able to be obtained and vitals that have been documented are patient reported.  Patient Medicare AWV questionnaire was completed by the patient on 09/15/2023; I have confirmed that all information answered by patient is correct and no changes since this date.  Cardiac Risk Factors include: advanced age (>9men, >83 women);dyslipidemia;hypertension     Objective:    Today's Vitals   09/15/23 0933 09/15/23 0939  Weight: 232 lb (105.2 kg)   Height: 5' 11 (1.803 m)   PainSc: 0-No pain 0-No pain   Body mass index is 32.36 kg/m.     09/15/2023    9:34 AM 09/23/2022   10:37 AM 08/15/2022    1:30 PM 04/29/2022   11:14 AM 04/24/2021    9:53 AM 09/07/2011    5:00 AM  Advanced Directives  Does Patient Have a Medical Advance Directive? No No No No No Patient does not have advance directive;Patient would not like information   Would patient like information on creating a medical advance directive? No - Patient declined   No - Patient declined No - Patient declined      Data saved with a previous flowsheet row definition    Current Medications (verified) Outpatient Encounter Medications as of 09/15/2023  Medication Sig   Ascorbic Acid  (VITAMIN C ) 500 MG tablet Take 500 mg by mouth daily.   atorvastatin  (LIPITOR) 80 MG tablet TAKE 1 TABLET  BY MOUTH EVERY DAY   b complex vitamins capsule Take 1 capsule by mouth daily.   Cholecalciferol  50 MCG (2000 UT) CAPS Take by mouth.   clopidogrel  (PLAVIX ) 75 MG tablet TAKE 1 TABLET BY MOUTH WITH BREAKFAST   DENTAGEL 1.1 % GEL dental gel    levothyroxine  (LEVOXYL ) 50 MCG tablet Take 1 tablet (50 mcg total) by mouth daily. Please specify directions, refills and quantity   levothyroxine  (SYNTHROID ) 50 MCG tablet TAKE 1 TABLET BY MOUTH EVERY DAY   lisinopril -hydrochlorothiazide  (ZESTORETIC ) 20-25 MG tablet Take 1 tablet by mouth daily.   metoprolol  tartrate (LOPRESSOR ) 25 MG tablet TAKE 1.5 TABLETS (37.5 MG TOTAL) BY MOUTH TWICE A DAY   Multiple Vitamin (MULTIVITAMIN) tablet Take 1 tablet by mouth daily.   neomycin -polymyxin-hydrocortisone (CORTISPORIN) OTIC solution Place 4 drops into the right ear 3 (three) times daily.   potassium chloride  (KLOR-CON ) 10 MEQ tablet TAKE 1 TABLET BY MOUTH EVERY DAY   No facility-administered encounter medications on file as of 09/15/2023.    Allergies (verified) Penicillins and Codeine    History: Past Medical History:  Diagnosis Date   Allergy years ago   penicillin   GERD (gastroesophageal reflux disease) years ago   Hashimoto's thyroiditis    Heart palpitations    Hyperlipidemia    x years   Hypertension    x years   Shingles    Squamous carcinoma    L arm    Stroke (HCC) 2013   TIA (transient ischemic attack)  Past Surgical History:  Procedure Laterality Date   CHOLECYSTECTOMY     TUBAL LIGATION  1976   Family History  Problem Relation Age of Onset   Colon cancer Mother    Cancer Mother    Heart attack Father 47       CABG in his 15s   Diabetes Father    Heart disease Father    Hypertension Father    Stroke Father    Varicose Veins Father    Cardiomyopathy Brother 23   Stroke Other    Social History   Socioeconomic History   Marital status: Married    Spouse name: Katie Allen   Number of children: 1   Years of education: HS    Highest education level: 12th grade  Occupational History    Employer: [employer]    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 She resides in [place]. Husband has issues with heart failure. 1 adult son. Does not smoke or consume alcohol.    2024 - Husband diagnosed with Lung Cancer and brother passed.   Social Drivers of Corporate investment banker Strain: Low Risk  (09/15/2023)   Overall Financial Resource Strain (CARDIA)    Difficulty of Paying Living Expenses: Not hard at all  Food Insecurity: No Food Insecurity (09/15/2023)   Hunger Vital Sign    Worried About Running Out of Food in the Last Year: Never true    Ran Out of Food in the Last Year: Never true  Transportation Needs: No Transportation Needs (09/15/2023)   PRAPARE - Administrator, Civil Service (Medical): No    Lack of Transportation (Non-Medical): No  Physical Activity: Insufficiently Active (09/15/2023)   Exercise Vital Sign    Days of Exercise per Week: 3 days    Minutes of Exercise per Session: 20 min  Stress: No Stress Concern Present (09/15/2023)   [employer] of Occupational Health - Occupational Stress Questionnaire    Feeling of Stress: Not at all  Social Connections: Moderately Integrated (09/15/2023)   Social Connection and Isolation Panel    Frequency of Communication with Friends and Family: Once a week     Frequency of Social Gatherings with Friends and Family: Once a week    Attends Religious Services: More than 4 times per year    Active Member of [employer] or Organizations: Yes    Attends Engineer, structural: More than 4 times per year    Marital Status: Married    Tobacco Counseling Counseling given: No   Clinical Intake:  Pre-visit preparation completed: No  Pain : No/denies pain Pain Score: 0-No pain     BMI - recorded: 32.36 Nutritional Status: BMI > 30  Obese Diabetes: Yes  How often do you need to have someone help you when you read instructions, pamphlets, or other written materials from your  doctor or pharmacy?: 1 - Never  Interpreter Needed?: No  Information entered by :: Kathlynn Porto, CMA   Activities of Daily Living    09/15/2023    9:18 AM 09/14/2023    3:39 PM  In your present state of health, do you have any difficulty performing the following activities:  Hearing? 0 0  Vision? 0 0  Difficulty concentrating or making decisions? 0   Walking or climbing stairs? 0   Dressing or bathing? 0   Doing errands, shopping? 0 0  Preparing Food and eating ? N N  Using the Toilet? N N  In the past six months, have you accidently leaked urine? N Y  Do you have problems with loss of bowel control? N N  Managing your Medications? N N  Managing your Finances? N N  Housekeeping or managing your Housekeeping? N N    Patient Care Team: Perri Ronal PARAS, MD as PCP - General (Internal Medicine) Lavona Agent, MD as PCP - Cardiology (Cardiology)  Indicate any recent Medical Services you may have received from other than [facility] providers in the past year (date may be approximate).     Assessment:   This is a routine wellness examination for [place].  Hearing/Vision screen No results found.   Goals Addressed   None    Depression Screen    09/15/2023    9:35 AM 05/12/2023   10:04 AM 05/26/2022   12:56 PM 04/29/2022   11:14 AM 10/23/2021   11:36 AM  04/24/2021    9:54 AM 04/18/2020   10:35 AM  PHQ 2/9 Scores  PHQ - 2 Score 0 2 0 0 0 0 0  PHQ- 9 Score  2         Fall Risk    09/15/2023    9:19 AM 09/14/2023    3:39 PM 09/12/2023    3:07 PM 05/12/2023   10:04 AM 05/26/2022   12:56 PM  Fall Risk   Falls in the past year? 0 0 0 0 0  Number falls in past yr: 0 0 0 0 0  Injury with Fall? 0 1 1 0 0  Risk for fall due to : No Fall Risks    No Fall Risks  Follow up Falls prevention discussed;Education provided;Falls evaluation completed   Falls evaluation completed Falls prevention discussed    MEDICARE RISK AT HOME: Medicare Risk at Home Any stairs in or around the home?: Yes If so, are there any without handrails?: No Home free of loose throw rugs in walkways, pet beds, electrical cords, etc?: Yes Adequate lighting in your home to reduce risk of falls?: Yes Life alert?: No Use of a cane, walker or w/c?: No Grab bars in the bathroom?: No Shower chair or bench in shower?: No Elevated toilet seat or a handicapped toilet?: No  TIMED UP AND GO:  Was the test performed?  No    Cognitive Function:        09/15/2023    9:44 AM 04/29/2022   11:15 AM 04/24/2021    9:55 AM  6CIT Screen  What Year? 0 points 0 points   What month? 0 points 0 points   What time? 0 points 0 points 0 points  Count back from 20 0 points 0 points 0 points  Months in reverse 0 points 0 points 0 points  Repeat phrase 0 points 0 points 0 points  Total Score 0 points 0 points     Immunizations Immunization History  Administered  Date(s) Administered   DTaP 05/05/2010   Influenza,inj,Quad PF,6+ Mos 12/04/2012   PNEUMOCOCCAL CONJUGATE-20 04/29/2022   Pneumococcal Conjugate-13 02/10/2017   Pneumococcal Polysaccharide-23 03/02/2018    TDAP status: Due, Education has been provided regarding the importance of this vaccine. Advised may receive this vaccine at local pharmacy or Health Dept. Aware to provide a copy of the vaccination record if obtained from local  pharmacy or Health Dept. Verbalized acceptance and understanding.  Flu Vaccine status: Due, Education has been provided regarding the importance of this vaccine. Advised may receive this vaccine at local pharmacy or Health Dept. Aware to provide a copy of the vaccination record if obtained from local pharmacy or Health Dept. Verbalized acceptance and understanding.  Pneumococcal vaccine status: Up to date  Covid-19 vaccine status: Information provided on how to obtain vaccines.   Qualifies for Shingles Vaccine? Yes   Zostavax completed No   Shingrix Completed?: No.    Education has been provided regarding the importance of this vaccine. Patient has been advised to call insurance company to determine out of pocket expense if they have not yet received this vaccine. Advised may also receive vaccine at local pharmacy or Health Dept. Verbalized acceptance and understanding.  Screening Tests Health Maintenance  Topic Date Due   FOOT EXAM  Never done   OPHTHALMOLOGY EXAM  Never done   Zoster Vaccines- Shingrix (1 of 2) Never done   DTaP/Tdap/Td (2 - Tdap) 05/04/2020   INFLUENZA VACCINE  09/09/2023   HEMOGLOBIN A1C  11/05/2023   MAMMOGRAM  02/04/2024   Diabetic kidney evaluation - eGFR measurement  05/04/2024   Diabetic kidney evaluation - Urine ACR  05/04/2024   Medicare Annual Wellness (AWV)  05/11/2024   Colonoscopy  01/13/2028   Pneumococcal Vaccine: 50+ Years  Completed   DEXA SCAN  Completed   Hepatitis B Vaccines  Aged Out   HPV VACCINES  Aged Out   Meningococcal B Vaccine  Aged Out   COVID-19 Vaccine  Discontinued   Hepatitis C Screening  Discontinued    Health Maintenance  Health Maintenance Due  Topic Date Due   FOOT EXAM  Never done   OPHTHALMOLOGY EXAM  Never done   Zoster Vaccines- Shingrix (1 of 2) Never done   DTaP/Tdap/Td (2 - Tdap) 05/04/2020   INFLUENZA VACCINE  09/09/2023    Colorectal cancer screening: Type of screening: Colonoscopy. Completed 01/13/2023.  Repeat every 5 years  Mammogram status: Completed 02/04/2023. Repeat every year  Bone Density status: Completed 04/14/2023. Results reflect: Bone density results: OSTEOPENIA. Repeat every 2 years.  Lung Cancer Screening: (Low Dose CT Chest recommended if Age 21-80 years, 20 pack-year currently smoking OR have quit w/in 15years.) does not qualify.   Additional Screening:  Hepatitis C Screening: does not qualify;   Vision Screening: Recommended annual ophthalmology exams for early detection of glaucoma and other disorders of the eye. Is the patient up to date with their annual eye exam?  Yes  Who is the provider or what is the name of the office in which the patient attends annual eye exams? [facility] [place] .  If pt is not established with a provider, would they like to be referred to a provider to establish care? No .   Dental Screening: Recommended annual dental exams for proper oral hygiene  Diabetic Foot Exam: Diabetic Foot Exam: Overdue, Pt has been advised about the importance in completing this exam. Pt is scheduled for diabetic foot exam on 05/14/2024.  Community Resource Referral / Chronic  Care Management: CRR required this visit?  No   CCM required this visit?  No     Plan:     I have personally reviewed and noted the following in the patient's chart:   Medical and social history Use of alcohol, tobacco or illicit drugs  Current medications and supplements including opioid prescriptions. Patient is not currently taking opioid prescriptions. Functional ability and status Nutritional status Physical activity Advanced directives List of other physicians Hospitalizations, surgeries, and ER visits in previous 12 months Vitals Screenings to include cognitive, depression, and falls Referrals and appointments  In addition, I have reviewed and discussed with patient certain preventive protocols, quality metrics, and best practice recommendations. A written  personalized care plan for preventive services as well as general preventive health recommendations were provided to patient.     Suyash Amory Zelda, CMA   09/15/2023   After Visit Summary: (Mail) Due to this being a telephonic visit, the after visit summary with patients personalized plan was offered to patient via mail

## 2023-10-05 ENCOUNTER — Other Ambulatory Visit: Payer: Self-pay | Admitting: Internal Medicine

## 2023-10-12 ENCOUNTER — Other Ambulatory Visit: Payer: Self-pay | Admitting: Family

## 2023-10-17 ENCOUNTER — Other Ambulatory Visit: Payer: Self-pay | Admitting: Internal Medicine

## 2023-10-17 MED ORDER — ATORVASTATIN CALCIUM 80 MG PO TABS: 80.0000 mg | ORAL_TABLET | Freq: Every day | ORAL | 3 refills | Status: AC

## 2023-10-17 MED ORDER — CLOPIDOGREL BISULFATE 75 MG PO TABS: ORAL_TABLET | ORAL | 3 refills | Status: AC

## 2023-10-17 NOTE — Telephone Encounter (Signed)
 Copied from CRM [id number]. Topic: Clinical - Medication Refill >> Oct 17, 2023 10:34 AM Winona R wrote: Medication:  atorvastatin  (LIPITOR) 80 MG tablet  clopidogrel  (PLAVIX ) 75 MG tablet    Has the patient contacted their pharmacy? Yes. Pt states Medication was previously filled by NP [place] while Dr. Perri was out back in 10/2022 and she now need it filled by Dr. Perri  (Agent: If no, request that the patient contact the pharmacy for the refill. If patient does not wish to contact the pharmacy document the reason why and proceed with request.) (Agent: If yes, when and what did the pharmacy advise?)  This is the patient's preferred pharmacy:  [id number] - [place], [place] - [address] [place] [address] Phone: [phone] Fax: [phone]   Is this the correct pharmacy for this prescription? Yes If no, delete pharmacy and type the correct one.   Has the prescription been filled recently? No  Is the patient out of the medication? Yes  Has the patient been seen for an appointment in the last year OR does the patient have an upcoming appointment? Yes  Can we respond through MyChart? Yes  Agent: Please be advised that Rx refills may take up to 3 business days. We ask that you follow-up with your pharmacy.

## 2023-10-21 ENCOUNTER — Encounter: Payer: Self-pay | Admitting: Internal Medicine

## 2023-10-29 ENCOUNTER — Other Ambulatory Visit: Payer: Self-pay | Admitting: Internal Medicine

## 2023-12-06 ENCOUNTER — Other Ambulatory Visit: Payer: Self-pay | Admitting: Internal Medicine

## 2023-12-06 DIAGNOSIS — Z1231 Encounter for screening mammogram for malignant neoplasm of breast: Secondary | ICD-10-CM

## 2023-12-07 ENCOUNTER — Ambulatory Visit: Admitting: Cardiology

## 2023-12-07 NOTE — Progress Notes (Unsigned)
 Cardiology Office Note:   Date:  12/08/2023  ID:  Katie, Allen [birth year], MRN [id number] PCP: Perri Ronal PARAS, MD  [facility] Providers Cardiologist:  Lynwood Schilling, MD {  History of Present Illness:   Katie Allen is a 73 y.o. female who presented for followup of dyspnea.  I have seen her before for palpitatons. She there was a  family history of sudden cardiac death in her brother who had an ICD.  There was a discussion of him having a myomectomy in the past.  There has been no genetic testing.  She talked with her sister-in-law today and she found out he never had sudden death.  He does however have a defibrillator and cardiomyopathy although she was never told it was a familial problem.  He  died of multiple complications in part related to his cardiac issues.  I have seen her for palpitations   She did wear a monitor in the past and had sinus tachycardia. There were occasional PVCs.   She has been started on a beta blocker which we have been able to titrate. I did send her for a treadmill test and it was stopped secondary to dyspnea. There were PVCs during exercise and recovery but there were no sustained runs.   This was an adequate test and she had no ischemia.  An echo was done.  It demonstrated a normal EF and no significant abnormalities.     Since I last saw her she has had no new acute cardiac problems.  The patient denies any new symptoms such as chest discomfort, neck or arm discomfort. There has been no new shortness of breath, PND or orthopnea. There have been no reported palpitations, presyncope or syncope.   Her most exerting activity is housework.  She will do vacuuming.  With that she denies any cardiovascular symptoms.  ROS: As stated in the HPI and negative for all other systems.  Studies Reviewed:    EKG:   EKG Interpretation Date/Time:  Thursday December 08 2023 09:37:21 EDT Ventricular Rate:  64 PR Interval:  200 QRS Duration:  76 QT  Interval:  410 QTC Calculation: 422 R Axis:   19  Text Interpretation: Normal sinus rhythm Normal ECG When compared with ECG of 06-Dec-2022 10:26, No significant change since last tracing Confirmed by Schilling Lynwood ([id number]) on 12/08/2023 9:56:49 AM    Risk Assessment/Calculations:              Physical Exam:   VS:  BP 128/78   Pulse 64   Ht 5' 11 (1.803 m)   Wt 245 lb (111.1 kg)   SpO2 94%   BMI 34.17 kg/m    Wt Readings from Last 3 Encounters:  12/08/23 245 lb (111.1 kg)  09/15/23 232 lb (105.2 kg)  05/12/23 232 lb (105.2 kg)     GEN: Well nourished, well developed in no acute distress NECK: No JVD; No carotid bruits CARDIAC: RRR, no murmurs, rubs, gallops RESPIRATORY:  Clear to auscultation without rales, wheezing or rhonchi  ABDOMEN: Soft, non-tender, non-distended EXTREMITIES:  No edema; No deformity   ASSESSMENT AND PLAN:   PALPITATIONS : The patient tolerates 25 mg twice a day of metoprolol  which seems to control palpitations.  She did not tolerate a higher dose.  No change in therapy.   HTN: The blood pressure is at target.  No change in therapy.   TIA:   She remains on Plavix  and has had no TIA-like symptoms.  No change in therapy.   DYSLIPIDEMIA:   Her LDL is 67 with an HDL 49.  No change in therapy.    FAMILY HISTORY: The patient has a family history as above with her brother and an ICD.  She has not had any findings on echo and there is no genetics from him that we can follow.  With diagnosis is not clear.  No change in therapy.  We have had long conversations about this.   Follow up with me in 2 years  Signed, Lynwood Schilling, MD

## 2023-12-08 ENCOUNTER — Ambulatory Visit: Attending: Internal Medicine | Admitting: Cardiology

## 2023-12-08 ENCOUNTER — Encounter: Payer: Self-pay | Admitting: Cardiology

## 2023-12-08 VITALS — BP 128/78 | HR 64 | Ht 71.0 in | Wt 245.0 lb

## 2023-12-08 DIAGNOSIS — E785 Hyperlipidemia, unspecified: Secondary | ICD-10-CM

## 2023-12-08 DIAGNOSIS — R002 Palpitations: Secondary | ICD-10-CM | POA: Diagnosis not present

## 2023-12-08 DIAGNOSIS — G459 Transient cerebral ischemic attack, unspecified: Secondary | ICD-10-CM

## 2023-12-08 DIAGNOSIS — I10 Essential (primary) hypertension: Secondary | ICD-10-CM

## 2023-12-08 NOTE — Patient Instructions (Signed)
 Medication Instructions:  Your physician recommends that you continue on your current medications as directed. Please refer to the Current Medication list given to you today.  *If you need a refill on your cardiac medications before your next appointment, please call your pharmacy*  Lab Work: NONE If you have labs (blood work) drawn today and your tests are completely normal, you will receive your results only by: MyChart Message (if you have MyChart) OR A paper copy in the mail If you have any lab test that is abnormal or we need to change your treatment, we will call you to review the results.  Testing/Procedures: NONE  Follow-Up: At [facility] you and your health needs are our priority.  As part of our continuing mission to provide you with exceptional heart care, our providers are all part of one team.  This team includes your primary Cardiologist (physician) and Advanced Practice Providers or APPs (Physician Assistants and Nurse Practitioners) who all work together to provide you with the care you need, when you need it.  Your next appointment:   2 year(s)  Provider:   Eilleen Grates, MD    We recommend signing up for the patient portal called "MyChart".  Sign up information is provided on this After Visit Summary.  MyChart is used to connect with patients for Virtual Visits (Telemedicine).  Patients are able to view lab/test results, encounter notes, upcoming appointments, etc.  Non-urgent messages can be sent to your provider as well.   To learn more about what you can do with MyChart, go to [email or web address].

## 2024-01-11 ENCOUNTER — Ambulatory Visit: Payer: Self-pay

## 2024-01-11 NOTE — Telephone Encounter (Signed)
 FYI Only or Action Required?: FYI only for provider: ED advised.  Patient was last seen in primary care on 09/15/2023 by Katie Ronal PARAS, MD.  Called Nurse Triage reporting Fall.  Symptoms began today.  Interventions attempted: Nothing.  Symptoms are: stable.  Triage Disposition: Home Care  Patient/caregiver understands and will follow disposition?: Yes  Copied from CRM [id number]. Topic: Clinical - Red Word Triage >> Jan 11, 2024  3:42 PM Katie Allen wrote: Red Word that prompted transfer to Nurse Triage: Patient states that she just fall and hit her head on concrete/ have a lump on head Reason for Disposition  [1] Recent fall AND [2] no injury  Answer Assessment - Initial Assessment Questions Pt called in, started off call by stating she was not going to ED or UC, but wanted her PCP to be aware d/t being on Plavix  and hx of blood clot. She states she was putting up christmas lights and fell off a ladder while 2 steps up. She reports she did hit her head and fell on her R side also bruising her elbow. She denies dizziness or LOC or headache. Pt states she checked her pupils and no difference in size. Pt currently driving, no audible distress. Pt educated on fall safety and importance of getting checked out via imaging d/t fall and hx. Pt voiced understanding but not going to ED. Please advise.    1. MECHANISM: How did the fall happen?     In the last 30 minutes; putting up christmas lights and fell off a ladder 2 steps up.  3. ONSET: When did the fall happen? (e.g., minutes, hours, or days ago)     Today, 30  minutes   4. LOCATION: What part of the body hit the ground? (e.g., back, buttocks, head, hips, knees, hands, head, stomach)     Fell off a ladder 2 steps up; hit her R side and her head off the ground   5. INJURY: Did you hurt (injure) yourself when you fell? If Yes, ask: What did you injure? Tell me more about this? (e.g., body area; type of injury; pain severity)     Lump on head and bruise to R elbow   6. PAIN: Is there any pain? If Yes, ask: How bad is the pain? (e.g., Scale 0-10; or none, mild,      Denies pain to head; minimal pain to elbow   7. SIZE: For cuts, bruises, or swelling, ask: How large is it? (e.g., inches or centimeters)      Bruising to elbow   9. OTHER SYMPTOMS: Do you have any other symptoms? (e.g., dizziness, fever, weakness; new-onset or worsening).      None   10. CAUSE: What do you think caused the fall (or falling)? (e.g., dizzy spell, tripped)       Slipped; Denies LOC. No dizziness or h/a  Protocols used: Falls and [facility]

## 2024-01-24 DIAGNOSIS — Z85828 Personal history of other malignant neoplasm of skin: Secondary | ICD-10-CM | POA: Diagnosis not present

## 2024-01-24 DIAGNOSIS — D225 Melanocytic nevi of trunk: Secondary | ICD-10-CM | POA: Diagnosis not present

## 2024-01-24 DIAGNOSIS — L814 Other melanin hyperpigmentation: Secondary | ICD-10-CM | POA: Diagnosis not present

## 2024-01-24 DIAGNOSIS — L578 Other skin changes due to chronic exposure to nonionizing radiation: Secondary | ICD-10-CM | POA: Diagnosis not present

## 2024-01-24 DIAGNOSIS — B351 Tinea unguium: Secondary | ICD-10-CM | POA: Diagnosis not present

## 2024-01-24 DIAGNOSIS — L570 Actinic keratosis: Secondary | ICD-10-CM | POA: Diagnosis not present

## 2024-01-24 DIAGNOSIS — L821 Other seborrheic keratosis: Secondary | ICD-10-CM | POA: Diagnosis not present

## 2024-01-24 DIAGNOSIS — Z411 Encounter for cosmetic surgery: Secondary | ICD-10-CM | POA: Diagnosis not present

## 2024-02-06 ENCOUNTER — Ambulatory Visit
Admission: RE | Admit: 2024-02-06 | Discharge: 2024-02-06 | Disposition: A | Source: Ambulatory Visit | Attending: Internal Medicine | Admitting: Internal Medicine

## 2024-02-06 DIAGNOSIS — Z1231 Encounter for screening mammogram for malignant neoplasm of breast: Secondary | ICD-10-CM

## 2024-05-10 ENCOUNTER — Other Ambulatory Visit: Payer: Self-pay

## 2024-05-17 ENCOUNTER — Other Ambulatory Visit

## 2024-05-17 ENCOUNTER — Ambulatory Visit: Payer: Self-pay | Admitting: Internal Medicine

## 2024-05-18 ENCOUNTER — Ambulatory Visit: Admitting: Internal Medicine
